# Patient Record
Sex: Female | Born: 1945 | Race: Black or African American | Hispanic: No | Smoking: Former smoker
Health system: Southern US, Community
[De-identification: ages and names within clinical notes are randomized; demographics above are authoritative.]

## PROBLEM LIST (undated history)

## (undated) DIAGNOSIS — I8393 Asymptomatic varicose veins of bilateral lower extremities: Secondary | ICD-10-CM

## (undated) DIAGNOSIS — I10 Essential (primary) hypertension: Secondary | ICD-10-CM

## (undated) DIAGNOSIS — W19XXXA Unspecified fall, initial encounter: Secondary | ICD-10-CM

## (undated) DIAGNOSIS — I7300 Raynaud's syndrome without gangrene: Secondary | ICD-10-CM

## (undated) DIAGNOSIS — M2550 Pain in unspecified joint: Secondary | ICD-10-CM

## (undated) DIAGNOSIS — E538 Deficiency of other specified B group vitamins: Secondary | ICD-10-CM

## (undated) DIAGNOSIS — M797 Fibromyalgia: Secondary | ICD-10-CM

## (undated) DIAGNOSIS — M549 Dorsalgia, unspecified: Secondary | ICD-10-CM

## (undated) DIAGNOSIS — E559 Vitamin D deficiency, unspecified: Secondary | ICD-10-CM

## (undated) DIAGNOSIS — G4730 Sleep apnea, unspecified: Secondary | ICD-10-CM

## (undated) DIAGNOSIS — R296 Repeated falls: Secondary | ICD-10-CM

## (undated) DIAGNOSIS — M2540 Effusion, unspecified joint: Secondary | ICD-10-CM

## (undated) DIAGNOSIS — M2560 Stiffness of unspecified joint, not elsewhere classified: Secondary | ICD-10-CM

## (undated) HISTORY — DX: Sleep apnea, unspecified: G47.30

## (undated) HISTORY — DX: Vitamin D deficiency, unspecified: E55.9

## (undated) HISTORY — DX: Pain in unspecified joint: M25.50

## (undated) HISTORY — DX: Effusion, unspecified joint: M25.40

## (undated) HISTORY — DX: Asymptomatic varicose veins of bilateral lower extremities: I83.93

## (undated) HISTORY — DX: Stiffness of unspecified joint, not elsewhere classified: M25.60

## (undated) HISTORY — PX: BREAST EXCISIONAL BIOPSY: SUR124

## (undated) HISTORY — DX: Dorsalgia, unspecified: M54.9

## (undated) HISTORY — DX: Repeated falls: R29.6

## (undated) HISTORY — DX: Fibromyalgia: M79.7

## (undated) HISTORY — DX: Deficiency of other specified B group vitamins: E53.8

## (undated) HISTORY — PX: FRACTURE SURGERY: SHX138

## (undated) HISTORY — PX: CARPAL TUNNEL RELEASE: SHX101

## (undated) HISTORY — PX: FOOT FRACTURE SURGERY: SHX645

## (undated) HISTORY — DX: Essential (primary) hypertension: I10

## (undated) HISTORY — DX: Unspecified fall, initial encounter: W19.XXXA

## (undated) HISTORY — PX: TUBAL LIGATION: SHX77

## (undated) HISTORY — DX: Raynaud's syndrome without gangrene: I73.00

## (undated) HISTORY — PX: BREAST BIOPSY: SHX20

---

## 1998-08-21 ENCOUNTER — Other Ambulatory Visit: Admission: RE | Admit: 1998-08-21 | Discharge: 1998-08-21 | Payer: Self-pay | Admitting: Obstetrics & Gynecology

## 1998-12-15 ENCOUNTER — Encounter: Payer: Self-pay | Admitting: Obstetrics & Gynecology

## 1998-12-15 ENCOUNTER — Ambulatory Visit (HOSPITAL_COMMUNITY): Admission: RE | Admit: 1998-12-15 | Discharge: 1998-12-15 | Payer: Self-pay | Admitting: Obstetrics & Gynecology

## 2000-08-01 ENCOUNTER — Other Ambulatory Visit: Admission: RE | Admit: 2000-08-01 | Discharge: 2000-08-01 | Payer: Self-pay | Admitting: Obstetrics & Gynecology

## 2000-08-13 ENCOUNTER — Encounter: Payer: Self-pay | Admitting: Obstetrics & Gynecology

## 2000-08-13 ENCOUNTER — Ambulatory Visit (HOSPITAL_COMMUNITY): Admission: RE | Admit: 2000-08-13 | Discharge: 2000-08-13 | Payer: Self-pay | Admitting: Obstetrics & Gynecology

## 2001-10-13 ENCOUNTER — Other Ambulatory Visit: Admission: RE | Admit: 2001-10-13 | Discharge: 2001-10-13 | Payer: Self-pay | Admitting: Obstetrics and Gynecology

## 2003-08-16 ENCOUNTER — Other Ambulatory Visit: Admission: RE | Admit: 2003-08-16 | Discharge: 2003-08-16 | Payer: Self-pay | Admitting: Obstetrics and Gynecology

## 2003-08-19 ENCOUNTER — Encounter: Payer: Self-pay | Admitting: Obstetrics and Gynecology

## 2003-08-19 ENCOUNTER — Ambulatory Visit (HOSPITAL_COMMUNITY): Admission: RE | Admit: 2003-08-19 | Discharge: 2003-08-19 | Payer: Self-pay | Admitting: Obstetrics and Gynecology

## 2003-09-20 ENCOUNTER — Encounter (INDEPENDENT_AMBULATORY_CARE_PROVIDER_SITE_OTHER): Payer: Self-pay

## 2003-09-20 ENCOUNTER — Ambulatory Visit (HOSPITAL_COMMUNITY): Admission: RE | Admit: 2003-09-20 | Discharge: 2003-09-20 | Payer: Self-pay | Admitting: Gastroenterology

## 2005-02-11 ENCOUNTER — Encounter: Admission: RE | Admit: 2005-02-11 | Discharge: 2005-02-11 | Payer: Self-pay | Admitting: Internal Medicine

## 2005-09-04 ENCOUNTER — Other Ambulatory Visit: Admission: RE | Admit: 2005-09-04 | Discharge: 2005-09-04 | Payer: Self-pay | Admitting: Obstetrics and Gynecology

## 2005-09-25 ENCOUNTER — Encounter: Admission: RE | Admit: 2005-09-25 | Discharge: 2005-09-25 | Payer: Self-pay | Admitting: Obstetrics and Gynecology

## 2005-11-18 HISTORY — PX: KNEE SURGERY: SHX244

## 2006-10-15 ENCOUNTER — Other Ambulatory Visit: Admission: RE | Admit: 2006-10-15 | Discharge: 2006-10-15 | Payer: Self-pay | Admitting: Obstetrics and Gynecology

## 2007-11-30 ENCOUNTER — Encounter: Admission: RE | Admit: 2007-11-30 | Discharge: 2007-11-30 | Payer: Self-pay | Admitting: Obstetrics and Gynecology

## 2009-06-27 ENCOUNTER — Encounter: Admission: RE | Admit: 2009-06-27 | Discharge: 2009-06-27 | Payer: Self-pay | Admitting: Orthopedic Surgery

## 2009-06-29 ENCOUNTER — Encounter: Admission: RE | Admit: 2009-06-29 | Discharge: 2009-06-29 | Payer: Self-pay | Admitting: Orthopedic Surgery

## 2010-12-09 ENCOUNTER — Encounter: Payer: Self-pay | Admitting: Obstetrics and Gynecology

## 2011-04-05 NOTE — Op Note (Signed)
[name], [name]                       ACCOUNT NO.:  [id number]   MEDICAL RECORD NO.:  [id number]                   PATIENT TYPE:  AMB   LOCATION:  ENDO                                 FACILITY:  [facility]   PHYSICIAN:  [name], M.D.                 DATE OF BIRTH:  Jan 12, 1946   DATE OF PROCEDURE:  09/20/2003  DATE OF DISCHARGE:                                 OPERATIVE REPORT   PROCEDURE:  Colonoscopy.   INDICATIONS:  Family history of colon polyps.  Due for colonic screening.  Consent was signed after risks, benefits, methods, and options were  thoroughly discussed in the office.   PREMEDICATIONS:  Demerol 80 mg, Versed 8 mg.   DESCRIPTION OF PROCEDURE:  Rectal inspection pertinent for external  hemorrhoids, small.  Digital exam was negative.  Video pediatric adjustable  colonoscope was inserted and easily advanced around the colon to the cecum.  This did require some abdominal pressure but no position changes.  No  obvious abnormality was seen on insertion.  In the cecum small pedunculated  polyp was seen and snared.  Electrocautery was applied and the polyp was  suctioned through the scope and collected in the trap.  Electrocautery was  set on 15/15.  We did advance a short ways into the terminal ileum which was  normal.  Scope was slowly withdrawn.  The prep was adequate.  There was some  liquid stool that required washing and suctioning.  On slow withdrawal  through the colon, another tiny distal sigmoid, probably hyperplastic-  appearing polyp, which was cold biopsied x1, and put in a separate  container.  No other abnormalities were seen. Anal rectal polyps on  retroflexion confirmed some small hemorrhoids.  The scope was reinserted a  short ways up the left side of the colon.  Air was suctioned and the scope  removed.  The patient tolerated the procedure well.  There was no obvious  immediate complication.   ENDOSCOPIC DIAGNOSES:  1. Internal and external  hemorrhoids.  2. Tiny distal sigmoid, probably hyperplastic-appearing, polyp cold     biopsied.  3. Cecal small polyp snared on a setting of 15/15.  4. Otherwise within normal limits to the end of the terminal ileum.    PLAN:  Await pathology to determine the future of colonic screening.  Happy  to see back p.r.n.  Otherwise return care to Dr. Renae Gordon and Dr. Pennie Gordon for  the customary health care screening and maintenance to include yearly  rectals and guaiacs.                                               [name], M.D.    MEM/MEDQ  D:  09/20/2003  T:  09/20/2003  Job:  [id number]   cc:  [name], M.D.  [address], [address]  [place]  [place] [address]  Fax: [phone]   [name]. [name], M.D.  [address]  [address]  [place]  [place] [address]  Fax: [phone]

## 2014-03-19 ENCOUNTER — Ambulatory Visit (INDEPENDENT_AMBULATORY_CARE_PROVIDER_SITE_OTHER): Payer: Medicare HMO | Admitting: Emergency Medicine

## 2014-03-19 VITALS — BP 125/95 | HR 72 | Temp 97.9°F | Resp 18

## 2014-03-19 DIAGNOSIS — L743 Miliaria, unspecified: Secondary | ICD-10-CM

## 2014-03-19 DIAGNOSIS — L740 Miliaria rubra: Secondary | ICD-10-CM

## 2014-03-19 MED ORDER — TRIAMCINOLONE ACETONIDE 0.1 % EX CREA: 1.0000 "application " | TOPICAL_CREAM | Freq: Two times a day (BID) | CUTANEOUS | Status: DC

## 2014-03-19 NOTE — Patient Instructions (Signed)
Sunburn Sunburn is damage to the skin caused by overexposure to ultraviolet (UV) rays. People with light skin or a fair complexion may be more susceptible to sunburn. Repeated sun exposure causes early skin aging such as wrinkles and sun spots. It also increases the risk of skin cancer. CAUSES A sunburn is caused by getting too much UV radiation from the sun. SYMPTOMS  Red or pink skin.  Soreness and swelling.  Pain.  Blisters.  Peeling skin.  Headache, fever, and fatigue if sunburn covers a large area. TREATMENT  Your caregiver may tell you to take certain medicines to lessen inflammation.  Your caregiver may have you use hydrocortisone cream or spray to help with itching and inflammation.  Your caregiver may prescribe an antibiotic cream to use on blisters. HOME CARE INSTRUCTIONS   Avoid further exposure to the sun.  Cool baths and cool compresses may be helpful if used several times per day. Do not apply ice, since this may result in more damage to the skin.  Only take over-the-counter or prescription medicines for pain, discomfort, or fever as directed by your caregiver.  Use aloe or other over-the-counter sunburn creams or gels on your skin. Do not apply these creams or gels on blisters.  Drink enough fluids to keep your urine clear or pale yellow.  Do not break blisters. If blisters break, your caregiver may recommend an antibiotic cream to apply to the affected area. PREVENTION   Try to avoid the sun between 10:00 a.m. and 4:00 p.m. when it is the strongest.  Apply sunscreen at least 30 minutes before exposure to the sun.  Always wear protective hats, clothing, and sunglasses with UV protection.  Avoid medicines, herbs, and foods that increase your sensitivity to sunlight.  Avoid tanning beds. SEEK IMMEDIATE MEDICAL CARE IF:   You have a fever.  Your pain is uncontrolled with medicine.  You start to vomit or have diarrhea.  You feel faint or develop a  headache with confusion.  You develop severe blistering.  You have a pus-like (purulent) discharge coming from the blisters.  Your burn becomes more painful and swollen. MAKE SURE YOU:  Understand these instructions.  Will watch your condition.  Will get help right away if you are not doing well or get worse. Document Released: 08/14/2005 Document Revised: 03/01/2013 Document Reviewed: 04/28/2011 ExitCare Patient Information 2014 [employer].  

## 2014-03-19 NOTE — Progress Notes (Signed)
[facility] [address] [place] [address] [phone]  Date:  03/19/2014   Name:  [name]   DOB:  Jul 09, 1946   MRN:  [id number]  PCP:  No primary provider on file.    Chief Complaint: Rash and PT REFUSED TO BE WEIGHED   History of Present Illness:  [name] is a 68 y.o. very pleasant female patient who presents with the following:  Just returned from [place] on vacation.  Pretty badly sunburned while there.  Now to office with a rash that she developed while walking today.  Initially vesicular and pruritic.  No respiratory symptoms. No history of prior atopy.  Improved with benadryl.  No improvement with over the counter medications or other home remedies. Denies other complaint or health concern today.   There are no active problems to display for this patient.   History reviewed. No pertinent past medical history.  Past Surgical History  Procedure Laterality Date  . Tubal ligation      History  Substance Use Topics  . Smoking status: Former Research scientist (life sciences)  . Smokeless tobacco: Not on file  . Alcohol Use: Yes    Family History  Problem Relation Age of Onset  . Hypertension Mother   . Cancer Father   . Diabetes Brother     No Known Allergies  Medication list has been reviewed and updated.  No current outpatient prescriptions on file prior to visit.   No current facility-administered medications on file prior to visit.    Review of Systems:  As per HPI, otherwise negative.    Physical Examination: Filed Vitals:   03/19/14 1549  BP: 125/95  Pulse: 72  Temp: 97.9 F (36.6 C)  Resp: 18   There were no vitals filed for this visit. There is no height or weight on file to calculate BMI. Ideal Body Weight:     GEN: WDWN, NAD, Non-toxic, Alert & Oriented x 3 HEENT: Atraumatic, Normocephalic.  Ears and Nose: No external deformity. EXTR: No clubbing/cyanosis/edema NEURO: Normal gait.  PSYCH: Normally  interactive. Conversant. Not depressed or anxious appearing.  Calm demeanor.  SKIN:  Resolving sunburn shoulders and a fine vesicular eruption of discrete small lesions.     Assessment and Plan: Miliaria TAC Benadryl   Signed,  Ellison Carwin, MD

## 2015-07-12 ENCOUNTER — Other Ambulatory Visit: Payer: Self-pay

## 2015-07-12 ENCOUNTER — Other Ambulatory Visit: Payer: Self-pay | Admitting: Family

## 2015-07-12 ENCOUNTER — Ambulatory Visit
Admission: RE | Admit: 2015-07-12 | Discharge: 2015-07-12 | Disposition: A | Discharge status: Home / Self Care | Payer: Medicare HMO | Source: Ambulatory Visit | Attending: Family | Admitting: Family

## 2015-07-12 DIAGNOSIS — Z1231 Encounter for screening mammogram for malignant neoplasm of breast: Secondary | ICD-10-CM

## 2015-07-12 DIAGNOSIS — M25441 Effusion, right hand: Secondary | ICD-10-CM

## 2015-08-16 ENCOUNTER — Ambulatory Visit: Payer: Self-pay

## 2015-09-06 ENCOUNTER — Ambulatory Visit: Payer: Self-pay

## 2015-09-06 ENCOUNTER — Ambulatory Visit
Admission: RE | Admit: 2015-09-06 | Discharge: 2015-09-06 | Disposition: A | Discharge status: Home / Self Care | Payer: Medicare HMO | Source: Ambulatory Visit

## 2015-09-06 DIAGNOSIS — Z1231 Encounter for screening mammogram for malignant neoplasm of breast: Secondary | ICD-10-CM

## 2015-09-21 DIAGNOSIS — Z008 Encounter for other general examination: Secondary | ICD-10-CM | POA: Diagnosis not present

## 2015-12-18 DIAGNOSIS — E6609 Other obesity due to excess calories: Secondary | ICD-10-CM | POA: Diagnosis not present

## 2015-12-18 DIAGNOSIS — Z6832 Body mass index (BMI) 32.0-32.9, adult: Secondary | ICD-10-CM | POA: Diagnosis not present

## 2015-12-18 DIAGNOSIS — L298 Other pruritus: Secondary | ICD-10-CM | POA: Diagnosis not present

## 2015-12-18 DIAGNOSIS — H01111 Allergic dermatitis of right upper eyelid: Secondary | ICD-10-CM | POA: Diagnosis not present

## 2016-01-15 DIAGNOSIS — Z23 Encounter for immunization: Secondary | ICD-10-CM | POA: Diagnosis not present

## 2016-01-15 DIAGNOSIS — R030 Elevated blood-pressure reading, without diagnosis of hypertension: Secondary | ICD-10-CM | POA: Diagnosis not present

## 2016-01-15 DIAGNOSIS — R635 Abnormal weight gain: Secondary | ICD-10-CM | POA: Diagnosis not present

## 2016-01-15 DIAGNOSIS — Z79899 Other long term (current) drug therapy: Secondary | ICD-10-CM | POA: Diagnosis not present

## 2016-01-25 DIAGNOSIS — L821 Other seborrheic keratosis: Secondary | ICD-10-CM | POA: Diagnosis not present

## 2016-01-25 DIAGNOSIS — H019 Unspecified inflammation of eyelid: Secondary | ICD-10-CM | POA: Diagnosis not present

## 2016-01-25 DIAGNOSIS — L309 Dermatitis, unspecified: Secondary | ICD-10-CM | POA: Diagnosis not present

## 2016-01-25 DIAGNOSIS — H578 Other specified disorders of eye and adnexa: Secondary | ICD-10-CM | POA: Diagnosis not present

## 2016-05-26 DIAGNOSIS — L821 Other seborrheic keratosis: Secondary | ICD-10-CM | POA: Insufficient documentation

## 2016-09-25 DIAGNOSIS — I10 Essential (primary) hypertension: Secondary | ICD-10-CM | POA: Diagnosis not present

## 2016-09-25 DIAGNOSIS — G43909 Migraine, unspecified, not intractable, without status migrainosus: Secondary | ICD-10-CM | POA: Diagnosis not present

## 2016-09-30 DIAGNOSIS — R030 Elevated blood-pressure reading, without diagnosis of hypertension: Secondary | ICD-10-CM | POA: Diagnosis not present

## 2016-09-30 DIAGNOSIS — M25512 Pain in left shoulder: Secondary | ICD-10-CM | POA: Diagnosis not present

## 2016-09-30 DIAGNOSIS — M79641 Pain in right hand: Secondary | ICD-10-CM | POA: Diagnosis not present

## 2016-12-06 DIAGNOSIS — R202 Paresthesia of skin: Secondary | ICD-10-CM | POA: Diagnosis not present

## 2016-12-06 DIAGNOSIS — M25512 Pain in left shoulder: Secondary | ICD-10-CM | POA: Diagnosis not present

## 2016-12-06 DIAGNOSIS — M25531 Pain in right wrist: Secondary | ICD-10-CM | POA: Diagnosis not present

## 2016-12-06 DIAGNOSIS — R030 Elevated blood-pressure reading, without diagnosis of hypertension: Secondary | ICD-10-CM | POA: Diagnosis not present

## 2016-12-25 DIAGNOSIS — M25531 Pain in right wrist: Secondary | ICD-10-CM | POA: Diagnosis not present

## 2016-12-30 DIAGNOSIS — G629 Polyneuropathy, unspecified: Secondary | ICD-10-CM | POA: Diagnosis not present

## 2016-12-30 DIAGNOSIS — I10 Essential (primary) hypertension: Secondary | ICD-10-CM | POA: Diagnosis not present

## 2016-12-30 DIAGNOSIS — H2511 Age-related nuclear cataract, right eye: Secondary | ICD-10-CM | POA: Diagnosis not present

## 2016-12-30 DIAGNOSIS — Z0000 Encounter for general adult medical examination without abnormal findings: Secondary | ICD-10-CM | POA: Diagnosis not present

## 2017-05-05 DIAGNOSIS — R7309 Other abnormal glucose: Secondary | ICD-10-CM | POA: Diagnosis not present

## 2017-05-05 DIAGNOSIS — E2839 Other primary ovarian failure: Secondary | ICD-10-CM | POA: Diagnosis not present

## 2017-05-05 DIAGNOSIS — Z1389 Encounter for screening for other disorder: Secondary | ICD-10-CM | POA: Diagnosis not present

## 2017-05-05 DIAGNOSIS — Z01419 Encounter for gynecological examination (general) (routine) without abnormal findings: Secondary | ICD-10-CM | POA: Diagnosis not present

## 2017-05-05 DIAGNOSIS — E559 Vitamin D deficiency, unspecified: Secondary | ICD-10-CM | POA: Diagnosis not present

## 2017-05-05 DIAGNOSIS — Z0000 Encounter for general adult medical examination without abnormal findings: Secondary | ICD-10-CM | POA: Diagnosis not present

## 2017-05-05 DIAGNOSIS — M2550 Pain in unspecified joint: Secondary | ICD-10-CM | POA: Diagnosis not present

## 2017-05-05 DIAGNOSIS — Z1212 Encounter for screening for malignant neoplasm of rectum: Secondary | ICD-10-CM | POA: Diagnosis not present

## 2017-05-09 ENCOUNTER — Other Ambulatory Visit: Payer: Self-pay | Admitting: Internal Medicine

## 2017-05-09 DIAGNOSIS — E2839 Other primary ovarian failure: Secondary | ICD-10-CM

## 2017-05-09 DIAGNOSIS — Z1231 Encounter for screening mammogram for malignant neoplasm of breast: Secondary | ICD-10-CM

## 2017-05-16 ENCOUNTER — Telehealth (HOSPITAL_COMMUNITY): Payer: Self-pay | Admitting: *Deleted

## 2017-05-20 ENCOUNTER — Ambulatory Visit
Admission: RE | Admit: 2017-05-20 | Discharge: 2017-05-20 | Disposition: A | Discharge status: Home / Self Care | Payer: Medicare HMO | Source: Ambulatory Visit | Attending: Internal Medicine | Admitting: Internal Medicine

## 2017-05-20 DIAGNOSIS — M85852 Other specified disorders of bone density and structure, left thigh: Secondary | ICD-10-CM | POA: Diagnosis not present

## 2017-05-20 DIAGNOSIS — E2839 Other primary ovarian failure: Secondary | ICD-10-CM

## 2017-05-20 DIAGNOSIS — Z1231 Encounter for screening mammogram for malignant neoplasm of breast: Secondary | ICD-10-CM | POA: Diagnosis not present

## 2017-05-20 DIAGNOSIS — Z780 Asymptomatic menopausal state: Secondary | ICD-10-CM | POA: Diagnosis not present

## 2017-05-26 ENCOUNTER — Other Ambulatory Visit: Payer: Self-pay

## 2017-05-26 DIAGNOSIS — I83813 Varicose veins of bilateral lower extremities with pain: Secondary | ICD-10-CM

## 2017-05-28 DIAGNOSIS — M79673 Pain in unspecified foot: Secondary | ICD-10-CM | POA: Diagnosis not present

## 2017-05-28 DIAGNOSIS — E2839 Other primary ovarian failure: Secondary | ICD-10-CM | POA: Diagnosis not present

## 2017-05-28 DIAGNOSIS — E559 Vitamin D deficiency, unspecified: Secondary | ICD-10-CM | POA: Diagnosis not present

## 2017-05-28 DIAGNOSIS — M5432 Sciatica, left side: Secondary | ICD-10-CM | POA: Diagnosis not present

## 2017-06-05 DIAGNOSIS — Z86010 Personal history of colonic polyps: Secondary | ICD-10-CM | POA: Diagnosis not present

## 2017-06-05 DIAGNOSIS — D126 Benign neoplasm of colon, unspecified: Secondary | ICD-10-CM | POA: Diagnosis not present

## 2017-06-06 ENCOUNTER — Encounter: Payer: Self-pay | Admitting: Vascular Surgery

## 2017-06-09 DIAGNOSIS — D126 Benign neoplasm of colon, unspecified: Secondary | ICD-10-CM | POA: Diagnosis not present

## 2017-06-10 ENCOUNTER — Ambulatory Visit (HOSPITAL_COMMUNITY)
Admission: RE | Admit: 2017-06-10 | Discharge: 2017-06-10 | Disposition: A | Discharge status: Home / Self Care | Payer: Medicare HMO | Source: Ambulatory Visit | Attending: Vascular Surgery | Admitting: Vascular Surgery

## 2017-06-10 DIAGNOSIS — I8391 Asymptomatic varicose veins of right lower extremity: Secondary | ICD-10-CM | POA: Diagnosis not present

## 2017-06-10 DIAGNOSIS — I83813 Varicose veins of bilateral lower extremities with pain: Secondary | ICD-10-CM | POA: Diagnosis not present

## 2017-06-16 ENCOUNTER — Encounter: Payer: Self-pay | Admitting: Vascular Surgery

## 2017-06-16 ENCOUNTER — Ambulatory Visit (INDEPENDENT_AMBULATORY_CARE_PROVIDER_SITE_OTHER): Payer: Medicare HMO | Admitting: Vascular Surgery

## 2017-06-16 VITALS — BP 133/82 | HR 77 | Temp 97.3°F | Resp 16

## 2017-06-16 DIAGNOSIS — I83893 Varicose veins of bilateral lower extremities with other complications: Secondary | ICD-10-CM

## 2017-06-16 NOTE — Progress Notes (Signed)
Subjective:     Patient ID: [name], female   DOB: 05-09-1946, 71 y.o.   MRN: [id number]  HPI This 71 year old female was referred by Dr. Bryon Lions for evaluation of bilateral varicose veins. The patient has had "spider veins" for many years which have become slightly more extensive. She has areas of hypersensitivity particularly in the left distal lateral leg. She did suffer a significant ankle and foot fracture in this area many years ago. She has no history of known DVT thrombophlebitis stasis ulcers or bleeding. She does develop some mild swelling as the day progresses. She also develops aching discomfort in both legs in both legs at night are restless. She does not elastic compression stockings.  Past Medical History:  Diagnosis Date  . Varicose veins of both lower extremities     Social History  Substance Use Topics  . Smoking status: Former Research scientist (life sciences)  . Smokeless tobacco: Never Used  . Alcohol use Yes    Family History  Problem Relation Age of Onset  . Hypertension Mother   . Cancer Father   . Diabetes Brother   . Breast cancer Paternal Aunt   . Breast cancer Paternal Grandmother     No Known Allergies   Current Outpatient Prescriptions:  .  triamcinolone cream (KENALOG) 0.1 %, Apply 1 application topically 2 (two) times daily., Disp: 60 g, Rfl: 0  Vitals:   06/16/17 1016  BP: 133/82  Pulse: 77  Resp: 16  Temp: (!) 97.3 F (36.3 C)  SpO2: 99%    There is no height or weight on file to calculate BMI.         Review of Systems Denies chest pain, dyspnea on exertion, PND, orthopnea, hemoptysis, claudication.    Objective:   Physical Exam BP 133/82 (BP Location: Left Arm, Patient Position: Sitting, Cuff Size: Large)   Pulse 77   Temp (!) 97.3 F (36.3 C)   Resp 16   SpO2 99%     Gen.-alert and oriented x3 in no apparent distress HEENT normal for age Lungs no rhonchi or wheezing Cardiovascular regular rhythm no murmurs carotid pulses 3+  palpable no bruits audible Abdomen soft nontender no palpable masses Musculoskeletal free of  major deformities Skin clear -no rashes Neurologic normal Lower extremities 3+ femoral and dorsalis pedis pulses palpable bilaterally with no edema Scattered spider veins bilaterally particularly in the distal lateral and posterior thigh areas and in the popliteal space. No hyperpigmentation or ulceration noted distally. No bulging varicosities noted.  Today I reviewed the ultrasound study performed in our office which was a reflux examination on 06/10/2017. There is no DVT or deep vein reflux. The superficial system looks essentially normal.       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 I discussed this at length with the patient If she is interested in foam sclerotherapy for the spider veins she will contact Ursula Beath gave her contact information to the patient today Otherwise return on a when necessary basis

## 2017-09-05 DIAGNOSIS — M25532 Pain in left wrist: Secondary | ICD-10-CM | POA: Diagnosis not present

## 2017-09-05 DIAGNOSIS — M25512 Pain in left shoulder: Secondary | ICD-10-CM | POA: Diagnosis not present

## 2017-09-05 DIAGNOSIS — M25571 Pain in right ankle and joints of right foot: Secondary | ICD-10-CM | POA: Diagnosis not present

## 2017-09-05 DIAGNOSIS — M25562 Pain in left knee: Secondary | ICD-10-CM | POA: Diagnosis not present

## 2017-09-05 DIAGNOSIS — M25572 Pain in left ankle and joints of left foot: Secondary | ICD-10-CM | POA: Diagnosis not present

## 2017-09-05 DIAGNOSIS — M79602 Pain in left arm: Secondary | ICD-10-CM | POA: Diagnosis not present

## 2017-09-05 DIAGNOSIS — M25561 Pain in right knee: Secondary | ICD-10-CM | POA: Diagnosis not present

## 2017-09-06 DIAGNOSIS — S5002XA Contusion of left elbow, initial encounter: Secondary | ICD-10-CM | POA: Diagnosis not present

## 2017-09-06 DIAGNOSIS — S82831A Other fracture of upper and lower end of right fibula, initial encounter for closed fracture: Secondary | ICD-10-CM | POA: Diagnosis not present

## 2017-09-08 DIAGNOSIS — M545 Low back pain: Secondary | ICD-10-CM | POA: Diagnosis not present

## 2017-09-08 DIAGNOSIS — M25562 Pain in left knee: Secondary | ICD-10-CM | POA: Diagnosis not present

## 2017-09-08 DIAGNOSIS — S82831A Other fracture of upper and lower end of right fibula, initial encounter for closed fracture: Secondary | ICD-10-CM | POA: Diagnosis not present

## 2017-09-08 DIAGNOSIS — M25561 Pain in right knee: Secondary | ICD-10-CM | POA: Diagnosis not present

## 2017-09-10 DIAGNOSIS — S8291XS Unspecified fracture of right lower leg, sequela: Secondary | ICD-10-CM | POA: Diagnosis not present

## 2017-09-10 DIAGNOSIS — M25562 Pain in left knee: Secondary | ICD-10-CM | POA: Diagnosis not present

## 2017-09-10 DIAGNOSIS — M25512 Pain in left shoulder: Secondary | ICD-10-CM | POA: Diagnosis not present

## 2017-09-10 DIAGNOSIS — M25571 Pain in right ankle and joints of right foot: Secondary | ICD-10-CM | POA: Diagnosis not present

## 2017-09-15 DIAGNOSIS — M25512 Pain in left shoulder: Secondary | ICD-10-CM | POA: Diagnosis not present

## 2017-09-15 DIAGNOSIS — M545 Low back pain: Secondary | ICD-10-CM | POA: Diagnosis not present

## 2017-09-15 DIAGNOSIS — M25562 Pain in left knee: Secondary | ICD-10-CM | POA: Diagnosis not present

## 2017-09-15 DIAGNOSIS — S82831A Other fracture of upper and lower end of right fibula, initial encounter for closed fracture: Secondary | ICD-10-CM | POA: Diagnosis not present

## 2017-09-15 DIAGNOSIS — M25561 Pain in right knee: Secondary | ICD-10-CM | POA: Diagnosis not present

## 2017-09-16 DIAGNOSIS — M25512 Pain in left shoulder: Secondary | ICD-10-CM | POA: Diagnosis not present

## 2017-09-16 DIAGNOSIS — M6281 Muscle weakness (generalized): Secondary | ICD-10-CM | POA: Diagnosis not present

## 2017-09-16 DIAGNOSIS — M25612 Stiffness of left shoulder, not elsewhere classified: Secondary | ICD-10-CM | POA: Diagnosis not present

## 2017-09-22 NOTE — Telephone Encounter (Signed)
error 

## 2017-09-29 DIAGNOSIS — S82831D Other fracture of upper and lower end of right fibula, subsequent encounter for closed fracture with routine healing: Secondary | ICD-10-CM | POA: Diagnosis not present

## 2017-09-30 DIAGNOSIS — M25612 Stiffness of left shoulder, not elsewhere classified: Secondary | ICD-10-CM | POA: Diagnosis not present

## 2017-09-30 DIAGNOSIS — M25512 Pain in left shoulder: Secondary | ICD-10-CM | POA: Diagnosis not present

## 2017-09-30 DIAGNOSIS — M6281 Muscle weakness (generalized): Secondary | ICD-10-CM | POA: Diagnosis not present

## 2017-10-01 DIAGNOSIS — H524 Presbyopia: Secondary | ICD-10-CM | POA: Diagnosis not present

## 2017-10-08 DIAGNOSIS — M25512 Pain in left shoulder: Secondary | ICD-10-CM | POA: Diagnosis not present

## 2017-10-08 DIAGNOSIS — M25612 Stiffness of left shoulder, not elsewhere classified: Secondary | ICD-10-CM | POA: Diagnosis not present

## 2017-10-08 DIAGNOSIS — M6281 Muscle weakness (generalized): Secondary | ICD-10-CM | POA: Diagnosis not present

## 2017-10-28 DIAGNOSIS — M2550 Pain in unspecified joint: Secondary | ICD-10-CM | POA: Diagnosis not present

## 2017-10-28 DIAGNOSIS — M542 Cervicalgia: Secondary | ICD-10-CM | POA: Diagnosis not present

## 2017-10-28 DIAGNOSIS — R030 Elevated blood-pressure reading, without diagnosis of hypertension: Secondary | ICD-10-CM | POA: Diagnosis not present

## 2017-10-28 DIAGNOSIS — L0291 Cutaneous abscess, unspecified: Secondary | ICD-10-CM | POA: Diagnosis not present

## 2017-10-30 DIAGNOSIS — S52135D Nondisplaced fracture of neck of left radius, subsequent encounter for closed fracture with routine healing: Secondary | ICD-10-CM | POA: Diagnosis not present

## 2017-10-30 DIAGNOSIS — M25571 Pain in right ankle and joints of right foot: Secondary | ICD-10-CM | POA: Diagnosis not present

## 2017-11-05 DIAGNOSIS — M25512 Pain in left shoulder: Secondary | ICD-10-CM | POA: Diagnosis not present

## 2017-11-05 DIAGNOSIS — M25622 Stiffness of left elbow, not elsewhere classified: Secondary | ICD-10-CM | POA: Diagnosis not present

## 2017-11-05 DIAGNOSIS — S52182D Other fracture of upper end of left radius, subsequent encounter for closed fracture with routine healing: Secondary | ICD-10-CM | POA: Diagnosis not present

## 2017-11-05 DIAGNOSIS — M25522 Pain in left elbow: Secondary | ICD-10-CM | POA: Diagnosis not present

## 2017-11-19 DIAGNOSIS — M25622 Stiffness of left elbow, not elsewhere classified: Secondary | ICD-10-CM | POA: Diagnosis not present

## 2017-11-19 DIAGNOSIS — M25522 Pain in left elbow: Secondary | ICD-10-CM | POA: Diagnosis not present

## 2017-11-19 DIAGNOSIS — S52182D Other fracture of upper end of left radius, subsequent encounter for closed fracture with routine healing: Secondary | ICD-10-CM | POA: Diagnosis not present

## 2017-11-19 DIAGNOSIS — M25512 Pain in left shoulder: Secondary | ICD-10-CM | POA: Diagnosis not present

## 2017-11-27 DIAGNOSIS — M25512 Pain in left shoulder: Secondary | ICD-10-CM | POA: Diagnosis not present

## 2017-11-27 DIAGNOSIS — S52182D Other fracture of upper end of left radius, subsequent encounter for closed fracture with routine healing: Secondary | ICD-10-CM | POA: Diagnosis not present

## 2017-11-27 DIAGNOSIS — S52135D Nondisplaced fracture of neck of left radius, subsequent encounter for closed fracture with routine healing: Secondary | ICD-10-CM | POA: Diagnosis not present

## 2017-11-27 DIAGNOSIS — M25522 Pain in left elbow: Secondary | ICD-10-CM | POA: Diagnosis not present

## 2017-11-27 DIAGNOSIS — M25622 Stiffness of left elbow, not elsewhere classified: Secondary | ICD-10-CM | POA: Diagnosis not present

## 2017-12-01 DIAGNOSIS — I10 Essential (primary) hypertension: Secondary | ICD-10-CM | POA: Diagnosis not present

## 2017-12-01 DIAGNOSIS — M25571 Pain in right ankle and joints of right foot: Secondary | ICD-10-CM | POA: Diagnosis not present

## 2017-12-01 DIAGNOSIS — G4700 Insomnia, unspecified: Secondary | ICD-10-CM | POA: Diagnosis not present

## 2017-12-01 DIAGNOSIS — M779 Enthesopathy, unspecified: Secondary | ICD-10-CM | POA: Diagnosis not present

## 2017-12-25 DIAGNOSIS — M25572 Pain in left ankle and joints of left foot: Secondary | ICD-10-CM | POA: Diagnosis not present

## 2017-12-25 DIAGNOSIS — M25522 Pain in left elbow: Secondary | ICD-10-CM | POA: Diagnosis not present

## 2017-12-30 DIAGNOSIS — Z79899 Other long term (current) drug therapy: Secondary | ICD-10-CM | POA: Diagnosis not present

## 2018-01-09 DIAGNOSIS — M2042 Other hammer toe(s) (acquired), left foot: Secondary | ICD-10-CM | POA: Diagnosis not present

## 2018-01-09 DIAGNOSIS — M67472 Ganglion, left ankle and foot: Secondary | ICD-10-CM | POA: Diagnosis not present

## 2018-01-09 DIAGNOSIS — M2041 Other hammer toe(s) (acquired), right foot: Secondary | ICD-10-CM | POA: Diagnosis not present

## 2018-01-09 DIAGNOSIS — M67471 Ganglion, right ankle and foot: Secondary | ICD-10-CM | POA: Diagnosis not present

## 2018-01-22 ENCOUNTER — Encounter: Payer: Self-pay | Admitting: Neurology

## 2018-01-22 ENCOUNTER — Ambulatory Visit: Payer: Medicare HMO | Admitting: Neurology

## 2018-01-22 ENCOUNTER — Encounter (INDEPENDENT_AMBULATORY_CARE_PROVIDER_SITE_OTHER): Payer: Self-pay

## 2018-01-22 VITALS — BP 150/96 | HR 76 | Ht 67.0 in | Wt 245.0 lb

## 2018-01-22 DIAGNOSIS — R0683 Snoring: Secondary | ICD-10-CM

## 2018-01-22 DIAGNOSIS — G4730 Sleep apnea, unspecified: Secondary | ICD-10-CM

## 2018-01-22 DIAGNOSIS — R351 Nocturia: Secondary | ICD-10-CM | POA: Diagnosis not present

## 2018-01-22 DIAGNOSIS — E669 Obesity, unspecified: Secondary | ICD-10-CM | POA: Diagnosis not present

## 2018-01-22 DIAGNOSIS — G4710 Hypersomnia, unspecified: Secondary | ICD-10-CM | POA: Diagnosis not present

## 2018-01-22 DIAGNOSIS — M25522 Pain in left elbow: Secondary | ICD-10-CM | POA: Diagnosis not present

## 2018-01-22 DIAGNOSIS — M67912 Unspecified disorder of synovium and tendon, left shoulder: Secondary | ICD-10-CM | POA: Diagnosis not present

## 2018-01-22 NOTE — Patient Instructions (Signed)

## 2018-01-22 NOTE — Progress Notes (Signed)
SLEEP MEDICINE CLINIC   Provider:  Larey Seat, M D  Primary Care Physician:  Glendale Chard, MD   Referring Provider: Glendale Chard, MD    Chief Complaint  Patient presents with  . New Patient (Initial Visit)    pt alone, rm 11 pt is not sleeping through the night she wakes up 2-3 to pee. pt has been snores.     HPI:  [name] is a 72 y.o. female , seen here in a referral from Dr. Baird Cancer for a sleep consult.   She has never undergone a sleep study, and in her last visit with Dr. Baird Cancer mentioned poor sleep quality,  Very loud snoring that has woken her up, and frequent nocturia. Gained weight over the last 6 month, after a fall in October which injured ankle and elbow- and kept her from exercising. She used to walk 5 miles a day .  She has HTN.    Chief complaint according to patient : "[name] reports that her grandson has confirmed that she snores loudly, and he has witnessed this.  She is also concerned about her frequent bathroom breaks.  She has insomnia now,which is a rather new phenomenon.  Sleep habits are as follows: The patient watches the news in her living room she does not have a TV in her bedroom, after the late diagnosis she will be treated to her bedroom at the time may vary between 11 and midnight.  Her bedroom is otherwise cool, quiet and dark.  She starts out on her right side but may find herself in supine position during the night.  She sleeps on one pillow for head support, on a flat mattress. She estimates that she will be up for the first time to go to the bathroom within 2 hours of sleep onset.  She has turned the clock away from view  in her bedroom. She has 3 more bathroom breaks. She recently has begun taking meloxicam and tramadol for her orthopedic injuries, and she has noticed that she is not as easy to wake up at 7 AM as it was her happened before.  She used to wake up spontaneously at the same time every morning now she may sleep  until 8.30 on weekends. She is still in pain.   Sleep medical history and family sleep history:  She fell on the pedestrian sidewalk in downtown [place] , [address], when she fell over an unmarked hose that was dawn over the sidewalk, covered by plywood.  Social history: divorced, adult children- 1 grand son age 8. College educated, Armed forces operational officer.   She works as a Engineer, site. Nonsmoker- smoked for 2 years while in college. ETOH- socially- 2-4 drinks weekends. Caffeine; coffee daily - 2 mugs a day in AM, soda none, tea - herbal, no energy drinks.    Review of Systems: Out of a complete 14 system review, the patient complains of only the following symptoms, and all other reviewed systems are negative. some sudden sleepiness attacks- while driving. Before medication. Snoring- muscle relaxant and tramadol.   Epworth score  4, no naps. , Fatigue severity score 15  , depression score 2/ 15   Social History   Socioeconomic History  . Marital status: Single    Spouse name: Not on file  . Number of children: Not on file  . Years of education: Not on file  . Highest education level: Not on file  Social Needs  . Financial resource strain: Not on  file  . Food insecurity - worry: Not on file  . Food insecurity - inability: Not on file  . Transportation needs - medical: Not on file  . Transportation needs - non-medical: Not on file  Occupational History  . Not on file  Tobacco Use  . Smoking status: Former Research scientist (life sciences)  . Smokeless tobacco: Never Used  . Tobacco comment: quit at age 13,only smoked for 1-2 years  Substance and Sexual Activity  . Alcohol use: Yes    Comment: occasional  . Drug use: No  . Sexual activity: Not on file  Other Topics Concern  . Not on file  Social History Narrative  . Not on file    Family History  Problem Relation Age of Onset  . Hypertension Mother   . Hypertension Father   . Cancer Father   . Diabetes Brother   . Breast cancer Paternal  Aunt   . Breast cancer Paternal Grandmother     Past Medical History:  Diagnosis Date  . Falls   . Hypertension   . Varicose veins of both lower extremities   . Vitamin D deficiency     Past Surgical History:  Procedure Laterality Date  . BREAST BIOPSY Right   . BREAST EXCISIONAL BIOPSY Right   . FOOT FRACTURE SURGERY Right   . TUBAL LIGATION      Current Outpatient Medications  Medication Sig Dispense Refill  . meloxicam (MOBIC) 15 MG tablet Take 15 mg by mouth daily.    . traMADol (ULTRAM) 50 MG tablet Take by mouth every 6 (six) hours as needed.    . triamterene-hydrochlorothiazide (MAXZIDE-25) 37.5-25 MG tablet Take 1 tablet by mouth daily.     No current facility-administered medications for this visit.     Allergies as of 01/22/2018  . (No Known Allergies)    Vitals: BP (!) 150/96   Pulse 76   Ht 5\' 7"  (1.702 m)   Wt 245 lb (111.1 kg)   BMI 38.37 kg/m  Last Weight:  Wt Readings from Last 1 Encounters:  01/22/18 245 lb (111.1 kg)   WRU:EAVW mass index is 38.37 kg/m.     Last Height:   Ht Readings from Last 1 Encounters:  01/22/18 5\' 7"  (1.702 m)    Physical exam:  General: The patient is awake, alert and appears not in acute distress. The patient is well groomed. Head: Normocephalic, atraumatic. Neck is supple. Mallampati  4 neck circumference:15.5 . Nasal airflow patent , postnasal drip, Retrognathia is not seen.  Cardiovascular:  Regular rate and rhythm , without  murmurs or carotid bruit, and without distended neck veins. Respiratory: Lungs are clear to auscultation. Skin:  Without evidence of edema, or rash Trunk: BMI is 38. The patient's posture is erect.   Neurologic exam : The patient is awake and alert, oriented to place and time.   Memory subjective described as intact. Attention span & concentration ability appears normal.  Speech is fluent,  without dysarthria, dysphonia or aphasia.  Mood and affect are appropriate.  Cranial  nerves: Pupils are equal and briskly reactive to light. Funduscopic exam without evidence of pallor or edema.  Extraocular movements in vertical and horizontal planes intact and without nystagmus. Visual fields by finger perimetry are intact. Hearing to finger rub intact.  Facial sensation intact to fine touch. Facial motor strength is symmetric and tongue and uvula move midline. Shoulder shrug was symmetrical.   Motor exam:  Normal tone, muscle bulk and symmetric strength in all extremities.  Her left shoulder is droopy, and her left hand swelling.  Sensory:  Fine touch, pinprick and vibration were normal- Proprioception tested in the upper extremities was normal. Coordination: Rapid alternating movements / Finger-to-nose maneuver normal without evidence of ataxia, dysmetria or tremor. Gait and station: Patient walks without assistive device and is able unassisted to climb up to the exam table. Strength within normal limits.  Stance is stable and normal. Tandem gait is unfragmented. Turns with 3-4 Steps.  Deep tendon reflexes: in the upper and lower extremities are atenuated in both legs, but upper extremities DTR's are symmetric and intact. Babinski maneuver response is downgoing.   Assessment:  After physical and neurologic examination, review of laboratory studies,  Personal review of imaging studies, reports of other /same  Imaging studies, results of polysomnography and / or neurophysiology testing and pre-existing records as far as provided in visit., my assessment is   1)  Snoring and apnea witnessed by family. Worse since 6 month ago when she gained weight,  accentuated now with more hypersomnia as she started flexaril, meloxicam and Tramadol. - SPLIT study with these.  2)  Sleep hygiene. Keep all blue light and screens out of the bedroom. All electronics are off 30 minutes before bedtime.   3) establish a routine - a sleep ritual.   The patient was advised of the nature of the  diagnosed disorder , the treatment options and the  risks for general health and wellness arising from not treating the condition.   I spent more than 45 minutes of face to face time with the patient.  Greater than 50% of time was spent in counseling and coordination of care. We have discussed the diagnosis and differential and I answered the patient's questions.    Plan:  Treatment plan and additional workup : SPLIT at AHI 20/hr.     Larey Seat, MD 02/20/8184, 6:31 AM  Certified in Neurology by ABPN Certified in [place] by [facility] [address] [place], [place] [address]

## 2018-01-26 ENCOUNTER — Telehealth: Payer: Self-pay

## 2018-01-26 ENCOUNTER — Other Ambulatory Visit: Payer: Self-pay | Admitting: Neurology

## 2018-01-26 DIAGNOSIS — T481X5A Adverse effect of skeletal muscle relaxants [neuromuscular blocking agents], initial encounter: Secondary | ICD-10-CM

## 2018-01-26 DIAGNOSIS — R0683 Snoring: Secondary | ICD-10-CM

## 2018-01-26 NOTE — Telephone Encounter (Signed)
[employer] denied in lab sleep study, need HST order

## 2018-01-26 NOTE — Telephone Encounter (Signed)
Order placed

## 2018-01-26 NOTE — Telephone Encounter (Signed)
patient on muscle relaxants and low dose opiates does that change anything? CD

## 2018-01-27 NOTE — Telephone Encounter (Signed)
Not with Christella Scheuermann unfortunately

## 2018-01-27 NOTE — Telephone Encounter (Signed)
Due to [employer] denial of attended sleep study will order HST. CD

## 2018-01-29 DIAGNOSIS — M25522 Pain in left elbow: Secondary | ICD-10-CM | POA: Diagnosis not present

## 2018-01-29 DIAGNOSIS — S52135G Nondisplaced fracture of neck of left radius, subsequent encounter for closed fracture with delayed healing: Secondary | ICD-10-CM | POA: Diagnosis not present

## 2018-01-29 DIAGNOSIS — M25622 Stiffness of left elbow, not elsewhere classified: Secondary | ICD-10-CM | POA: Diagnosis not present

## 2018-01-29 DIAGNOSIS — M7542 Impingement syndrome of left shoulder: Secondary | ICD-10-CM | POA: Diagnosis not present

## 2018-02-11 ENCOUNTER — Ambulatory Visit (INDEPENDENT_AMBULATORY_CARE_PROVIDER_SITE_OTHER): Payer: Medicare HMO | Admitting: Neurology

## 2018-02-11 DIAGNOSIS — G4733 Obstructive sleep apnea (adult) (pediatric): Secondary | ICD-10-CM

## 2018-02-11 DIAGNOSIS — M2041 Other hammer toe(s) (acquired), right foot: Secondary | ICD-10-CM | POA: Diagnosis not present

## 2018-02-11 DIAGNOSIS — Z01812 Encounter for preprocedural laboratory examination: Secondary | ICD-10-CM | POA: Diagnosis not present

## 2018-02-11 DIAGNOSIS — Z01818 Encounter for other preprocedural examination: Secondary | ICD-10-CM | POA: Diagnosis not present

## 2018-02-11 DIAGNOSIS — B351 Tinea unguium: Secondary | ICD-10-CM | POA: Diagnosis not present

## 2018-02-11 DIAGNOSIS — L608 Other nail disorders: Secondary | ICD-10-CM | POA: Diagnosis not present

## 2018-02-11 DIAGNOSIS — L601 Onycholysis: Secondary | ICD-10-CM | POA: Diagnosis not present

## 2018-02-11 DIAGNOSIS — T481X5A Adverse effect of skeletal muscle relaxants [neuromuscular blocking agents], initial encounter: Secondary | ICD-10-CM

## 2018-02-11 DIAGNOSIS — M2042 Other hammer toe(s) (acquired), left foot: Secondary | ICD-10-CM | POA: Diagnosis not present

## 2018-02-11 DIAGNOSIS — L603 Nail dystrophy: Secondary | ICD-10-CM | POA: Diagnosis not present

## 2018-02-11 DIAGNOSIS — R0683 Snoring: Secondary | ICD-10-CM

## 2018-02-19 NOTE — Procedures (Signed)
[facility] [place],  [address] NAME:   [name]                                                       DOB: Jan 27, 1946 MEDICAL RECORD NUMBER  [id number]                                               DOS: 02/11/2018 REFERRING PHYSICIAN: Glendale Chard, M.D. STUDY PERFORMED: Home Sleep Test by apnea link HISTORY: [name] is a 72 y.o. female patient. During her last visit with Dr. Baird Cancer she mentioned poor sleep quality, very loud snoring that has woken her up, and frequent nocturia, weight gain over the last 6 month, following a fall in October which injured ankle and elbow- and kept her from exercising. She used to walk 5 miles a day. She has HTN.  Chief complaint: "[name] reports that her grandson has confirmed that she snores loudly, and he has witnessed this.  She is also concerned about her frequent bathroom breaks.  She has insomnia now, which is a rather new phenomenon". Epworth score 4/ 24, no naps in daytime, Fatigue severity score 15 points, depression score 2/ 15 points, and BMI: 38.3.  STUDY RESULTS:  Total Recording Time: 7 hours, 54 minutes Total Apnea/Hypopnea Index (AHI): 12.5 /h; RDI was 13.0 /h. Average Oxygen Saturation:  93 %; Lowest Oxygen Desaturation: 76 %  Total Time in Oxygen Saturation below 89 % was 12 minutes.  Average Heart Rate: 73 bpm in sinus rhythm (between 59 and 101 bpm). IMPRESSION: HST documented unclassified apneas (not categorized into central or obstructive type), at a mild apnea AHI of 12.5, without prolonged hypoxemia. Oxygen desaturation index was 15.5 /h. RDI indicated additional respiratory events, likely snoring related.    RECOMMENDATION: This mild degree of apnea can be treated by CPAP, by dental device and likely resolves with weight loss to normal BMI. Snoring will respond to the same treatment options. I will be happy to order an auto CPAP for this patient, but want her to  commit to weight loss for sustained improvement of apnea. Auto CPAP will adjust to pressure needs as weight is lost.   I certify that I have reviewed the raw data recording prior to the issuance of this report in accordance with the standards of the American Academy of Sleep Medicine (AASM).  Larey Seat, M.D.     02-19-2018  Medical Director of [facility], accredited by the AASM. Diplomat of the ABPN and ABSM

## 2018-02-19 NOTE — Addendum Note (Signed)
Addended by: Larey Seat on: 02/19/2018 06:17 PM   Modules accepted: Orders

## 2018-02-23 ENCOUNTER — Telehealth: Payer: Self-pay | Admitting: Neurology

## 2018-02-23 NOTE — Telephone Encounter (Signed)
Called the patient and went over results with her for her HST. The patient did have apnea present and Dr Brett Fairy is letting the patient decide what she would like to do to help with treating the apnea. I discussed her in detail what each option was and how it treated. The patient would like to think on it and return call once she has made her decision. The pt was leaning towards weight loss and/or dental device. The patient wanted to check with her insurance company to check with whether they would cover certain things. I will wait for her return call to see if she would like a referral to a dentist.

## 2018-02-23 NOTE — Telephone Encounter (Signed)
-----   Message from Larey Seat, MD sent at 02/19/2018  6:17 PM EDT ----- If her sleep changed only for the last 6-8 month, it is a result of weight gain.   This apnea link based HST documented unclassified apneas (not categorized into central or obstructive type), at a mild apnea AHI of 12.5, without prolonged hypoxemia.  Oxygen desaturation index was 15.5 /h. RDI indicated additional respiratory events, likely snoring related.    RECOMMENDATION: This mild degree of apnea can be treated by CPAP, by dental device and likely resolves with weight loss to normal  BMI. Snoring will respond to the same treatment options.I will be happy to order an auto CPAP for this patient, but want  her to commit to weight loss for sustained improvement of apnea. Auto CPAP will adjust to pressure needs as weight is lost.   Cc DR Baird Cancer

## 2018-03-30 ENCOUNTER — Encounter (HOSPITAL_COMMUNITY): Payer: Self-pay | Admitting: Family Medicine

## 2018-03-30 ENCOUNTER — Emergency Department (HOSPITAL_BASED_OUTPATIENT_CLINIC_OR_DEPARTMENT_OTHER): Payer: Medicare HMO

## 2018-03-30 ENCOUNTER — Emergency Department (HOSPITAL_COMMUNITY): Payer: Medicare HMO

## 2018-03-30 ENCOUNTER — Other Ambulatory Visit: Payer: Self-pay

## 2018-03-30 ENCOUNTER — Emergency Department (HOSPITAL_COMMUNITY)
Admission: EM | Admit: 2018-03-30 | Discharge: 2018-03-30 | Disposition: A | Discharge status: Home / Self Care | Payer: Medicare HMO | Attending: Emergency Medicine | Admitting: Emergency Medicine

## 2018-03-30 DIAGNOSIS — M79662 Pain in left lower leg: Secondary | ICD-10-CM | POA: Insufficient documentation

## 2018-03-30 DIAGNOSIS — M79609 Pain in unspecified limb: Secondary | ICD-10-CM | POA: Diagnosis not present

## 2018-03-30 DIAGNOSIS — R7309 Other abnormal glucose: Secondary | ICD-10-CM | POA: Diagnosis not present

## 2018-03-30 DIAGNOSIS — M25562 Pain in left knee: Secondary | ICD-10-CM | POA: Diagnosis not present

## 2018-03-30 DIAGNOSIS — R42 Dizziness and giddiness: Secondary | ICD-10-CM

## 2018-03-30 DIAGNOSIS — M7122 Synovial cyst of popliteal space [Baker], left knee: Secondary | ICD-10-CM

## 2018-03-30 DIAGNOSIS — I10 Essential (primary) hypertension: Secondary | ICD-10-CM | POA: Diagnosis not present

## 2018-03-30 DIAGNOSIS — Z79899 Other long term (current) drug therapy: Secondary | ICD-10-CM | POA: Diagnosis not present

## 2018-03-30 DIAGNOSIS — Z87891 Personal history of nicotine dependence: Secondary | ICD-10-CM | POA: Insufficient documentation

## 2018-03-30 DIAGNOSIS — R9431 Abnormal electrocardiogram [ECG] [EKG]: Secondary | ICD-10-CM | POA: Diagnosis not present

## 2018-03-30 DIAGNOSIS — E7800 Pure hypercholesterolemia, unspecified: Secondary | ICD-10-CM | POA: Diagnosis not present

## 2018-03-30 LAB — URINALYSIS, ROUTINE W REFLEX MICROSCOPIC
BILIRUBIN URINE: NEGATIVE
Glucose, UA: NEGATIVE mg/dL
HGB URINE DIPSTICK: NEGATIVE
Ketones, ur: NEGATIVE mg/dL
Leukocytes, UA: NEGATIVE
Nitrite: NEGATIVE
Protein, ur: NEGATIVE mg/dL
Specific Gravity, Urine: 1.01 (ref 1.005–1.030)
pH: 7 (ref 5.0–8.0)

## 2018-03-30 LAB — BASIC METABOLIC PANEL
Anion gap: 10 (ref 5–15)
BUN: 15 mg/dL (ref 6–20)
CALCIUM: 9.6 mg/dL (ref 8.9–10.3)
CO2: 27 mmol/L (ref 22–32)
CREATININE: 1.15 mg/dL — AB (ref 0.44–1.00)
Chloride: 104 mmol/L (ref 101–111)
GFR calc non Af Amer: 47 mL/min — ABNORMAL LOW (ref >60)
GFR, EST AFRICAN AMERICAN: 54 mL/min — AB (ref >60)
Glucose, Bld: 98 mg/dL (ref 65–99)
Potassium: 4.3 mmol/L (ref 3.5–5.1)
Sodium: 141 mmol/L (ref 135–145)

## 2018-03-30 LAB — CBC
HEMATOCRIT: 42 % (ref 36.0–46.0)
HEMOGLOBIN: 13.8 g/dL (ref 12.0–15.0)
MCH: 30.8 pg (ref 26.0–34.0)
MCHC: 32.9 g/dL (ref 30.0–36.0)
MCV: 93.8 fL (ref 78.0–100.0)
Platelets: 308 10*3/uL (ref 150–400)
RBC: 4.48 MIL/uL (ref 3.87–5.11)
RDW: 13.9 % (ref 11.5–15.5)
WBC: 5 10*3/uL (ref 4.0–10.5)

## 2018-03-30 LAB — CBG MONITORING, ED: GLUCOSE-CAPILLARY: 81 mg/dL (ref 65–99)

## 2018-03-30 LAB — I-STAT TROPONIN, ED: Troponin i, poc: 0 ng/mL (ref 0.00–0.08)

## 2018-03-30 MED ORDER — IBUPROFEN 800 MG PO TABS: 800.0000 mg | ORAL_TABLET | Freq: Three times a day (TID) | ORAL | 0 refills | Status: DC

## 2018-03-30 NOTE — Progress Notes (Signed)
Preliminary notes--Left lower extremity venous duplex exam completed.  Negative for DVT. Limited study due to patient body habitus.  Left popliteal fossa medial portion, there is a complex fluid collection noted, 2.75x0.85cm.   [name] (RDMS RVT) 03/30/18 5:01 PM

## 2018-03-30 NOTE — Discharge Instructions (Signed)
Take motrin as needed for pain.   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 See your orthopedic doctor about this.   See your primary care doctor in a week to recheck your blood pressure   Return to ER if you have worse dizziness, leg pain, chest pain, trouble breathing.

## 2018-03-30 NOTE — ED Triage Notes (Signed)
Patient states for the last week and half, she noticed her BP drop down and spike up. Also, experiencing dizziness and left knee pain. Patient called her PCP today and the office recommend her to have further ED evaluation.

## 2018-03-30 NOTE — ED Notes (Signed)
Ultrasound at bedside

## 2018-03-30 NOTE — ED Notes (Signed)
Assisted patient to the restroom with a steady gait.

## 2018-03-30 NOTE — ED Notes (Signed)
Pt has been informed that urine was needed

## 2018-03-30 NOTE — ED Provider Notes (Signed)
[facility] DEPT Provider Note   CSN: [id number] Arrival date & time: 03/30/18  1321     History   Chief Complaint Chief Complaint  Patient presents with  . Dizziness    HPI [name] is a 72 y.o. female hx of HTN, here presenting with dizziness, hypertension, left leg pain.  Patient states that she recently came back from traveling to [place].  She has progressive left leg pain and calf pain for the last 2 weeks.  Patient also has some dizziness and lightheadedness.  She states that occasionally the room is spinning but denies any trouble speaking or trouble walking.  Patient denies any chest pain or shortness of breath.  Over the last 2 to 3 days, patient's symptoms worsen so she has been checking her blood pressure and has been very labile.  Notes that occasionally blood pressure goes over 200 and other times drops below 80.  She talked to her doctor and was sent here for further evaluation.  Patient takes Maxide for her hypertension.   The history is provided by the patient.    Past Medical History:  Diagnosis Date  . Falls   . Hypertension   . Varicose veins of both lower extremities   . Vitamin D deficiency     Patient Active Problem List   Diagnosis Date Noted  . Snorings 01/22/2018  . 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 Nocturia more than twice per night 01/22/2018  . Obesity with body mass index (BMI) of 35.0 to 39.9 without comorbidity 01/22/2018  . Varicose veins of bilateral lower extremities with other complications 94/76/5465    Past Surgical History:  Procedure Laterality Date  . BREAST BIOPSY Right   . BREAST EXCISIONAL BIOPSY Right   . FOOT FRACTURE SURGERY Right   . TUBAL LIGATION       OB History   None      Home Medications    Prior to Admission medications   Medication Sig Start Date End Date Taking? Authorizing Provider  ibuprofen (ADVIL,MOTRIN) 200 MG tablet Take 800 mg by mouth every 6  (six) hours as needed for moderate pain.   Yes [provider]  MAGNESIUM PO Take 4 tablets by mouth daily as needed (cramping).   Yes [provider]  meloxicam (MOBIC) 15 MG tablet Take 15 mg by mouth daily as needed for pain.    Yes [provider]  triamterene-hydrochlorothiazide (MAXZIDE-25) 37.5-25 MG tablet Take 1 tablet by mouth daily.   Yes [provider]  TURMERIC CURCUMIN PO Take 750 mg by mouth daily.   Yes [provider]    Family History Family History  Problem Relation Age of Onset  . Hypertension Mother   . Hypertension Father   . Cancer Father   . Diabetes Brother   . Breast cancer Paternal Aunt   . Breast cancer Paternal Grandmother     Social History Social History   Tobacco Use  . Smoking status: Former Research scientist (life sciences)  . Smokeless tobacco: Never Used  . Tobacco comment: quit at age 64,only smoked for 1-2 years  Substance Use Topics  . Alcohol use: Yes    Comment: Wine 3 glasses a week.   . Drug use: No     Allergies   Patient has no known allergies.   Review of Systems Review of Systems  Neurological: Positive for dizziness.  All other systems reviewed and are negative.    Physical Exam Updated Vital Signs BP [name])  141/87 (BP Location: Left Arm)   Pulse 70   Temp 98 F (36.7 C) (Oral)   Resp 20   Ht 5\' 7"  (1.702 m)   Wt 108 kg (238 lb)   SpO2 100%   BMI 37.28 kg/m   Physical Exam  Constitutional: She is oriented to person, place, and time. She appears well-developed and well-nourished.  Slightly anxious   HENT:  Head: Normocephalic.  Mouth/Throat: Oropharynx is clear and moist.  Eyes: Pupils are equal, round, and reactive to light. EOM are normal.  No obvious nystagmus   Neck: Normal range of motion. Neck supple.  Cardiovascular: Normal rate, regular rhythm and normal heart sounds.  Pulmonary/Chest: Effort normal and breath sounds normal. No stridor. No respiratory distress. She has no wheezes.    Abdominal: Soft. Bowel sounds are normal. She exhibits no distension. There is no tenderness. There is no guarding.  Musculoskeletal:  Dec ROM L knee, some L knee effusion. Mild L calf tenderness.   Neurological: She is alert and oriented to person, place, and time.  Skin: Skin is warm.  Psychiatric: She has a normal mood and affect.  Nursing note and vitals reviewed.    ED Treatments / Results  Labs (all labs ordered are listed, but only abnormal results are displayed) Labs Reviewed  BASIC METABOLIC PANEL - Abnormal; Notable for the following components:      Result Value   Creatinine, Ser 1.15 (*)    GFR calc non Af Amer 47 (*)    GFR calc Af Amer 54 (*)    All other components within normal limits  CBC  URINALYSIS, ROUTINE W REFLEX MICROSCOPIC  CBG MONITORING, ED  I-STAT TROPONIN, ED    EKG EKG Interpretation  Date/Time:  Monday Mar 30 2018 13:32:46 EDT Ventricular Rate:  76 PR Interval:  118 QRS Duration: 80 QT Interval:  392 QTC Calculation: 441 R Axis:   -31 Text Interpretation:  Normal sinus rhythm Left axis deviation Anterolateral infarct , age undetermined Abnormal ECG No previous ECGs available Confirmed by Wandra Arthurs ([id number]) on 03/30/2018 3:05:52 PM   Radiology Dg Chest 2 View  Result Date: 03/30/2018 CLINICAL DATA:  Labile blood pressure.  Dizziness.  Left knee pain. EXAM: CHEST - 2 VIEW COMPARISON:  02/11/2005 FINDINGS: Heart size is normal. Mediastinal shadows are normal. The lungs are clear. No bronchial thickening. No infiltrate, mass, effusion or collapse. Pulmonary vascularity is normal. No bony abnormality. IMPRESSION: Normal chest Electronically Signed   By: Nelson Chimes M.D.   On: 03/30/2018 16:54   Ct Head Wo Contrast  Result Date: 03/30/2018 CLINICAL DATA:  72 year old female for the past week and a half has noticed blood pressure fluctuation. Dizziness. Initial encounter. EXAM: CT HEAD WITHOUT CONTRAST TECHNIQUE: Contiguous axial images were  obtained from the base of the skull through the vertex without intravenous contrast. COMPARISON:  None. FINDINGS: Brain: No intracranial hemorrhage or CT evidence of large acute infarct. No intracranial mass lesion noted on this unenhanced exam. Vascular: No hyperdense vessel. Skull: Negative Sinuses/Orbits: No acute orbital abnormality. Visualized paranasal sinuses are clear. Other: Mastoid air cells and middle ear cavities are clear. IMPRESSION: No acute intracranial abnormality noted. Electronically Signed   By: Genia Del M.D.   On: 03/30/2018 16:54   Dg Knee Complete 4 Views Left  Result Date: 03/30/2018 CLINICAL DATA:  Knee pain over the last several weeks. EXAM: LEFT KNEE - COMPLETE 4+ VIEW COMPARISON:  None. FINDINGS: Osteoarthritis, most advanced in the medial compartment  and patellofemoral joint. Small amount of joint fluid. No acute finding. 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 Electronically Signed   By: Nelson Chimes M.D.   On: 03/30/2018 16:54    Procedures Procedures (including critical care time)  Medications Ordered in ED Medications - No data to display   Initial Impression / Assessment and Plan / ED Course  I have reviewed the triage vital signs and the nursing notes.  Pertinent labs & imaging results that were available during my care of the patient were reviewed by me and considered in my medical decision making (see chart for details).    [name] is a 72 y.o. female here with dizziness, L calf tenderness, labile blood pressures. She recently came back from [place] so consider DVT L leg. I think dizziness likely from BP changes, low suspicion for stroke so if CT head neg, will not need MRI. Will get labs to r/o hypertensive urgency.   5:45 PM BP 140s. Labs and xray and CT head unremarkable. DVT study neg but there is small L baker's cyst, likely causing her pain. Will apply ace wrap, start  on motrin. She has orthopedic follow up and had baker's cyst before.    Final Clinical Impressions(s) / ED Diagnoses   Final diagnoses:  None    ED Discharge Orders    None       Drenda Freeze, MD 03/30/18 1746

## 2018-03-31 DIAGNOSIS — M25522 Pain in left elbow: Secondary | ICD-10-CM | POA: Diagnosis not present

## 2018-03-31 DIAGNOSIS — M25561 Pain in right knee: Secondary | ICD-10-CM | POA: Diagnosis not present

## 2018-03-31 DIAGNOSIS — M25562 Pain in left knee: Secondary | ICD-10-CM | POA: Diagnosis not present

## 2018-04-03 DIAGNOSIS — Z09 Encounter for follow-up examination after completed treatment for conditions other than malignant neoplasm: Secondary | ICD-10-CM | POA: Diagnosis not present

## 2018-04-03 DIAGNOSIS — I10 Essential (primary) hypertension: Secondary | ICD-10-CM | POA: Diagnosis not present

## 2018-04-03 DIAGNOSIS — M1712 Unilateral primary osteoarthritis, left knee: Secondary | ICD-10-CM | POA: Diagnosis not present

## 2018-04-03 DIAGNOSIS — E669 Obesity, unspecified: Secondary | ICD-10-CM | POA: Diagnosis not present

## 2018-04-20 DIAGNOSIS — Z833 Family history of diabetes mellitus: Secondary | ICD-10-CM | POA: Diagnosis not present

## 2018-04-20 DIAGNOSIS — E669 Obesity, unspecified: Secondary | ICD-10-CM | POA: Diagnosis not present

## 2018-04-20 DIAGNOSIS — Z9981 Dependence on supplemental oxygen: Secondary | ICD-10-CM | POA: Diagnosis not present

## 2018-04-20 DIAGNOSIS — Z7722 Contact with and (suspected) exposure to environmental tobacco smoke (acute) (chronic): Secondary | ICD-10-CM | POA: Diagnosis not present

## 2018-04-20 DIAGNOSIS — Z6835 Body mass index (BMI) 35.0-35.9, adult: Secondary | ICD-10-CM | POA: Diagnosis not present

## 2018-04-20 DIAGNOSIS — Z87891 Personal history of nicotine dependence: Secondary | ICD-10-CM | POA: Diagnosis not present

## 2018-04-20 DIAGNOSIS — M1990 Unspecified osteoarthritis, unspecified site: Secondary | ICD-10-CM | POA: Diagnosis not present

## 2018-04-20 DIAGNOSIS — B353 Tinea pedis: Secondary | ICD-10-CM | POA: Diagnosis not present

## 2018-04-20 DIAGNOSIS — I10 Essential (primary) hypertension: Secondary | ICD-10-CM | POA: Diagnosis not present

## 2018-04-20 DIAGNOSIS — G8929 Other chronic pain: Secondary | ICD-10-CM | POA: Diagnosis not present

## 2018-04-27 DIAGNOSIS — M25462 Effusion, left knee: Secondary | ICD-10-CM | POA: Diagnosis not present

## 2018-04-30 DIAGNOSIS — B351 Tinea unguium: Secondary | ICD-10-CM | POA: Diagnosis not present

## 2018-05-05 DIAGNOSIS — M25462 Effusion, left knee: Secondary | ICD-10-CM | POA: Diagnosis not present

## 2018-05-05 DIAGNOSIS — M25522 Pain in left elbow: Secondary | ICD-10-CM | POA: Diagnosis not present

## 2018-05-05 DIAGNOSIS — M1712 Unilateral primary osteoarthritis, left knee: Secondary | ICD-10-CM | POA: Diagnosis not present

## 2018-05-07 DIAGNOSIS — I10 Essential (primary) hypertension: Secondary | ICD-10-CM | POA: Diagnosis not present

## 2018-05-07 DIAGNOSIS — E119 Type 2 diabetes mellitus without complications: Secondary | ICD-10-CM | POA: Diagnosis not present

## 2018-05-07 DIAGNOSIS — R7309 Other abnormal glucose: Secondary | ICD-10-CM | POA: Diagnosis not present

## 2018-05-12 DIAGNOSIS — M1712 Unilateral primary osteoarthritis, left knee: Secondary | ICD-10-CM | POA: Diagnosis not present

## 2018-05-22 ENCOUNTER — Other Ambulatory Visit: Payer: Self-pay | Admitting: Orthopedic Surgery

## 2018-05-22 ENCOUNTER — Ambulatory Visit
Admission: RE | Admit: 2018-05-22 | Discharge: 2018-05-22 | Disposition: A | Discharge status: Home / Self Care | Payer: Medicare HMO | Source: Ambulatory Visit | Attending: Orthopedic Surgery | Admitting: Orthopedic Surgery

## 2018-05-22 DIAGNOSIS — S52135K Nondisplaced fracture of neck of left radius, subsequent encounter for closed fracture with nonunion: Secondary | ICD-10-CM

## 2018-05-22 DIAGNOSIS — S52122A Displaced fracture of head of left radius, initial encounter for closed fracture: Secondary | ICD-10-CM | POA: Diagnosis not present

## 2018-05-28 DIAGNOSIS — M25522 Pain in left elbow: Secondary | ICD-10-CM | POA: Diagnosis not present

## 2018-06-01 DIAGNOSIS — B351 Tinea unguium: Secondary | ICD-10-CM | POA: Diagnosis not present

## 2018-06-25 DIAGNOSIS — R69 Illness, unspecified: Secondary | ICD-10-CM | POA: Diagnosis not present

## 2018-06-25 DIAGNOSIS — R944 Abnormal results of kidney function studies: Secondary | ICD-10-CM | POA: Diagnosis not present

## 2018-06-25 DIAGNOSIS — Z1389 Encounter for screening for other disorder: Secondary | ICD-10-CM | POA: Diagnosis not present

## 2018-06-25 DIAGNOSIS — I10 Essential (primary) hypertension: Secondary | ICD-10-CM | POA: Diagnosis not present

## 2018-06-25 DIAGNOSIS — L84 Corns and callosities: Secondary | ICD-10-CM | POA: Diagnosis not present

## 2018-06-25 DIAGNOSIS — E559 Vitamin D deficiency, unspecified: Secondary | ICD-10-CM | POA: Diagnosis not present

## 2018-06-29 DIAGNOSIS — B351 Tinea unguium: Secondary | ICD-10-CM | POA: Diagnosis not present

## 2018-09-11 ENCOUNTER — Ambulatory Visit (INDEPENDENT_AMBULATORY_CARE_PROVIDER_SITE_OTHER): Payer: Medicare HMO | Admitting: Nurse Practitioner

## 2018-09-11 ENCOUNTER — Encounter: Payer: Self-pay | Admitting: Nurse Practitioner

## 2018-09-11 VITALS — BP 140/90 | HR 70 | Temp 97.9°F | Ht 67.0 in | Wt 210.0 lb

## 2018-09-11 DIAGNOSIS — S90862A Insect bite (nonvenomous), left foot, initial encounter: Secondary | ICD-10-CM

## 2018-09-11 DIAGNOSIS — S90861A Insect bite (nonvenomous), right foot, initial encounter: Secondary | ICD-10-CM | POA: Diagnosis not present

## 2018-09-11 DIAGNOSIS — Z23 Encounter for immunization: Secondary | ICD-10-CM

## 2018-09-11 DIAGNOSIS — W57XXXA Bitten or stung by nonvenomous insect and other nonvenomous arthropods, initial encounter: Secondary | ICD-10-CM

## 2018-09-11 DIAGNOSIS — Z1231 Encounter for screening mammogram for malignant neoplasm of breast: Secondary | ICD-10-CM

## 2018-09-11 MED ORDER — PREDNISONE 10 MG (21) PO TBPK: ORAL_TABLET | ORAL | 0 refills | Status: DC

## 2018-09-11 MED ORDER — TRIAMCINOLONE ACETONIDE 40 MG/ML IJ SUSP
60.0000 mg | Freq: Once | INTRAMUSCULAR | Status: AC
  Administered 2018-09-11: 60 mg via INTRAMUSCULAR

## 2018-09-11 NOTE — Progress Notes (Signed)
  Subjective:     Patient ID: [name] , female    DOB: 01-26-1946 , 72 y.o.   MRN: [id number]   Chief Complaint  Patient presents with  . Edema    got bit by fire ants yesterday and has had swelling ever since     HPI  Rash  This is a chronic problem. The current episode started yesterday. The problem has been rapidly worsening since onset. The affected locations include the right foot and left foot. The rash is characterized by burning, redness and swelling. Pertinent negatives include no fatigue or sore throat. Past treatments include anti-itch cream.     Past Medical History:  Diagnosis Date  . Falls   . Hypertension   . Varicose veins of both lower extremities   . Vitamin D deficiency       Current Outpatient Medications:  .  ibuprofen (ADVIL,MOTRIN) 200 MG tablet, Take 800 mg by mouth every 6 (six) hours as needed for moderate pain., Disp: , Rfl:  .  ibuprofen (ADVIL,MOTRIN) 800 MG tablet, Take 1 tablet (800 mg total) by mouth 3 (three) times daily., Disp: 21 tablet, Rfl: 0 .  lisinopril (PRINIVIL,ZESTRIL) 2.5 MG tablet, Take 2.5 mg by mouth daily., Disp: , Rfl: 1 .  MAGNESIUM PO, Take 4 tablets by mouth daily as needed (cramping)., Disp: , Rfl:  .  meloxicam (MOBIC) 15 MG tablet, Take 15 mg by mouth daily as needed for pain. , Disp: , Rfl:  .  triamterene-hydrochlorothiazide (MAXZIDE-25) 37.5-25 MG tablet, Take 1 tablet by mouth daily., Disp: , Rfl:  .  TURMERIC CURCUMIN PO, Take 750 mg by mouth daily., Disp: , Rfl:    No Known Allergies   Review of Systems  Constitutional: Negative for fatigue.  HENT: Negative for sore throat.   Musculoskeletal: Negative.   Skin: Positive for rash.     Today's Vitals   09/11/18 1432  BP: 140/90  Pulse: 70  Temp: 97.9 F (36.6 C)  TempSrc: Oral  SpO2: 95%  Weight: 210 lb (95.3 kg)  Height: 5\' 7"  (1.702 m)   Body mass index is 32.89 kg/m.   Objective:  Physical Exam  Constitutional: She is oriented to person,  place, and time. She appears well-developed and well-nourished.  Cardiovascular: Normal rate, regular rhythm and normal heart sounds.  Pulmonary/Chest: Effort normal and breath sounds normal.  Musculoskeletal: Normal range of motion.  Neurological: She is alert and oriented to person, place, and time.  Skin: Skin is warm and dry. Capillary refill takes less than 2 seconds. Rash noted. There is erythema. No pallor.        Assessment And Plan:     1. Insect bites and stings  Erythematous, edematous areas to bilaterally feet  Will treat with steroid injection in office and prednisone  Encouraged to use cool compresses as needed twice a day - triamcinolone acetonide (KENALOG-40) injection 60 mg - predniSONE (STERAPRED UNI-PAK 21 TAB) 10 MG (21) TBPK tablet; Take as directed  Dispense: 21 tablet; Refill: 0  2. Encounter for screening mammogram for breast cancer  - MM Digital Screening; Future  3. Need for influenza vaccination  Influenza vaccine administered  Encouraged to take Tylenol as needed for fever or muscle aches. - Flu vaccine HIGH DOSE PF (Fluzone High dose)    Minette Brine, FNP

## 2018-09-11 NOTE — Patient Instructions (Signed)
Influenza Virus Vaccine (Flucelvax) What is this medicine? INFLUENZA VIRUS VACCINE (in floo EN zuh VAHY ruhs vak SEEN) helps to reduce the risk of getting influenza also known as the flu. The vaccine only helps protect you against some strains of the flu. This medicine may be used for other purposes; ask your health care provider or pharmacist if you have questions. COMMON BRAND NAME(S): FLUCELVAX What should I tell my health care provider before I take this medicine? They need to know if you have any of these conditions: -bleeding disorder like hemophilia -fever or infection -Guillain-Barre syndrome or other neurological problems -immune system problems -infection with the human immunodeficiency virus (HIV) or AIDS -low blood platelet counts -multiple sclerosis -an unusual or allergic reaction to influenza virus vaccine, other medicines, foods, dyes or preservatives -pregnant or trying to get pregnant -breast-feeding How should I use this medicine? This vaccine is for injection into a muscle. It is given by a health care professional. A copy of Vaccine Information Statements will be given before each vaccination. Read this sheet carefully each time. The sheet may change frequently. Talk to your pediatrician regarding the use of this medicine in children. Special care may be needed. Overdosage: If you think you've taken too much of this medicine contact a poison control center or emergency room at once. Overdosage: If you think you have taken too much of this medicine contact a poison control center or emergency room at once. NOTE: This medicine is only for you. Do not share this medicine with others. What if I miss a dose? This does not apply. What may interact with this medicine? -chemotherapy or radiation therapy -medicines that lower your immune system like etanercept, anakinra, infliximab, and adalimumab -medicines that treat or prevent blood clots like  warfarin -phenytoin -steroid medicines like prednisone or cortisone -theophylline -vaccines This list may not describe all possible interactions. Give your health care provider a list of all the medicines, herbs, non-prescription drugs, or dietary supplements you use. Also tell them if you smoke, drink alcohol, or use illegal drugs. Some items may interact with your medicine. What should I watch for while using this medicine? Report any side effects that do not go away within 3 days to your doctor or health care professional. Call your health care provider if any unusual symptoms occur within 6 weeks of receiving this vaccine. You may still catch the flu, but the illness is not usually as bad. You cannot get the flu from the vaccine. The vaccine will not protect against colds or other illnesses that may cause fever. The vaccine is needed every year. What side effects may I notice from receiving this medicine? Side effects that you should report to your doctor or health care professional as soon as possible: -allergic reactions like skin rash, itching or hives, swelling of the face, lips, or tongue Side effects that usually do not require medical attention (Report these to your doctor or health care professional if they continue or are bothersome.): -fever -headache -muscle aches and pains -pain, tenderness, redness, or swelling at the injection site -tiredness This list may not describe all possible side effects. Call your doctor for medical advice about side effects. You may report side effects to FDA at 1-800-FDA-1088. Where should I keep my medicine? The vaccine will be given by a health care professional in a clinic, pharmacy, doctor's office, or other health care setting. You will not be given vaccine doses to store at home. NOTE: This sheet is a summary.   It may not cover all possible information. If you have questions about this medicine, talk to your doctor, pharmacist, or health care  provider.  2018 Elsevier/Gold Standard (2011-10-16 14:06:47)    Insect Bite, Adult An insect bite can make your skin red, itchy, and swollen. Some insects can spread disease to people with a bite. However, most insect bites do not lead to disease, and most are not serious. Follow these instructions at home: Bite area care  Do not scratch the bite area.  Keep the bite area clean and dry.  Wash the bite area every day with soap and water as told by your doctor.  Check the bite area every day for signs of infection. Check for: ? More redness, swelling, or pain. ? Fluid or blood. ? Warmth. ? Pus. Managing pain, itching, and swelling  You may put any of these on the bite area as told by your doctor: ? A baking soda paste. ? Cortisone cream. ? Calamine lotion.  If directed, put ice on the bite area. ? Put ice in a plastic bag. ? Place a towel between your skin and the bag. ? Leave the ice on for 20 minutes, 2-3 times a day. Medicines  Take medicines or put medicines on your skin only as told by your doctor.  If you were prescribed an antibiotic medicine, use it as told by your doctor. Do not stop using the antibiotic even if your condition improves. General instructions  Keep all follow-up visits as told by your doctor. This is important. How is this prevented? To help you have a lower risk of insect bites:  When you are outside, wear clothing that covers your arms and legs.  Use insect repellent. The best insect repellents have: ? An active ingredient of DEET, picaridin, oil of lemon eucalyptus (OLE), or IR3535. ? Higher amounts of DEET or another active ingredient than other repellents have.  If your home windows do not have screens, think about putting some in.  Contact a doctor if:  You have more redness, swelling, or pain in the bite area.  You have fluid, blood, or pus coming from the bite area.  The bite area feels warm.  You have a fever. Get help right  away if:  You have joint pain.  You have a rash.  You have shortness of breath.  You feel more tired or sleepy than you normally do.  You have neck pain.  You have a headache.  You feel weaker than you normally do.  You have chest pain.  You have pain in your belly.  You feel sick to your stomach (nauseous) or you throw up (vomit). Summary  An insect bite can make your skin red, itchy, and swollen.  Do not scratch the bite area, and keep it clean and dry.  Ice can help with pain and itching from the bite. This information is not intended to replace advice given to you by your health care provider. Make sure you discuss any questions you have with your health care provider. Document Released: 11/01/2000 Document Revised: 06/06/2016 Document Reviewed: 03/22/2015 Elsevier Interactive Patient Education  2018 [employer].

## 2018-09-24 ENCOUNTER — Ambulatory Visit (INDEPENDENT_AMBULATORY_CARE_PROVIDER_SITE_OTHER): Payer: Medicare HMO | Admitting: Internal Medicine

## 2018-09-24 ENCOUNTER — Encounter: Payer: Self-pay | Admitting: Internal Medicine

## 2018-09-24 VITALS — BP 124/86 | HR 71 | Temp 98.1°F | Ht 67.0 in

## 2018-09-24 DIAGNOSIS — E669 Obesity, unspecified: Secondary | ICD-10-CM

## 2018-09-24 DIAGNOSIS — Z6832 Body mass index (BMI) 32.0-32.9, adult: Secondary | ICD-10-CM | POA: Diagnosis not present

## 2018-09-24 DIAGNOSIS — I10 Essential (primary) hypertension: Secondary | ICD-10-CM | POA: Diagnosis not present

## 2018-09-24 DIAGNOSIS — R7309 Other abnormal glucose: Secondary | ICD-10-CM | POA: Diagnosis not present

## 2018-09-24 DIAGNOSIS — M1712 Unilateral primary osteoarthritis, left knee: Secondary | ICD-10-CM | POA: Diagnosis not present

## 2018-09-25 LAB — BMP8+EGFR
BUN / CREAT RATIO: 13 (ref 12–28)
BUN: 16 mg/dL (ref 8–27)
CALCIUM: 9.6 mg/dL (ref 8.7–10.3)
CO2: 24 mmol/L (ref 20–29)
CREATININE: 1.23 mg/dL — AB (ref 0.57–1.00)
Chloride: 104 mmol/L (ref 96–106)
GFR calc Af Amer: 51 mL/min/{1.73_m2} — ABNORMAL LOW (ref >59)
GFR calc non Af Amer: 44 mL/min/{1.73_m2} — ABNORMAL LOW (ref >59)
GLUCOSE: 76 mg/dL (ref 65–99)
Potassium: 5 mmol/L (ref 3.5–5.2)
Sodium: 140 mmol/L (ref 134–144)

## 2018-09-25 LAB — HEMOGLOBIN A1C
ESTIMATED AVERAGE GLUCOSE: 108 mg/dL
Hgb A1c MFr Bld: 5.4 % (ref 4.8–5.6)

## 2018-09-27 NOTE — Progress Notes (Signed)
Subjective:     Patient ID: [name] , female    DOB: 1946-06-02 , 72 y.o.   MRN: [id number]   Chief Complaint  Patient presents with  . Hypertension    HPI  Hypertension  This is a chronic problem. The current episode started more than 1 year ago. The problem has been gradually improving since onset. The problem is controlled. Associated symptoms include palpitations. Pertinent negatives include no blurred vision, chest pain, headaches, orthopnea or shortness of breath. Risk factors for coronary artery disease include sedentary lifestyle. The current treatment provides moderate improvement.     Past Medical History:  Diagnosis Date  . Falls   . Hypertension   . Varicose veins of both lower extremities   . Vitamin D deficiency      Family History  Problem Relation Age of Onset  . Hypertension Mother   . Hypertension Father   . Cancer Father   . Diabetes Brother   . Breast cancer Paternal Aunt   . Breast cancer Paternal Grandmother      Current Outpatient Medications:  .  ibuprofen (ADVIL,MOTRIN) 200 MG tablet, Take 800 mg by mouth every 6 (six) hours as needed for moderate pain., Disp: , Rfl:  .  lisinopril (PRINIVIL,ZESTRIL) 2.5 MG tablet, Take 2.5 mg by mouth daily., Disp: , Rfl: 1 .  MAGNESIUM PO, Take 4 tablets by mouth daily as needed (cramping)., Disp: , Rfl:  .  meloxicam (MOBIC) 15 MG tablet, Take 15 mg by mouth daily as needed for pain. , Disp: , Rfl:  .  Multiple Vitamin (MULTIVITAMIN) capsule, Take 1 capsule by mouth daily., Disp: , Rfl:  .  triamterene-hydrochlorothiazide (MAXZIDE-25) 37.5-25 MG tablet, Take 1 tablet by mouth daily., Disp: , Rfl:  .  TURMERIC CURCUMIN PO, Take 750 mg by mouth daily., Disp: , Rfl:    No Known Allergies   Review of Systems  Constitutional: Negative.   Eyes: Negative for blurred vision.  Respiratory: Negative.  Negative for shortness of breath.   Cardiovascular: Positive for palpitations. Negative for chest pain and  orthopnea.  Gastrointestinal: Negative.   Genitourinary: Negative.   Neurological: Negative.  Negative for headaches.  Psychiatric/Behavioral: Negative.      Today's Vitals   09/24/18 1140  BP: 124/86  Pulse: 71  Temp: 98.1 F (36.7 C)  TempSrc: Oral  Height: _0  (1.702 m)   Body mass index is 32.89 kg/m.   Objective:  Physical Exam  Constitutional: She is oriented to person, place, and time. She appears well-developed and well-nourished.  HENT:  Head: Normocephalic and atraumatic.  Eyes: EOM are normal.  Cardiovascular: Normal rate, regular rhythm and normal heart sounds.  Pulmonary/Chest: Effort normal and breath sounds normal.  Neurological: She is alert and oriented to person, place, and time.  Psychiatric: She has a normal mood and affect.  Nursing note and vitals reviewed.       Assessment And Plan:     1. Essential hypertension, benign  Controlled. She will continue with current meds. I will check a bmet today. She is encouraged to avoid adding salt to her foods. Importance of regular exercise. She will rto in six months for re-evaluation.   - BMP8+EGFR  2. Localized osteoarthritis of left knee  We discussed the use of PRP therapy to help with this condition. Referral placed to Dr. Neomia Dear.  She may also benefit from topical compunded pain cream.  - Ambulatory referral to Pain Clinic  3. Other abnormal glucose  HER A1C HAS BEEN ELEVATED IN THE PAST. I WILL CHECK AN A1C, BMET TODAY. SHE WAS ENCOURAGED TO AVOID SUGARY BEVERAGES AND PROCESSED FOODS INCLUDNG BREADS, RICE AND PASTA.  - Hemoglobin A1c  4. Class 1 obesity with serious comorbidity and body mass index (BMI) of 32.0 to 32.9 in adult, unspecified obesity type  She is encouraged to strive for BMI less than 28 to decrease cardiac risk. Importance of regular exercise was discussed with the patient.    Maximino Greenland, MD

## 2018-09-27 NOTE — Progress Notes (Signed)
You are not prediabetic. Your kidney fxn is stable, no change from last visit. However, this is in stage 3 ckd. I would like for you to stop the triamterene. This is contributing to volume depletion and could be contributing to decreased kidney fxn. pls increase water intake. No less than 64 ounces daily. I would like for you to check your bp at home daily. I also suggest you take TWO lisinopril daily so we can try to keep bp controlled. In one week, pls rto in two weeks for bp check. I would like to check your kidney fxn  Again at that time.

## 2018-09-28 ENCOUNTER — Other Ambulatory Visit: Payer: Self-pay

## 2018-09-28 MED ORDER — LISINOPRIL 2.5 MG PO TABS: ORAL_TABLET | ORAL | 1 refills | Status: DC

## 2018-10-12 ENCOUNTER — Other Ambulatory Visit: Payer: Medicare HMO

## 2018-10-12 ENCOUNTER — Ambulatory Visit (INDEPENDENT_AMBULATORY_CARE_PROVIDER_SITE_OTHER): Payer: Medicare HMO | Admitting: Nurse Practitioner

## 2018-10-12 VITALS — BP 150/96 | HR 67 | Temp 98.0°F | Ht 67.0 in

## 2018-10-12 DIAGNOSIS — I10 Essential (primary) hypertension: Secondary | ICD-10-CM

## 2018-10-13 LAB — BMP8+EGFR
BUN/Creatinine Ratio: 12 (ref 12–28)
BUN: 12 mg/dL (ref 8–27)
CO2: 24 mmol/L (ref 20–29)
CREATININE: 1.04 mg/dL — AB (ref 0.57–1.00)
Calcium: 9.4 mg/dL (ref 8.7–10.3)
Chloride: 104 mmol/L (ref 96–106)
GFR calc Af Amer: 62 mL/min/{1.73_m2} (ref >59)
GFR calc non Af Amer: 54 mL/min/{1.73_m2} — ABNORMAL LOW (ref >59)
GLUCOSE: 88 mg/dL (ref 65–99)
Potassium: 4.6 mmol/L (ref 3.5–5.2)
Sodium: 142 mmol/L (ref 134–144)

## 2018-10-13 NOTE — Progress Notes (Signed)
Your kidney fxn is stable. Be sure to stay well hydrated.

## 2018-10-20 ENCOUNTER — Other Ambulatory Visit: Payer: Self-pay

## 2018-10-20 MED ORDER — LISINOPRIL 2.5 MG PO TABS: ORAL_TABLET | ORAL | 1 refills | Status: DC

## 2019-02-01 DIAGNOSIS — R69 Illness, unspecified: Secondary | ICD-10-CM | POA: Diagnosis not present

## 2019-02-09 ENCOUNTER — Emergency Department (HOSPITAL_COMMUNITY): Payer: Medicare HMO

## 2019-02-09 ENCOUNTER — Emergency Department (HOSPITAL_COMMUNITY)
Admission: EM | Admit: 2019-02-09 | Discharge: 2019-02-09 | Disposition: A | Discharge status: Home / Self Care | Payer: Medicare HMO | Attending: Emergency Medicine | Admitting: Emergency Medicine

## 2019-02-09 ENCOUNTER — Other Ambulatory Visit: Payer: Self-pay

## 2019-02-09 ENCOUNTER — Encounter (HOSPITAL_COMMUNITY): Payer: Self-pay | Admitting: Emergency Medicine

## 2019-02-09 DIAGNOSIS — Z79899 Other long term (current) drug therapy: Secondary | ICD-10-CM | POA: Insufficient documentation

## 2019-02-09 DIAGNOSIS — Z87891 Personal history of nicotine dependence: Secondary | ICD-10-CM | POA: Insufficient documentation

## 2019-02-09 DIAGNOSIS — R220 Localized swelling, mass and lump, head: Secondary | ICD-10-CM | POA: Diagnosis not present

## 2019-02-09 DIAGNOSIS — I10 Essential (primary) hypertension: Secondary | ICD-10-CM | POA: Diagnosis not present

## 2019-02-09 DIAGNOSIS — R200 Anesthesia of skin: Secondary | ICD-10-CM | POA: Diagnosis not present

## 2019-02-09 LAB — CBC WITH DIFFERENTIAL/PLATELET
ABS IMMATURE GRANULOCYTES: 0 10*3/uL (ref 0.00–0.07)
BASOS PCT: 1 %
Basophils Absolute: 0 10*3/uL (ref 0.0–0.1)
EOS ABS: 0.1 10*3/uL (ref 0.0–0.5)
Eosinophils Relative: 1 %
HCT: 38.5 % (ref 36.0–46.0)
HEMOGLOBIN: 12.3 g/dL (ref 12.0–15.0)
Immature Granulocytes: 0 %
Lymphocytes Relative: 41 %
Lymphs Abs: 1.8 10*3/uL (ref 0.7–4.0)
MCH: 30.8 pg (ref 26.0–34.0)
MCHC: 31.9 g/dL (ref 30.0–36.0)
MCV: 96.5 fL (ref 80.0–100.0)
MONO ABS: 0.3 10*3/uL (ref 0.1–1.0)
Monocytes Relative: 6 %
NEUTROS ABS: 2.2 10*3/uL (ref 1.7–7.7)
NEUTROS PCT: 51 %
Platelets: 261 10*3/uL (ref 150–400)
RBC: 3.99 MIL/uL (ref 3.87–5.11)
RDW: 12.9 % (ref 11.5–15.5)
WBC: 4.3 10*3/uL (ref 4.0–10.5)
nRBC: 0 % (ref 0.0–0.2)

## 2019-02-09 LAB — BASIC METABOLIC PANEL
ANION GAP: 5 (ref 5–15)
BUN: 11 mg/dL (ref 8–23)
CALCIUM: 8.9 mg/dL (ref 8.9–10.3)
CO2: 26 mmol/L (ref 22–32)
Chloride: 108 mmol/L (ref 98–111)
Creatinine, Ser: 0.79 mg/dL (ref 0.44–1.00)
GFR calc Af Amer: >60 mL/min (ref >60)
Glucose, Bld: 94 mg/dL (ref 70–99)
POTASSIUM: 4.2 mmol/L (ref 3.5–5.1)
SODIUM: 139 mmol/L (ref 135–145)

## 2019-02-09 MED ORDER — SODIUM CHLORIDE (PF) 0.9 % IJ SOLN
INTRAMUSCULAR | Status: AC
  Administered 2019-02-09: 14:00:00
  Filled 2019-02-09: qty 50

## 2019-02-09 MED ORDER — IOHEXOL 300 MG/ML  SOLN
75.0000 mL | Freq: Once | INTRAMUSCULAR | Status: AC | PRN
  Administered 2019-02-09: 75 mL via INTRAVENOUS

## 2019-02-09 MED ORDER — ACETAMINOPHEN 325 MG PO TABS
650.0000 mg | ORAL_TABLET | Freq: Once | ORAL | Status: AC
  Administered 2019-02-09: 650 mg via ORAL
  Filled 2019-02-09: qty 2

## 2019-02-09 NOTE — Discharge Instructions (Addendum)
You can try taking Benadryl of a 6 hours for swelling in your face. Follow-up closely with your primary care provider. If you develop high fever, redness to your skin, dental pain, return to the emergency department for evaluation.

## 2019-02-09 NOTE — ED Provider Notes (Signed)
Medical screening examination/treatment/procedure(s) were conducted as a shared visit with non-physician practitioner(s) and myself.  I personally evaluated the patient during the encounter.  None 73 year old female presents with 1 day of acute onset of left-sided facial pain.  Denies any trouble swallowing.  No tongue involvement.  No peripheral neurological complaints.  On exam she does have swelling around the parotid region.  Will order maxillofacial CT with contrast.   Lacretia Leigh, MD 02/09/19 1140

## 2019-02-09 NOTE — ED Notes (Signed)
Swelling is decreasing on left side of face.

## 2019-02-09 NOTE — ED Provider Notes (Signed)
[facility] DEPT Provider Note   CSN: [id number] Arrival date & time: 02/09/19  1048    History   Chief Complaint Chief Complaint  Patient presents with  . Facial Swelling    HPI [name] is a 73 y.o. female with past medical history of hypertension, vitamin D deficiency, presenting to the emergency department with complaint of left-sided facial numbness and swelling that began this morning upon awakening.  Patient states she was lying on her left side and suddenly began feeling a numbness sensation which she states is similar to the numbness feeling from a Novocain shot while at the dentist.  She states she got up looked in the mirror noticed her left face was swollen.  She has no pain for to her face or teeth.  No vision changes.  No hearing changes or taste changes.  No unilateral weakness to her extremities, or slurred speech.  She has a mild headache that she states is a "numbing feeling."     The history is provided by the patient.    Past Medical History:  Diagnosis Date  . Falls   . Hypertension   . Varicose veins of both lower extremities   . Vitamin D deficiency     Patient Active Problem List   Diagnosis Date Noted  . Snorings 01/22/2018  . 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 Nocturia more than twice per night 01/22/2018  . Obesity with body mass index (BMI) of 35.0 to 39.9 without comorbidity 01/22/2018  . Varicose veins of bilateral lower extremities with other complications 58/52/7782    Past Surgical History:  Procedure Laterality Date  . BREAST BIOPSY Right   . BREAST EXCISIONAL BIOPSY Right   . FOOT FRACTURE SURGERY Right   . TUBAL LIGATION       OB History   No obstetric history on file.      Home Medications    Prior to Admission medications   Medication Sig Start Date End Date Taking? Authorizing Provider  acetaminophen (TYLENOL) 500 MG tablet Take 1,000 mg by mouth daily as needed for moderate  pain.   Yes [provider]  CALCIUM PO Take by mouth.   Yes [provider]  lisinopril (PRINIVIL,ZESTRIL) 2.5 MG tablet Take 2 tablets by mouth daily 10/20/18  Yes Glendale Chard, MD  Multiple Vitamin (MULTIVITAMIN) capsule Take 1 capsule by mouth daily.   Yes [provider]  VITAMIN D PO Take by mouth.   Yes [provider]    Family History Family History  Problem Relation Age of Onset  . Hypertension Mother   . Hypertension Father   . Cancer Father   . Diabetes Brother   . Breast cancer Paternal Aunt   . Breast cancer Paternal Grandmother     Social History Social History   Tobacco Use  . Smoking status: Former Research scientist (life sciences)  . Smokeless tobacco: Never Used  . Tobacco comment: quit at age 76,only smoked for 1-2 years  Substance Use Topics  . Alcohol use: Yes    Comment: Wine 3 glasses a week.   . Drug use: No     Allergies   Patient has no known allergies.   Review of Systems Review of Systems  All other systems reviewed and are negative.    Physical Exam Updated Vital Signs BP (!) 184/95   Pulse 85   Temp 98.4 F (36.9 C) (Oral)   Resp 16   SpO2 100%   Physical Exam Vitals  signs and nursing note reviewed.  Constitutional:      General: She is not in acute distress.    Appearance: She is well-developed.  HENT:     Head: Normocephalic and atraumatic.     Comments: Left-sided facial swelling noted.  No skin changes.  No tenderness to the dentition, however poor dentition throughout.  No obvious gingival abscess.  No swelling of the lips or tongue.  Tolerating secretions.  Tenderness to left parotid region. Eyes:     Extraocular Movements: Extraocular movements intact.     Conjunctiva/sclera: Conjunctivae normal.     Pupils: Pupils are equal, round, and reactive to light.  Neck:     Musculoskeletal: Normal range of motion and neck supple. No neck rigidity.  Cardiovascular:     Rate and Rhythm: Normal rate and regular  rhythm.  Pulmonary:     Effort: Pulmonary effort is normal. No respiratory distress.     Breath sounds: Normal breath sounds.  Abdominal:     Palpations: Abdomen is soft.  Lymphadenopathy:     Cervical: No cervical adenopathy.  Skin:    General: Skin is warm.  Neurological:     Mental Status: She is alert.     Comments: Mental Status:  Alert, oriented, thought content appropriate, able to give a coherent history. Speech fluent without evidence of aphasia. Able to follow 2 step commands without difficulty.  Cranial Nerves:  II:   pupils equal, round, reactive to light III,IV, VI: ptosis not present, extra-ocular motions intact bilaterally  V,VII: smile symmetric, dec light touch sensation to left face VIII: hearing grossly normal to voice  X: uvula elevates symmetrically  XI: bilateral shoulder shrug symmetric and strong XII: midline tongue extension without fassiculations Motor:  Normal tone. 5/5 in upper and lower extremities bilaterally including strong and equal grip strength and dorsiflexion/plantar flexion.  Cerebellar: normal finger-to-nose with bilateral upper extremities Gait: normal gait and balance CV: distal pulses palpable throughout    Psychiatric:        Behavior: Behavior normal.      ED Treatments / Results  Labs (all labs ordered are listed, but only abnormal results are displayed) Labs Reviewed  BASIC METABOLIC PANEL  CBC WITH DIFFERENTIAL/PLATELET    EKG None  Radiology Ct Maxillofacial W Contrast  Result Date: 02/09/2019 CLINICAL DATA:  LEFT facial swelling began 1 hour ago. EXAM: CT MAXILLOFACIAL WITH CONTRAST TECHNIQUE: Multidetector CT imaging of the maxillofacial structures was performed with intravenous contrast. Multiplanar CT image reconstructions were also generated. CONTRAST:  55mL OMNIPAQUE IOHEXOL 300 MG/ML  SOLN COMPARISON:  None. FINDINGS: Osseous: No fracture or mandibular dislocation. No destructive process. No visible periapical  lucency/periodontal disease, although assessment is hampered by multiple areas of root canal placement and dental amalgam. Orbits: Negative. No traumatic or inflammatory finding. Sinuses: Clear. Soft tissues: No salivary gland abnormality is seen. Specifically no evidence of parotid stone or parotitis. No submandibular duct stone or enlargement is identified. There is asymmetric soft tissue swelling over the LEFT face, possible cellulitis, but no discrete abscess. No visible foreign body. Limited intracranial: No significant or unexpected finding. IMPRESSION: Asymmetric soft tissue swelling over the LEFT face, but no discrete abscess. No salivary gland abnormality is seen. No salivary duct stone or enlargement. No visible dental cause for LEFT facial swelling, although because of significant hardware and streak artifact, correlation with oral cavity physical exam is suggested. Electronically Signed   By: Staci Righter M.D.   On: 02/09/2019 14:51    Procedures  Procedures (including critical care time)  Medications Ordered in ED Medications  iohexol (OMNIPAQUE) 300 MG/ML solution 75 mL (75 mLs Intravenous Contrast Given 02/09/19 1414)  sodium chloride (PF) 0.9 % injection (  Given by Other 02/09/19 1424)  acetaminophen (TYLENOL) tablet 650 mg (650 mg Oral Given 02/09/19 1338)     Initial Impression / Assessment and Plan / ED Course  I have reviewed the triage vital signs and the nursing notes.  Pertinent labs & imaging results that were available during my care of the patient were reviewed by me and considered in my medical decision making (see chart for details).        Patient presenting with swelling to the left side of her face that began this morning.  She also reports numbness sensation to the left face.  Mild headache, with no other symptoms.  No dental pain.  On exam, there is no tenderness to the dentition, no obvious dental abscess.  There are no skin changes overlying the swelling.   There is some mild tenderness to the parotid region.  Patient reports some decrease sensation to the left face, however no other neuro deficits.  Patient is alert and in no distress.  Treated with Tylenol.  Patient evaluated by Dr. Zenia Resides.  Labs and CT max face obtained and are negative for deep space infection. No evidence of acute or emergent pathology at this time.  Recommend symptomatic management, trial of antihistamines for swelling.  Close PCP follow-up.  Strict return precautions discussed.  Patient is agreeable plan and safe for discharge.  Patient was discussed with and evaluated by Dr. Zenia Resides.  Discussed results, findings, treatment and follow up. Patient advised of return precautions. Patient verbalized understanding and agreed with plan.  Final Clinical Impressions(s) / ED Diagnoses   Final diagnoses:  Swelling of left side of face    ED Discharge Orders    None       Robinson, [place] N, PA-C 02/09/19 1601    Lacretia Leigh, MD 02/11/19 [phone]

## 2019-02-09 NOTE — ED Triage Notes (Signed)
Pt has swelling to left side of face that started about an hour ago. Pt states that felt like got numbing shot from dentist.

## 2019-02-26 ENCOUNTER — Ambulatory Visit: Payer: Medicare HMO

## 2019-03-25 ENCOUNTER — Other Ambulatory Visit: Payer: Self-pay

## 2019-03-25 ENCOUNTER — Ambulatory Visit (INDEPENDENT_AMBULATORY_CARE_PROVIDER_SITE_OTHER): Payer: Medicare HMO | Admitting: Internal Medicine

## 2019-03-25 ENCOUNTER — Encounter: Payer: Self-pay | Admitting: Internal Medicine

## 2019-03-25 VITALS — BP 140/92 | HR 69 | Temp 98.4°F | Ht 67.0 in

## 2019-03-25 DIAGNOSIS — M79672 Pain in left foot: Secondary | ICD-10-CM | POA: Diagnosis not present

## 2019-03-25 DIAGNOSIS — M79671 Pain in right foot: Secondary | ICD-10-CM | POA: Diagnosis not present

## 2019-03-25 DIAGNOSIS — G5713 Meralgia paresthetica, bilateral lower limbs: Secondary | ICD-10-CM | POA: Diagnosis not present

## 2019-03-25 DIAGNOSIS — I10 Essential (primary) hypertension: Secondary | ICD-10-CM | POA: Diagnosis not present

## 2019-03-25 NOTE — Patient Instructions (Signed)
DASH Eating Plan  DASH stands for "Dietary Approaches to Stop Hypertension." The DASH eating plan is a healthy eating plan that has been shown to reduce high blood pressure (hypertension). It may also reduce your risk for type 2 diabetes, heart disease, and stroke. The DASH eating plan may also help with weight loss.  What are tips for following this plan?    General guidelines   Avoid eating more than 2,300 mg (milligrams) of salt (sodium) a day. If you have hypertension, you may need to reduce your sodium intake to 1,500 mg a day.   Limit alcohol intake to no more than 1 drink a day for nonpregnant women and 2 drinks a day for men. One drink equals 12 oz of beer, 5 oz of wine, or 1 oz of hard liquor.   Work with your health care provider to maintain a healthy body weight or to lose weight. Ask what an ideal weight is for you.   Get at least 30 minutes of exercise that causes your heart to beat faster (aerobic exercise) most days of the week. Activities may include walking, swimming, or biking.   Work with your health care provider or diet and nutrition specialist (dietitian) to adjust your eating plan to your individual calorie needs.  Reading food labels     Check food labels for the amount of sodium per serving. Choose foods with less than 5 percent of the Daily Value of sodium. Generally, foods with less than 300 mg of sodium per serving fit into this eating plan.   To find whole grains, look for the word "whole" as the first word in the ingredient list.  Shopping   Buy products labeled as "low-sodium" or "no salt added."   Buy fresh foods. Avoid canned foods and premade or frozen meals.  Cooking   Avoid adding salt when cooking. Use salt-free seasonings or herbs instead of table salt or sea salt. Check with your health care provider or pharmacist before using salt substitutes.   Do not fry foods. Cook foods using healthy methods such as baking, boiling, grilling, and broiling instead.   Cook with  heart-healthy oils, such as olive, canola, soybean, or sunflower oil.  Meal planning   Eat a balanced diet that includes:  ? 5 or more servings of fruits and vegetables each day. At each meal, try to fill half of your plate with fruits and vegetables.  ? Up to 6-8 servings of whole grains each day.  ? Less than 6 oz of lean meat, poultry, or fish each day. A 3-oz serving of meat is about the same size as a deck of cards. One egg equals 1 oz.  ? 2 servings of low-fat dairy each day.  ? A serving of nuts, seeds, or beans 5 times each week.  ? Heart-healthy fats. Healthy fats called Omega-3 fatty acids are found in foods such as flaxseeds and coldwater fish, like sardines, salmon, and mackerel.   Limit how much you eat of the following:  ? Canned or prepackaged foods.  ? Food that is high in trans fat, such as fried foods.  ? Food that is high in saturated fat, such as fatty meat.  ? Sweets, desserts, sugary drinks, and other foods with added sugar.  ? Full-fat dairy products.   Do not salt foods before eating.   Try to eat at least 2 vegetarian meals each week.   Eat more home-cooked food and less restaurant, buffet, and fast food.     When eating at a restaurant, ask that your food be prepared with less salt or no salt, if possible.  What foods are recommended?  The items listed may not be a complete list. Talk with your dietitian about what dietary choices are best for you.  Grains  Whole-grain or whole-wheat bread. Whole-grain or whole-wheat pasta. Brown rice. Oatmeal. Quinoa. Bulgur. Whole-grain and low-sodium cereals. Pita bread. Low-fat, low-sodium crackers. Whole-wheat flour tortillas.  Vegetables  Fresh or frozen vegetables (raw, steamed, roasted, or grilled). Low-sodium or reduced-sodium tomato and vegetable juice. Low-sodium or reduced-sodium tomato sauce and tomato paste. Low-sodium or reduced-sodium canned vegetables.  Fruits  All fresh, dried, or frozen fruit. Canned fruit in natural juice (without  added sugar).  Meat and other protein foods  Skinless chicken or turkey. Ground chicken or turkey. Pork with fat trimmed off. Fish and seafood. Egg whites. Dried beans, peas, or lentils. Unsalted nuts, nut butters, and seeds. Unsalted canned beans. Lean cuts of beef with fat trimmed off. Low-sodium, lean deli meat.  Dairy  Low-fat (1%) or fat-free (skim) milk. Fat-free, low-fat, or reduced-fat cheeses. Nonfat, low-sodium ricotta or cottage cheese. Low-fat or nonfat yogurt. Low-fat, low-sodium cheese.  Fats and oils  Soft margarine without trans fats. Vegetable oil. Low-fat, reduced-fat, or light mayonnaise and salad dressings (reduced-sodium). Canola, safflower, olive, soybean, and sunflower oils. Avocado.  Seasoning and other foods  Herbs. Spices. Seasoning mixes without salt. Unsalted popcorn and pretzels. Fat-free sweets.  What foods are not recommended?  The items listed may not be a complete list. Talk with your dietitian about what dietary choices are best for you.  Grains  Baked goods made with fat, such as croissants, muffins, or some breads. Dry pasta or rice meal packs.  Vegetables  Creamed or fried vegetables. Vegetables in a cheese sauce. Regular canned vegetables (not low-sodium or reduced-sodium). Regular canned tomato sauce and paste (not low-sodium or reduced-sodium). Regular tomato and vegetable juice (not low-sodium or reduced-sodium). Pickles. Olives.  Fruits  Canned fruit in a light or heavy syrup. Fried fruit. Fruit in cream or butter sauce.  Meat and other protein foods  Fatty cuts of meat. Ribs. Fried meat. Bacon. Sausage. Bologna and other processed lunch meats. Salami. Fatback. Hotdogs. Bratwurst. Salted nuts and seeds. Canned beans with added salt. Canned or smoked fish. Whole eggs or egg yolks. Chicken or turkey with skin.  Dairy  Whole or 2% milk, cream, and half-and-half. Whole or full-fat cream cheese. Whole-fat or sweetened yogurt. Full-fat cheese. Nondairy creamers. Whipped toppings.  Processed cheese and cheese spreads.  Fats and oils  Butter. Stick margarine. Lard. Shortening. Ghee. Bacon fat. Tropical oils, such as coconut, palm kernel, or palm oil.  Seasoning and other foods  Salted popcorn and pretzels. Onion salt, garlic salt, seasoned salt, table salt, and sea salt. Worcestershire sauce. Tartar sauce. Barbecue sauce. Teriyaki sauce. Soy sauce, including reduced-sodium. Steak sauce. Canned and packaged gravies. Fish sauce. Oyster sauce. Cocktail sauce. Horseradish that you find on the shelf. Ketchup. Mustard. Meat flavorings and tenderizers. Bouillon cubes. Hot sauce and Tabasco sauce. Premade or packaged marinades. Premade or packaged taco seasonings. Relishes. Regular salad dressings.  Where to find more information:   National Heart, Lung, and Blood Institute: www.nhlbi.nih.gov   American Heart Association: www.heart.org  Summary   The DASH eating plan is a healthy eating plan that has been shown to reduce high blood pressure (hypertension). It may also reduce your risk for type 2 diabetes, heart disease, and stroke.   With the   DASH eating plan, you should limit salt (sodium) intake to 2,300 mg a day. If you have hypertension, you may need to reduce your sodium intake to 1,500 mg a day.   When on the DASH eating plan, aim to eat more fresh fruits and vegetables, whole grains, lean proteins, low-fat dairy, and heart-healthy fats.   Work with your health care provider or diet and nutrition specialist (dietitian) to adjust your eating plan to your individual calorie needs.  This information is not intended to replace advice given to you by your health care provider. Make sure you discuss any questions you have with your health care provider.  Document Released: 10/24/2011 Document Revised: 10/28/2016 Document Reviewed: 10/28/2016  Elsevier Interactive Patient Education  2019 Elsevier Inc[employer]

## 2019-03-28 NOTE — Progress Notes (Signed)
Subjective:     Patient ID: [name] , female    DOB: 10/30/1946 , 73 y.o.   MRN: [id number]   Chief Complaint  Patient presents with  . Hypertension    HPI  She is here today for bp check. She reports she has been taking lisinopril daily.   Hypertension  The current episode started more than 1 year ago. The problem has been gradually improving since onset. The problem is controlled. Pertinent negatives include no blurred vision, chest pain, palpitations or shortness of breath. Risk factors for coronary artery disease include obesity and sedentary lifestyle. The current treatment provides mild improvement.     Past Medical History:  Diagnosis Date  . Falls   . Hypertension   . Varicose veins of both lower extremities   . Vitamin D deficiency      Family History  Problem Relation Age of Onset  . Hypertension Mother   . Hypertension Father   . Cancer Father   . Diabetes Brother   . Breast cancer Paternal Aunt   . Breast cancer Paternal Grandmother      Current Outpatient Medications:  .  acetaminophen (TYLENOL) 500 MG tablet, Take 1,000 mg by mouth daily as needed for moderate pain., Disp: , Rfl:  .  CALCIUM PO, Take by mouth., Disp: , Rfl:  .  Diclofenac Sodium (PENNSAID) 2 % SOLN, Place onto the skin., Disp: , Rfl:  .  lisinopril (PRINIVIL,ZESTRIL) 2.5 MG tablet, Take 2 tablets by mouth daily, Disp: 180 tablet, Rfl: 1 .  Multiple Vitamin (MULTIVITAMIN) capsule, Take 1 capsule by mouth daily., Disp: , Rfl:  .  VITAMIN D PO, Take by mouth., Disp: , Rfl:    No Known Allergies   Review of Systems  Constitutional: Negative.   Eyes: Negative for blurred vision.  Respiratory: Negative.  Negative for shortness of breath.   Cardiovascular: Negative.  Negative for chest pain and palpitations.  Gastrointestinal: Negative.   Musculoskeletal: Positive for arthralgias (she c/o b/l foot pain. there is pain w/ ambulation. she was seen by Dr. Sarita Haver was not satisfied,  plans to f/u with Ortho).  Neurological: Positive for numbness (she c/o numbness on b/l lateral thighs. denies recent fall/trauma. there is no pain w/ ambulation. ).  Psychiatric/Behavioral: Negative.      Today's Vitals   03/25/19 1036  BP: (!) 140/92  Pulse: 69  Temp: 98.4 F (36.9 C)  TempSrc: Oral  Height: 5\' 7"  (1.702 m)  PainSc: 7   PainLoc: Knee   Body mass index is 32.89 kg/m.   Objective:  Physical Exam Vitals signs and nursing note reviewed.  Constitutional:      Appearance: Normal appearance.  HENT:     Head: Normocephalic and atraumatic.  Cardiovascular:     Rate and Rhythm: Normal rate and regular rhythm.     Heart sounds: Normal heart sounds.  Pulmonary:     Effort: Pulmonary effort is normal.     Breath sounds: Normal breath sounds.  Skin:    General: Skin is warm.  Neurological:     General: No focal deficit present.     Mental Status: She is alert.  Psychiatric:        Mood and Affect: Mood normal.        Behavior: Behavior normal.         Assessment And Plan:     1. Essential hypertension, benign  Uncontrolled. Despite my recommendation, she does not wish to increase her meds. She will  continue with lisinopril, 2.5mg , 2 tabs nightly. Importance of medication, dietary and exercise compliance was discussed with the patient. She will rto in four to six weeks for re-evaluation. If needed, I plan to increase her to 10mg  lisinopril nightly.   2. Meralgia paresthetica of both lower extremities  She is advised to try OTC lidocaine patches as needed. She will let me know if her sx persist.   3. Foot pain, bilateral  She is encouraged to resume golden milk to help decrease inflammation. She will f/u with Ortho.   Maximino Greenland, MD    THE PATIENT IS ENCOURAGED TO PRACTICE SOCIAL DISTANCING DUE TO THE COVID-19 PANDEMIC.

## 2019-04-16 ENCOUNTER — Ambulatory Visit: Payer: Medicare HMO

## 2019-04-26 ENCOUNTER — Ambulatory Visit (INDEPENDENT_AMBULATORY_CARE_PROVIDER_SITE_OTHER): Payer: Medicare HMO | Admitting: Internal Medicine

## 2019-04-26 ENCOUNTER — Other Ambulatory Visit: Payer: Self-pay

## 2019-04-26 ENCOUNTER — Encounter: Payer: Self-pay | Admitting: Internal Medicine

## 2019-04-26 VITALS — BP 150/78 | HR 80 | Temp 98.2°F

## 2019-04-26 DIAGNOSIS — I10 Essential (primary) hypertension: Secondary | ICD-10-CM | POA: Diagnosis not present

## 2019-04-26 MED ORDER — LISINOPRIL 10 MG PO TABS: 10.0000 mg | ORAL_TABLET | Freq: Every day | ORAL | 1 refills | Status: DC

## 2019-04-26 NOTE — Patient Instructions (Signed)
DASH Eating Plan  DASH stands for "Dietary Approaches to Stop Hypertension." The DASH eating plan is a healthy eating plan that has been shown to reduce high blood pressure (hypertension). It may also reduce your risk for type 2 diabetes, heart disease, and stroke. The DASH eating plan may also help with weight loss.  What are tips for following this plan?    General guidelines   Avoid eating more than 2,300 mg (milligrams) of salt (sodium) a day. If you have hypertension, you may need to reduce your sodium intake to 1,500 mg a day.   Limit alcohol intake to no more than 1 drink a day for nonpregnant women and 2 drinks a day for men. One drink equals 12 oz of beer, 5 oz of wine, or 1 oz of hard liquor.   Work with your health care provider to maintain a healthy body weight or to lose weight. Ask what an ideal weight is for you.   Get at least 30 minutes of exercise that causes your heart to beat faster (aerobic exercise) most days of the week. Activities may include walking, swimming, or biking.   Work with your health care provider or diet and nutrition specialist (dietitian) to adjust your eating plan to your individual calorie needs.  Reading food labels     Check food labels for the amount of sodium per serving. Choose foods with less than 5 percent of the Daily Value of sodium. Generally, foods with less than 300 mg of sodium per serving fit into this eating plan.   To find whole grains, look for the word "whole" as the first word in the ingredient list.  Shopping   Buy products labeled as "low-sodium" or "no salt added."   Buy fresh foods. Avoid canned foods and premade or frozen meals.  Cooking   Avoid adding salt when cooking. Use salt-free seasonings or herbs instead of table salt or sea salt. Check with your health care provider or pharmacist before using salt substitutes.   Do not fry foods. Cook foods using healthy methods such as baking, boiling, grilling, and broiling instead.   Cook with  heart-healthy oils, such as olive, canola, soybean, or sunflower oil.  Meal planning   Eat a balanced diet that includes:  ? 5 or more servings of fruits and vegetables each day. At each meal, try to fill half of your plate with fruits and vegetables.  ? Up to 6-8 servings of whole grains each day.  ? Less than 6 oz of lean meat, poultry, or fish each day. A 3-oz serving of meat is about the same size as a deck of cards. One egg equals 1 oz.  ? 2 servings of low-fat dairy each day.  ? A serving of nuts, seeds, or beans 5 times each week.  ? Heart-healthy fats. Healthy fats called Omega-3 fatty acids are found in foods such as flaxseeds and coldwater fish, like sardines, salmon, and mackerel.   Limit how much you eat of the following:  ? Canned or prepackaged foods.  ? Food that is high in trans fat, such as fried foods.  ? Food that is high in saturated fat, such as fatty meat.  ? Sweets, desserts, sugary drinks, and other foods with added sugar.  ? Full-fat dairy products.   Do not salt foods before eating.   Try to eat at least 2 vegetarian meals each week.   Eat more home-cooked food and less restaurant, buffet, and fast food.     When eating at a restaurant, ask that your food be prepared with less salt or no salt, if possible.  What foods are recommended?  The items listed may not be a complete list. Talk with your dietitian about what dietary choices are best for you.  Grains  Whole-grain or whole-wheat bread. Whole-grain or whole-wheat pasta. Brown rice. Oatmeal. Quinoa. Bulgur. Whole-grain and low-sodium cereals. Pita bread. Low-fat, low-sodium crackers. Whole-wheat flour tortillas.  Vegetables  Fresh or frozen vegetables (raw, steamed, roasted, or grilled). Low-sodium or reduced-sodium tomato and vegetable juice. Low-sodium or reduced-sodium tomato sauce and tomato paste. Low-sodium or reduced-sodium canned vegetables.  Fruits  All fresh, dried, or frozen fruit. Canned fruit in natural juice (without  added sugar).  Meat and other protein foods  Skinless chicken or turkey. Ground chicken or turkey. Pork with fat trimmed off. Fish and seafood. Egg whites. Dried beans, peas, or lentils. Unsalted nuts, nut butters, and seeds. Unsalted canned beans. Lean cuts of beef with fat trimmed off. Low-sodium, lean deli meat.  Dairy  Low-fat (1%) or fat-free (skim) milk. Fat-free, low-fat, or reduced-fat cheeses. Nonfat, low-sodium ricotta or cottage cheese. Low-fat or nonfat yogurt. Low-fat, low-sodium cheese.  Fats and oils  Soft margarine without trans fats. Vegetable oil. Low-fat, reduced-fat, or light mayonnaise and salad dressings (reduced-sodium). Canola, safflower, olive, soybean, and sunflower oils. Avocado.  Seasoning and other foods  Herbs. Spices. Seasoning mixes without salt. Unsalted popcorn and pretzels. Fat-free sweets.  What foods are not recommended?  The items listed may not be a complete list. Talk with your dietitian about what dietary choices are best for you.  Grains  Baked goods made with fat, such as croissants, muffins, or some breads. Dry pasta or rice meal packs.  Vegetables  Creamed or fried vegetables. Vegetables in a cheese sauce. Regular canned vegetables (not low-sodium or reduced-sodium). Regular canned tomato sauce and paste (not low-sodium or reduced-sodium). Regular tomato and vegetable juice (not low-sodium or reduced-sodium). Pickles. Olives.  Fruits  Canned fruit in a light or heavy syrup. Fried fruit. Fruit in cream or butter sauce.  Meat and other protein foods  Fatty cuts of meat. Ribs. Fried meat. Bacon. Sausage. Bologna and other processed lunch meats. Salami. Fatback. Hotdogs. Bratwurst. Salted nuts and seeds. Canned beans with added salt. Canned or smoked fish. Whole eggs or egg yolks. Chicken or turkey with skin.  Dairy  Whole or 2% milk, cream, and half-and-half. Whole or full-fat cream cheese. Whole-fat or sweetened yogurt. Full-fat cheese. Nondairy creamers. Whipped toppings.  Processed cheese and cheese spreads.  Fats and oils  Butter. Stick margarine. Lard. Shortening. Ghee. Bacon fat. Tropical oils, such as coconut, palm kernel, or palm oil.  Seasoning and other foods  Salted popcorn and pretzels. Onion salt, garlic salt, seasoned salt, table salt, and sea salt. Worcestershire sauce. Tartar sauce. Barbecue sauce. Teriyaki sauce. Soy sauce, including reduced-sodium. Steak sauce. Canned and packaged gravies. Fish sauce. Oyster sauce. Cocktail sauce. Horseradish that you find on the shelf. Ketchup. Mustard. Meat flavorings and tenderizers. Bouillon cubes. Hot sauce and Tabasco sauce. Premade or packaged marinades. Premade or packaged taco seasonings. Relishes. Regular salad dressings.  Where to find more information:   National Heart, Lung, and Blood Institute: www.nhlbi.nih.gov   American Heart Association: www.heart.org  Summary   The DASH eating plan is a healthy eating plan that has been shown to reduce high blood pressure (hypertension). It may also reduce your risk for type 2 diabetes, heart disease, and stroke.   With the   DASH eating plan, you should limit salt (sodium) intake to 2,300 mg a day. If you have hypertension, you may need to reduce your sodium intake to 1,500 mg a day.   When on the DASH eating plan, aim to eat more fresh fruits and vegetables, whole grains, lean proteins, low-fat dairy, and heart-healthy fats.   Work with your health care provider or diet and nutrition specialist (dietitian) to adjust your eating plan to your individual calorie needs.  This information is not intended to replace advice given to you by your health care provider. Make sure you discuss any questions you have with your health care provider.  Document Released: 10/24/2011 Document Revised: 10/28/2016 Document Reviewed: 10/28/2016  Elsevier Interactive Patient Education  2019 Elsevier Inc[employer]

## 2019-04-26 NOTE — Progress Notes (Signed)
Subjective:     Patient ID: [name] , female    DOB: 08/29/1946 , 73 y.o.   MRN: [id number]   Chief Complaint  Patient presents with  . Hypertension    HPI  She is here today for a bp check. She has been taking lisinopril, 2.37m - 2 tabs nightly. She has not had any issues with the medication. She admits that she is still getting elevated blood pressure readings at home. She admits that she sometimes takes 3 tabs at night if her bp is markedly elevated.     Past Medical History:  Diagnosis Date  . Falls   . Hypertension   . Varicose veins of both lower extremities   . Vitamin D deficiency      Family History  Problem Relation Age of Onset  . Hypertension Mother   . Hypertension Father   . Cancer Father   . Diabetes Brother   . Breast cancer Paternal Aunt   . Breast cancer Paternal Grandmother      Current Outpatient Medications:  .  acetaminophen (TYLENOL) 500 MG tablet, Take 1,000 mg by mouth daily as needed for moderate pain., Disp: , Rfl:  .  CALCIUM PO, Take by mouth., Disp: , Rfl:  .  Diclofenac Sodium (PENNSAID) 2 % SOLN, Place onto the skin., Disp: , Rfl:  .  Magnesium 100 MG CAPS, Take by mouth., Disp: , Rfl:  .  Multiple Vitamin (MULTIVITAMIN) capsule, Take 1 capsule by mouth daily., Disp: , Rfl:  .  VITAMIN D PO, Take by mouth., Disp: , Rfl:  .  lisinopril (ZESTRIL) 10 MG tablet, Take 1 tablet (10 mg total) by mouth daily., Disp: 30 tablet, Rfl: 1   No Known Allergies   Review of Systems  Constitutional: Negative.   Respiratory: Negative.   Cardiovascular: Negative.   Gastrointestinal: Negative.   Neurological: Negative.   Psychiatric/Behavioral: Negative.      Today's Vitals   04/26/19 1200  BP: (!) 150/78  Pulse: 80  Temp: 98.2 F (36.8 C)  TempSrc: Oral  SpO2: 98%   There is no height or weight on file to calculate BMI.   Objective:  Physical Exam Vitals signs and nursing note reviewed.  Constitutional:      Appearance:  Normal appearance.  HENT:     Head: Normocephalic and atraumatic.  Cardiovascular:     Rate and Rhythm: Normal rate and regular rhythm.     Heart sounds: Normal heart sounds.  Pulmonary:     Effort: Pulmonary effort is normal.     Breath sounds: Normal breath sounds.  Skin:    General: Skin is warm.  Neurological:     General: No focal deficit present.     Mental Status: She is alert.  Psychiatric:        Mood and Affect: Mood normal.        Behavior: Behavior normal.         Assessment And Plan:     1. Essential hypertension, benign  Uncontrolled.  She is advised to increase her dose to lisinopril, 169mnightly. She will take 4 - 2.60m54mablets nightly until she runs out. Importance of medication and dietary compliance was discussed with the patient. She agrees to rto in six weeks for re-evaluation. She is encouraged to increase her daily activity, reports this is difficult due to knee pain. She is encouraged to seek out water exercises.   - BMP8+EGFR        RobTheda [place]  Baird Cancer, MD    THE PATIENT IS ENCOURAGED TO PRACTICE SOCIAL DISTANCING DUE TO THE COVID-19 PANDEMIC.

## 2019-04-27 LAB — BMP8+EGFR
BUN/Creatinine Ratio: 12 (ref 12–28)
BUN: 12 mg/dL (ref 8–27)
CO2: 22 mmol/L (ref 20–29)
Calcium: 9.7 mg/dL (ref 8.7–10.3)
Chloride: 104 mmol/L (ref 96–106)
Creatinine, Ser: 0.99 mg/dL (ref 0.57–1.00)
GFR calc Af Amer: 66 mL/min/{1.73_m2} (ref >59)
GFR calc non Af Amer: 57 mL/min/{1.73_m2} — ABNORMAL LOW (ref >59)
Glucose: 106 mg/dL — ABNORMAL HIGH (ref 65–99)
Potassium: 4.5 mmol/L (ref 3.5–5.2)
Sodium: 139 mmol/L (ref 134–144)

## 2019-05-03 ENCOUNTER — Other Ambulatory Visit: Payer: Self-pay

## 2019-05-03 ENCOUNTER — Ambulatory Visit
Admission: RE | Admit: 2019-05-03 | Discharge: 2019-05-03 | Disposition: A | Discharge status: Home / Self Care | Payer: Medicare HMO | Source: Ambulatory Visit | Attending: Nurse Practitioner | Admitting: Nurse Practitioner

## 2019-05-03 DIAGNOSIS — Z1231 Encounter for screening mammogram for malignant neoplasm of breast: Secondary | ICD-10-CM

## 2019-05-05 ENCOUNTER — Other Ambulatory Visit: Payer: Self-pay

## 2019-05-05 MED ORDER — HYDROCHLOROTHIAZIDE 25 MG PO TABS: 25.0000 mg | ORAL_TABLET | Freq: Every day | ORAL | 1 refills | Status: DC

## 2019-05-05 NOTE — Telephone Encounter (Signed)
I left the pt a message that Dr. Baird Cancer has faxed in a prescription for hydrochlorothiazide 25 mg to be added to the pt's current blood pressure medication

## 2019-06-16 ENCOUNTER — Other Ambulatory Visit: Payer: Self-pay

## 2019-06-16 ENCOUNTER — Ambulatory Visit (INDEPENDENT_AMBULATORY_CARE_PROVIDER_SITE_OTHER): Payer: Medicare HMO | Admitting: Internal Medicine

## 2019-06-16 ENCOUNTER — Encounter: Payer: Self-pay | Admitting: Internal Medicine

## 2019-06-16 VITALS — Temp 98.4°F | Ht 67.0 in

## 2019-06-16 DIAGNOSIS — R42 Dizziness and giddiness: Secondary | ICD-10-CM

## 2019-06-16 DIAGNOSIS — Z79899 Other long term (current) drug therapy: Secondary | ICD-10-CM

## 2019-06-16 DIAGNOSIS — R202 Paresthesia of skin: Secondary | ICD-10-CM | POA: Diagnosis not present

## 2019-06-16 DIAGNOSIS — I10 Essential (primary) hypertension: Secondary | ICD-10-CM | POA: Diagnosis not present

## 2019-06-16 NOTE — Patient Instructions (Signed)
Orthostatic Hypotension °Blood pressure is a measurement of how strongly, or weakly, your blood is pressing against the walls of your arteries. Orthostatic hypotension is a sudden drop in blood pressure that happens when you quickly change positions, such as when you get up from sitting or lying down. °Arteries are blood vessels that carry blood from your heart throughout your body. When blood pressure is too low, you may not get enough blood to your brain or to the rest of your organs. This can cause weakness, light-headedness, rapid heartbeat, and fainting. This can last for just a few seconds or for up to a few minutes. Orthostatic hypotension is usually not a serious problem. However, if it happens frequently or gets worse, it may be a sign of something more serious. °What are the causes? °This condition may be caused by: °· Sudden changes in posture, such as standing up quickly after you have been sitting or lying down. °· Blood loss. °· Loss of body fluids (dehydration). °· Heart problems. °· Hormone (endocrine) problems. °· Pregnancy. °· Severe infection. °· Lack of certain nutrients. °· Severe allergic reactions (anaphylaxis). °· Certain medicines, such as blood pressure medicine or medicines that make the body lose excess fluids (diuretics). Sometimes, this condition can be caused by not taking medicine as directed, such as taking too much of a certain medicine. °What increases the risk? °The following factors may make you more likely to develop this condition: °· Age. Risk increases as you get older. °· Conditions that affect the heart or the central nervous system. °· Taking certain medicines, such as blood pressure medicine or diuretics. °· Being pregnant. °What are the signs or symptoms? °Symptoms of this condition may include: °· Weakness. °· Light-headedness. °· Dizziness. °· Blurred vision. °· Fatigue. °· Rapid heartbeat. °· Fainting, in severe cases. °How is this diagnosed? °This condition is  diagnosed based on: °· Your medical history. °· Your symptoms. °· Your blood pressure measurement. Your health care provider will check your blood pressure when you are: °? Lying down. °? Sitting. °? Standing. °A blood pressure reading is recorded as two numbers, such as "120 over 80" (or 120/80). The first ("top") number is called the systolic pressure. It is a measure of the pressure in your arteries as your heart beats. The second ("bottom") number is called the diastolic pressure. It is a measure of the pressure in your arteries when your heart relaxes between beats. Blood pressure is measured in a unit called mm Hg. Healthy blood pressure for most adults is 120/80. If your blood pressure is below 90/60, you may be diagnosed with hypotension. °Other information or tests that may be used to diagnose orthostatic hypotension include: °· Your other vital signs, such as your heart rate and temperature. °· Blood tests. °· Tilt table test. For this test, you will be safely secured to a table that moves you from a lying position to an upright position. Your heart rhythm and blood pressure will be monitored during the test. °How is this treated? °This condition may be treated by: °· Changing your diet. This may involve eating more salt (sodium) or drinking more water. °· Taking medicines to raise your blood pressure. °· Changing the dosage of certain medicines you are taking that might be lowering your blood pressure. °· Wearing compression stockings. These stockings help to prevent blood clots and reduce swelling in your legs. °In some cases, you may need to go to the hospital for: °· Fluid replacement. This means you will   receive fluids through an IV. °· Blood replacement. This means you will receive donated blood through an IV (transfusion). °· Treating an infection or heart problems, if this applies. °· Monitoring. You may need to be monitored while medicines that you are taking wear off. °Follow these instructions  at home: °Eating and drinking ° °· Drink enough fluid to keep your urine pale yellow. °· Eat a healthy diet, and follow instructions from your health care provider about eating or drinking restrictions. A healthy diet includes: °? Fresh fruits and vegetables. °? Whole grains. °? Lean meats. °? Low-fat dairy products. °· Eat extra salt only as directed. Do not add extra salt to your diet unless your health care provider told you to do that. °· Eat frequent, small meals. °· Avoid standing up suddenly after eating. °Medicines °· Take over-the-counter and prescription medicines only as told by your health care provider. °? Follow instructions from your health care provider about changing the dosage of your current medicines, if this applies. °? Do not stop or adjust any of your medicines on your own. °General instructions ° °· Wear compression stockings as told by your health care provider. °· Get up slowly from lying down or sitting positions. This gives your blood pressure a chance to adjust. °· Avoid hot showers and excessive heat as directed by your health care provider. °· Return to your normal activities as told by your health care provider. Ask your health care provider what activities are safe for you. °· Do not use any products that contain nicotine or tobacco, such as cigarettes, e-cigarettes, and chewing tobacco. If you need help quitting, ask your health care provider. °· Keep all follow-up visits as told by your health care provider. This is important. °Contact a health care provider if you: °· Vomit. °· Have diarrhea. °· Have a fever for more than 2-3 days. °· Feel more thirsty than usual. °· Feel weak and tired. °Get help right away if you: °· Have chest pain. °· Have a fast or irregular heartbeat. °· Develop numbness in any part of your body. °· Cannot move your arms or your legs. °· Have trouble speaking. °· Become sweaty or feel light-headed. °· Faint. °· Feel short of breath. °· Have trouble staying  awake. °· Feel confused. °Summary °· Orthostatic hypotension is a sudden drop in blood pressure that happens when you quickly change positions. °· Orthostatic hypotension is usually not a serious problem. °· It is diagnosed by having your blood pressure taken lying down, sitting, and then standing. °· It may be treated by changing your diet or adjusting your medicines. °This information is not intended to replace advice given to you by your health care provider. Make sure you discuss any questions you have with your health care provider. °Document Released: 10/25/2002 Document Revised: 04/30/2018 Document Reviewed: 04/30/2018 °Elsevier Patient Education © 2020 [employer] ° °

## 2019-06-16 NOTE — Progress Notes (Signed)
Subjective:     Patient ID: [name] , female    DOB: [birth year] , 73 y.o.   MRN: [id number]   Chief Complaint  Patient presents with  . Hypertension  . Dizziness    HPI  She is here today for bp check. The dose of lisinopril and hctz was increased at last visit. She reports that she had episode of dizziness this past weekend. Her friend came over, who is a [employer] scientist, who told her to stop lisinopril. She continue to take diuretic. She denies having any episodes of cp, sob and palpitations.   Hypertension This is a chronic problem. The current episode started more than 1 month ago. The problem has been gradually improving since onset. The problem is uncontrolled. Pertinent negatives include no blurred vision, palpitations or shortness of breath. Risk factors for coronary artery disease include obesity, post-menopausal state and sedentary lifestyle. Past treatments include ACE inhibitors and diuretics. The current treatment provides moderate improvement.  Dizziness Associated symptoms include numbness.     Past Medical History:  Diagnosis Date  . Falls   . Hypertension   . Varicose veins of both lower extremities   . Vitamin D deficiency      Family History  Problem Relation Age of Onset  . Hypertension Mother   . Hypertension Father   . Cancer Father   . Diabetes Brother   . Breast cancer Paternal Aunt   . Breast cancer Paternal Grandmother      Current Outpatient Medications:  .  acetaminophen (TYLENOL) 500 MG tablet, Take 1,000 mg by mouth daily as needed for moderate pain., Disp: , Rfl:  .  CALCIUM PO, Take by mouth., Disp: , Rfl:  .  Diclofenac Sodium (PENNSAID) 2 % SOLN, Place onto the skin., Disp: , Rfl:  .  hydrochlorothiazide (HYDRODIURIL) 25 MG tablet, Take 1 tablet (25 mg total) by mouth daily., Disp: 30 tablet, Rfl: 1 .  Magnesium 100 MG CAPS, Take by mouth., Disp: , Rfl:  .  Multiple Vitamin (MULTIVITAMIN) capsule, Take 1 capsule by mouth daily., Disp: ,  Rfl:  .  VITAMIN D PO, Take by mouth., Disp: , Rfl:  .  lisinopril (ZESTRIL) 10 MG tablet, Take 1 tablet (10 mg total) by mouth daily. (Patient not taking: Reported on 06/16/2019), Disp: 30 tablet, Rfl: 1   No Known Allergies   Review of Systems  Constitutional: Negative.   Eyes: Negative for blurred vision.  Respiratory: Negative.  Negative for shortness of breath.   Cardiovascular: Negative.  Negative for palpitations.  Gastrointestinal: Negative.   Neurological: Positive for dizziness and numbness.       She c/o tingling in b/l UE, also occurs in b/l LE. She is unable to state what triggers these sx. She denies UE/LE weakness.   Psychiatric/Behavioral: Negative.      Today's Vitals   06/16/19 1052  Temp: 98.4 F (36.9 C)  TempSrc: Oral  Height: 5\' 7"  (1.702 m)   Body mass index is 32.89 kg/m.   Objective:  Physical Exam Vitals signs and nursing note reviewed.  Constitutional:      Appearance: Normal appearance.  HENT:     Head: Normocephalic and atraumatic.  Cardiovascular:     Rate and Rhythm: Normal rate and regular rhythm.     Heart sounds: Normal heart sounds.  Pulmonary:     Effort: Pulmonary effort is normal.     Breath sounds: Normal breath sounds.  Skin:    General: Skin is warm.  Neurological:  General: No focal deficit present.     Mental Status: She is alert.  Psychiatric:        Mood and Affect: Mood normal.        Behavior: Behavior normal.         Assessment And Plan:     1. Essential hypertension, benign  Chronic. She was advised to stop hctz and to continue with lisinopril 10mg  daily.  She will rto in six weeks for re-evaluation. She is encouraged to stay well hydrated.   - ABO AND RH   2. Dizziness  Orthostatics were performed, this was positive. Pt advised that her sx are likely due to dehydration. Advised to stop hctz for now. She will continue to keep record of her bp readings.   3. Paresthesias  This is a new symptom. I will  check thyroid function and vitamin B12 level today.  I will make further recommendations once her labs are available for review.   - TSH  4. Drug therapy  - Vitamin B12  Maximino Greenland, MD    THE PATIENT IS ENCOURAGED TO PRACTICE SOCIAL DISTANCING DUE TO THE COVID-19 PANDEMIC.

## 2019-06-17 LAB — ABO AND RH: Rh Factor: POSITIVE

## 2019-06-17 LAB — TSH: TSH: 2.11 u[IU]/mL (ref 0.450–4.500)

## 2019-06-17 LAB — VITAMIN B12: Vitamin B-12: 217 pg/mL — ABNORMAL LOW (ref 232–1245)

## 2019-06-18 ENCOUNTER — Telehealth: Payer: Self-pay

## 2019-06-18 ENCOUNTER — Ambulatory Visit (INDEPENDENT_AMBULATORY_CARE_PROVIDER_SITE_OTHER): Payer: Medicare HMO

## 2019-06-18 ENCOUNTER — Other Ambulatory Visit: Payer: Self-pay

## 2019-06-18 VITALS — BP 120/72 | HR 70 | Temp 97.7°F

## 2019-06-18 DIAGNOSIS — E538 Deficiency of other specified B group vitamins: Secondary | ICD-10-CM | POA: Diagnosis not present

## 2019-06-18 DIAGNOSIS — R7989 Other specified abnormal findings of blood chemistry: Secondary | ICD-10-CM

## 2019-06-18 MED ORDER — CYANOCOBALAMIN 1000 MCG/ML IJ SOLN
1000.0000 ug | Freq: Once | INTRAMUSCULAR | Status: AC
  Administered 2019-06-18: 1000 ug via INTRAMUSCULAR

## 2019-06-18 NOTE — Telephone Encounter (Signed)
-----   Message from Glendale Chard, MD sent at 06/17/2019 12:00 PM EDT ----- Here are your lab results:  Your thyroid function is normal. Your vitamin B12 level is low. This is why you are having numbness/tingling in your hands/feet. If we do not treat this, it can lead to cognitive and balance issues. Usually, treatment is weekly b12 injections x 4 weeks, then once monthly x 4.   You may come in on Friday for a B12 injection between 9-11. Please confirm that you have read this so we can put you on the schedule.   Your blood type is O positive.   Sincerely,    Robyn N. Baird [facility], MD

## 2019-06-18 NOTE — Telephone Encounter (Signed)
Left the patient a message to call back for lab results. 

## 2019-06-23 ENCOUNTER — Ambulatory Visit: Payer: Medicare HMO

## 2019-06-23 ENCOUNTER — Other Ambulatory Visit: Payer: Self-pay

## 2019-06-23 DIAGNOSIS — Z20822 Contact with and (suspected) exposure to covid-19: Secondary | ICD-10-CM

## 2019-06-24 LAB — NOVEL CORONAVIRUS, NAA: SARS-CoV-2, NAA: NOT DETECTED

## 2019-06-25 ENCOUNTER — Other Ambulatory Visit: Payer: Self-pay

## 2019-06-25 ENCOUNTER — Ambulatory Visit (INDEPENDENT_AMBULATORY_CARE_PROVIDER_SITE_OTHER): Payer: Medicare HMO

## 2019-06-25 VITALS — BP 134/80 | HR 74 | Temp 98.0°F

## 2019-06-25 DIAGNOSIS — E538 Deficiency of other specified B group vitamins: Secondary | ICD-10-CM | POA: Diagnosis not present

## 2019-06-25 MED ORDER — CYANOCOBALAMIN 1000 MCG/ML IJ SOLN
1000.0000 ug | Freq: Once | INTRAMUSCULAR | Status: AC
  Administered 2019-06-25: 1000 ug via INTRAMUSCULAR

## 2019-06-25 NOTE — Progress Notes (Signed)
Pt came in for second b12 injection. Encouraged to call office for any concerns .

## 2019-06-30 ENCOUNTER — Other Ambulatory Visit: Payer: Self-pay

## 2019-06-30 ENCOUNTER — Ambulatory Visit: Payer: Medicare HMO

## 2019-06-30 VITALS — BP 130/78 | HR 64 | Temp 97.8°F

## 2019-06-30 DIAGNOSIS — E538 Deficiency of other specified B group vitamins: Secondary | ICD-10-CM

## 2019-06-30 MED ORDER — CYANOCOBALAMIN 1000 MCG/ML IJ SOLN
1000.0000 ug | Freq: Once | INTRAMUSCULAR | Status: AC
  Administered 2019-07-08: 1000 ug via INTRAMUSCULAR

## 2019-06-30 NOTE — Progress Notes (Signed)
Pt came in today for 3rd b12 injection. Encouraged to call the office for any concerns

## 2019-07-07 ENCOUNTER — Ambulatory Visit: Payer: Medicare HMO

## 2019-07-08 ENCOUNTER — Ambulatory Visit (INDEPENDENT_AMBULATORY_CARE_PROVIDER_SITE_OTHER): Payer: Medicare HMO

## 2019-07-08 ENCOUNTER — Other Ambulatory Visit: Payer: Self-pay

## 2019-07-08 VITALS — BP 142/78 | HR 77 | Temp 97.9°F

## 2019-07-08 DIAGNOSIS — E538 Deficiency of other specified B group vitamins: Secondary | ICD-10-CM | POA: Diagnosis not present

## 2019-07-08 MED ORDER — CYANOCOBALAMIN 1000 MCG/ML IJ SOLN
1000.0000 ug | Freq: Once | INTRAMUSCULAR | Status: AC
  Administered 2019-07-08: 1000 ug via INTRAMUSCULAR

## 2019-07-08 NOTE — Progress Notes (Signed)
Patient is here today for fourth b12 injection. Injection was given in the right deltoid. Patient advised to call the office as needed for any concerns.

## 2019-07-13 DIAGNOSIS — M19071 Primary osteoarthritis, right ankle and foot: Secondary | ICD-10-CM | POA: Diagnosis not present

## 2019-07-13 DIAGNOSIS — M25522 Pain in left elbow: Secondary | ICD-10-CM | POA: Diagnosis not present

## 2019-07-13 DIAGNOSIS — M19012 Primary osteoarthritis, left shoulder: Secondary | ICD-10-CM | POA: Diagnosis not present

## 2019-07-13 DIAGNOSIS — M19072 Primary osteoarthritis, left ankle and foot: Secondary | ICD-10-CM | POA: Diagnosis not present

## 2019-07-13 DIAGNOSIS — M1712 Unilateral primary osteoarthritis, left knee: Secondary | ICD-10-CM | POA: Diagnosis not present

## 2019-07-15 ENCOUNTER — Ambulatory Visit (INDEPENDENT_AMBULATORY_CARE_PROVIDER_SITE_OTHER): Payer: Medicare HMO | Admitting: Internal Medicine

## 2019-07-15 ENCOUNTER — Ambulatory Visit (INDEPENDENT_AMBULATORY_CARE_PROVIDER_SITE_OTHER): Payer: Medicare HMO

## 2019-07-15 ENCOUNTER — Other Ambulatory Visit: Payer: Self-pay

## 2019-07-15 ENCOUNTER — Encounter: Payer: Self-pay | Admitting: Internal Medicine

## 2019-07-15 VITALS — BP 142/78 | HR 78 | Temp 98.1°F | Ht 68.0 in

## 2019-07-15 DIAGNOSIS — E559 Vitamin D deficiency, unspecified: Secondary | ICD-10-CM

## 2019-07-15 DIAGNOSIS — R635 Abnormal weight gain: Secondary | ICD-10-CM

## 2019-07-15 DIAGNOSIS — E538 Deficiency of other specified B group vitamins: Secondary | ICD-10-CM

## 2019-07-15 DIAGNOSIS — Z0000 Encounter for general adult medical examination without abnormal findings: Secondary | ICD-10-CM

## 2019-07-15 DIAGNOSIS — I10 Essential (primary) hypertension: Secondary | ICD-10-CM | POA: Diagnosis not present

## 2019-07-15 DIAGNOSIS — Z23 Encounter for immunization: Secondary | ICD-10-CM | POA: Diagnosis not present

## 2019-07-15 DIAGNOSIS — R7309 Other abnormal glucose: Secondary | ICD-10-CM | POA: Diagnosis not present

## 2019-07-15 DIAGNOSIS — M1712 Unilateral primary osteoarthritis, left knee: Secondary | ICD-10-CM

## 2019-07-15 LAB — POCT UA - MICROALBUMIN
Albumin/Creatinine Ratio, Urine, POC: <30
Creatinine, POC: 300 mg/dL
Microalbumin Ur, POC: 30 mg/L

## 2019-07-15 LAB — POCT URINALYSIS DIPSTICK
Bilirubin, UA: NEGATIVE
Blood, UA: NEGATIVE
Glucose, UA: NEGATIVE
Ketones, UA: NEGATIVE
Leukocytes, UA: NEGATIVE
Nitrite, UA: NEGATIVE
Protein, UA: NEGATIVE
Spec Grav, UA: 1.025 (ref 1.010–1.025)
Urobilinogen, UA: 0.2 E.U./dL
pH, UA: 6 (ref 5.0–8.0)

## 2019-07-15 MED ORDER — LISINOPRIL 10 MG PO TABS: 10.0000 mg | ORAL_TABLET | Freq: Every day | ORAL | 1 refills | Status: DC

## 2019-07-15 MED ORDER — CYANOCOBALAMIN 1000 MCG/ML IJ SOLN
1000.0000 ug | Freq: Once | INTRAMUSCULAR | Status: AC
  Administered 2019-07-15: 11:00:00 1000 ug via INTRAMUSCULAR

## 2019-07-15 MED ORDER — HYDROCHLOROTHIAZIDE 25 MG PO TABS: 25.0000 mg | ORAL_TABLET | Freq: Every day | ORAL | 1 refills | Status: DC

## 2019-07-15 NOTE — Patient Instructions (Addendum)
Flu shot - no latex  Dr. Drue Second - [facility], chiropractic therapy  Oscillococcinum - homeopathic remedy for cold/flu   Health Maintenance, Female Adopting a healthy lifestyle and getting preventive care are important in promoting health and wellness. Ask your health care provider about:  The right schedule for you to have regular tests and exams.  Things you can do on your own to prevent diseases and keep yourself healthy. What should I know about diet, weight, and exercise? Eat a healthy diet   Eat a diet that includes plenty of vegetables, fruits, low-fat dairy products, and lean protein.  Do not eat a lot of foods that are high in solid fats, added sugars, or sodium. Maintain a healthy weight Body mass index (BMI) is used to identify weight problems. It estimates body fat based on height and weight. Your health care provider can help determine your BMI and help you achieve or maintain a healthy weight. Get regular exercise Get regular exercise. This is one of the most important things you can do for your health. Most adults should:  Exercise for at least 150 minutes each week. The exercise should increase your heart rate and make you sweat (moderate-intensity exercise).  Do strengthening exercises at least twice a week. This is in addition to the moderate-intensity exercise.  Spend less time sitting. Even light physical activity can be beneficial. Watch cholesterol and blood lipids Have your blood tested for lipids and cholesterol at 72 years of age, then have this test every 5 years. Have your cholesterol levels checked more often if:  Your lipid or cholesterol levels are high.  You are older than 72 years of age.  You are at high risk for heart disease. What should I know about cancer screening? Depending on your health history and family history, you may need to have cancer screening at various ages. This may include screening for:  Breast cancer.   Cervical cancer.  Colorectal cancer.  Skin cancer.  Lung cancer. What should I know about heart disease, diabetes, and high blood pressure? Blood pressure and heart disease  High blood pressure causes heart disease and increases the risk of stroke. This is more likely to develop in people who have high blood pressure readings, are of African descent, or are overweight.  Have your blood pressure checked: ? Every 3-5 years if you are 30-41 years of age. ? Every year if you are 9 years old or older. Diabetes Have regular diabetes screenings. This checks your fasting blood sugar level. Have the screening done:  Once every three years after age 64 if you are at a normal weight and have a low risk for diabetes.  More often and at a younger age if you are overweight or have a high risk for diabetes. What should I know about preventing infection? Hepatitis B If you have a higher risk for hepatitis B, you should be screened for this virus. Talk with your health care provider to find out if you are at risk for hepatitis B infection. Hepatitis C Testing is recommended for:  Everyone born from [birth year] through 1964.  Anyone with known risk factors for hepatitis C. Sexually transmitted infections (STIs)  Get screened for STIs, including gonorrhea and chlamydia, if: ? You are sexually active and are younger than 72 years of age. ? You are older than 72 years of age and your health care provider tells you that you are at risk for this type of infection. ? Your sexual activity  has changed since you were last screened, and you are at increased risk for chlamydia or gonorrhea. Ask your health care provider if you are at risk.  Ask your health care provider about whether you are at high risk for HIV. Your health care provider may recommend a prescription medicine to help prevent HIV infection. If you choose to take medicine to prevent HIV, you should first get tested for HIV. You should then be tested  every 3 months for as long as you are taking the medicine. Pregnancy  If you are about to stop having your period (premenopausal) and you may become pregnant, seek counseling before you get pregnant.  Take 400 to 800 micrograms (mcg) of folic acid every day if you become pregnant.  Ask for birth control (contraception) if you want to prevent pregnancy. Osteoporosis and menopause Osteoporosis is a disease in which the bones lose minerals and strength with aging. This can result in bone fractures. If you are 49 years old or older, or if you are at risk for osteoporosis and fractures, ask your health care provider if you should:  Be screened for bone loss.  Take a calcium or vitamin D supplement to lower your risk of fractures.  Be given hormone replacement therapy (HRT) to treat symptoms of menopause. Follow these instructions at home: Lifestyle  Do not use any products that contain nicotine or tobacco, such as cigarettes, e-cigarettes, and chewing tobacco. If you need help quitting, ask your health care provider.  Do not use street drugs.  Do not share needles.  Ask your health care provider for help if you need support or information about quitting drugs. Alcohol use  Do not drink alcohol if: ? Your health care provider tells you not to drink. ? You are pregnant, may be pregnant, or are planning to become pregnant.  If you drink alcohol: ? Limit how much you use to 0-1 drink a day. ? Limit intake if you are breastfeeding.  Be aware of how much alcohol is in your drink. In the [place] one drink equals one 12 oz bottle of beer (355 mL), one 5 oz glass of wine (148 mL), or one 1 oz glass of hard liquor (44 mL). General instructions  Schedule regular health, dental, and eye exams.  Stay current with your vaccines.  Tell your health care provider if: ? You often feel depressed. ? You have ever been abused or do not feel safe at home. Summary  Adopting a healthy lifestyle and  getting preventive care are important in promoting health and wellness.  Follow your health care provider's instructions about healthy diet, exercising, and getting tested or screened for diseases.  Follow your health care provider's instructions on monitoring your cholesterol and blood pressure. This information is not intended to replace advice given to you by your health care provider. Make sure you discuss any questions you have with your health care provider. Document Released: 05/20/2011 Document Revised: 10/28/2018 Document Reviewed: 10/28/2018 Elsevier Patient Education  2020 [employer].

## 2019-07-15 NOTE — Addendum Note (Signed)
Addended by: Kellie Simmering on: 07/15/2019 12:24 PM   Modules accepted: Orders

## 2019-07-15 NOTE — Patient Instructions (Signed)
[name] , Thank you for taking time to come for your Medicare Wellness Visit. I appreciate your ongoing commitment to your health goals. Please review the following plan we discussed and let me know if I can assist you in the future.   Screening recommendations/referrals: Colonoscopy: 05/2017 Mammogram: 04/2019 Bone Density: 05/2017 Recommended yearly ophthalmology/optometry visit for glaucoma screening and checkup Recommended yearly dental visit for hygiene and checkup  Vaccinations: Influenza vaccine: states had severe reaction to latex Pneumococcal vaccine: 05/2013 Tdap vaccine: 06/10/2013 Shingles vaccine: discussed    Advanced directives: Advance directive discussed with you today. Even though you declined this today please call our office should you change your mind and we can give you the proper paperwork for you to fill out.   Conditions/risks identified: HTN  Next appointment: 07/20/2020 at 10:30   Preventive Care 50 Years and Older, Female Preventive care refers to lifestyle choices and visits with your health care provider that can promote health and wellness. What does preventive care include?  A yearly physical exam. This is also called an annual well check.  Dental exams once or twice a year.  Routine eye exams. Ask your health care provider how often you should have your eyes checked.  Personal lifestyle choices, including:  Daily care of your teeth and gums.  Regular physical activity.  Eating a healthy diet.  Avoiding tobacco and drug use.  Limiting alcohol use.  Practicing safe sex.  Taking low-dose aspirin every day.  Taking vitamin and mineral supplements as recommended by your health care provider. What happens during an annual well check? The services and screenings done by your health care provider during your annual well check will depend on your age, overall health, lifestyle risk factors, and family history of disease. Counseling  Your  health care provider may ask you questions about your:  Alcohol use.  Tobacco use.  Drug use.  Emotional well-being.  Home and relationship well-being.  Sexual activity.  Eating habits.  History of falls.  Memory and ability to understand (cognition).  Work and work Statistician.  Reproductive health. Screening  You may have the following tests or measurements:  Height, weight, and BMI.  Blood pressure.  Lipid and cholesterol levels. These may be checked every 5 years, or more frequently if you are over 56 years old.  Skin check.  Lung cancer screening. You may have this screening every year starting at age 70 if you have a 30-pack-year history of smoking and currently smoke or have quit within the past 15 years.  Fecal occult blood test (FOBT) of the stool. You may have this test every year starting at age 22.  Flexible sigmoidoscopy or colonoscopy. You may have a sigmoidoscopy every 5 years or a colonoscopy every 10 years starting at age 48.  Hepatitis C blood test.  Hepatitis B blood test.  Sexually transmitted disease (STD) testing.  Diabetes screening. This is done by checking your blood sugar (glucose) after you have not eaten for a while (fasting). You may have this done every 1-3 years.  Bone density scan. This is done to screen for osteoporosis. You may have this done starting at age 52.  Mammogram. This may be done every 1-2 years. Talk to your health care provider about how often you should have regular mammograms. Talk with your health care provider about your test results, treatment options, and if necessary, the need for more tests. Vaccines  Your health care provider may recommend certain vaccines, such as:  Influenza  vaccine. This is recommended every year.  Tetanus, diphtheria, and acellular pertussis (Tdap, Td) vaccine. You may need a Td booster every 10 years.  Zoster vaccine. You may need this after age 53.  Pneumococcal 13-valent  conjugate (PCV13) vaccine. One dose is recommended after age 8.  Pneumococcal polysaccharide (PPSV23) vaccine. One dose is recommended after age 48. Talk to your health care provider about which screenings and vaccines you need and how often you need them. This information is not intended to replace advice given to you by your health care provider. Make sure you discuss any questions you have with your health care provider. Document Released: 12/01/2015 Document Revised: 07/24/2016 Document Reviewed: 09/05/2015 Elsevier Interactive Patient Education  2017 [place] Prevention in the Home Falls can cause injuries. They can happen to people of all ages. There are many things you can do to make your home safe and to help prevent falls. What can I do on the outside of my home?  Regularly fix the edges of walkways and driveways and fix any cracks.  Remove anything that might make you trip as you walk through a door, such as a raised step or threshold.  Trim any bushes or trees on the path to your home.  Use bright outdoor lighting.  Clear any walking paths of anything that might make someone trip, such as rocks or tools.  Regularly check to see if handrails are loose or broken. Make sure that both sides of any steps have handrails.  Any raised decks and porches should have guardrails on the edges.  Have any leaves, snow, or ice cleared regularly.  Use sand or salt on walking paths during winter.  Clean up any spills in your garage right away. This includes oil or grease spills. What can I do in the bathroom?  Use night lights.  Install grab bars by the toilet and in the tub and shower. Do not use towel bars as grab bars.  Use non-skid mats or decals in the tub or shower.  If you need to sit down in the shower, use a plastic, non-slip stool.  Keep the floor dry. Clean up any water that spills on the floor as soon as it happens.  Remove soap buildup in the tub or  shower regularly.  Attach bath mats securely with double-sided non-slip rug tape.  Do not have throw rugs and other things on the floor that can make you trip. What can I do in the bedroom?  Use night lights.  Make sure that you have a light by your bed that is easy to reach.  Do not use any sheets or blankets that are too big for your bed. They should not hang down onto the floor.  Have a firm chair that has side arms. You can use this for support while you get dressed.  Do not have throw rugs and other things on the floor that can make you trip. What can I do in the kitchen?  Clean up any spills right away.  Avoid walking on wet floors.  Keep items that you use a lot in easy-to-reach places.  If you need to reach something above you, use a strong step stool that has a grab bar.  Keep electrical cords out of the way.  Do not use floor polish or wax that makes floors slippery. If you must use wax, use non-skid floor wax.  Do not have throw rugs and other things on the floor that can  make you trip. What can I do with my stairs?  Do not leave any items on the stairs.  Make sure that there are handrails on both sides of the stairs and use them. Fix handrails that are broken or loose. Make sure that handrails are as long as the stairways.  Check any carpeting to make sure that it is firmly attached to the stairs. Fix any carpet that is loose or worn.  Avoid having throw rugs at the top or bottom of the stairs. If you do have throw rugs, attach them to the floor with carpet tape.  Make sure that you have a light switch at the top of the stairs and the bottom of the stairs. If you do not have them, ask someone to add them for you. What else can I do to help prevent falls?  Wear shoes that:  Do not have high heels.  Have rubber bottoms.  Are comfortable and fit you well.  Are closed at the toe. Do not wear sandals.  If you use a stepladder:  Make sure that it is fully  opened. Do not climb a closed stepladder.  Make sure that both sides of the stepladder are locked into place.  Ask someone to hold it for you, if possible.  Clearly mark and make sure that you can see:  Any grab bars or handrails.  First and last steps.  Where the edge of each step is.  Use tools that help you move around (mobility aids) if they are needed. These include:  Canes.  Walkers.  Scooters.  Crutches.  Turn on the lights when you go into a dark area. Replace any light bulbs as soon as they burn out.  Set up your furniture so you have a clear path. Avoid moving your furniture around.  If any of your floors are uneven, fix them.  If there are any pets around you, be aware of where they are.  Review your medicines with your doctor. Some medicines can make you feel dizzy. This can increase your chance of falling. Ask your doctor what other things that you can do to help prevent falls. This information is not intended to replace advice given to you by your health care provider. Make sure you discuss any questions you have with your health care provider. Document Released: 08/31/2009 Document Revised: 04/11/2016 Document Reviewed: 12/09/2014 Elsevier Interactive Patient Education  2017 [employer].

## 2019-07-15 NOTE — Progress Notes (Signed)
Subjective:   [name] is a 73 y.o. female who presents for Medicare Annual (Subsequent) preventive examination.  Review of Systems:  n/a Cardiac Risk Factors include: advanced age (>69men, >66 women);hypertension;sedentary lifestyle     Objective:     Vitals: BP (!) 142/78 (BP Location: Right Arm, Patient Position: Sitting, Cuff Size: Large)   Pulse 78   Temp 98.1 F (36.7 C) (Oral)   Ht 5\' 8"  (1.727 m) Comment: per patient  SpO2 98%   BMI 31.93 kg/m   Body mass index is 31.93 kg/m.  Advanced Directives 07/15/2019 02/09/2019  Does Patient Have a Medical Advance Directive? No No    Tobacco Social History   Tobacco Use  Smoking Status Former Smoker  Smokeless Tobacco Never Used  Tobacco Comment   quit at age 65,only smoked for 1-2 years     Counseling given: Not Answered Comment: quit at age 65,only smoked for 1-2 years   Clinical Intake:  Pre-visit preparation completed: Yes  Pain : No/denies pain     Nutritional Risks: None Diabetes: No  How often do you need to have someone help you when you read instructions, pamphlets, or other written materials from your doctor or pharmacy?: 1 - Never What is the last grade level you completed in school?: college  Interpreter Needed?: No  Information entered by :: NAllen LPN  Past Medical History:  Diagnosis Date  . Falls   . Hypertension   . Varicose veins of both lower extremities   . Vitamin D deficiency    Past Surgical History:  Procedure Laterality Date  . BREAST BIOPSY Right   . BREAST EXCISIONAL BIOPSY Right   . FOOT FRACTURE SURGERY Right   . TUBAL LIGATION     Family History  Problem Relation Age of Onset  . Hypertension Mother   . Hypertension Father   . Cancer Father   . Diabetes Brother   . Breast cancer Paternal Aunt   . Breast cancer Paternal Grandmother    Social History   Socioeconomic History  . Marital status: Single    Spouse name: Not on file  . Number of  children: Not on file  . Years of education: Not on file  . Highest education level: Not on file  Occupational History  . Not on file  Social Needs  . Financial resource strain: Not hard at all  . Food insecurity    Worry: Never true    Inability: Never true  . Transportation needs    Medical: No    Non-medical: No  Tobacco Use  . Smoking status: Former Research scientist (life sciences)  . Smokeless tobacco: Never Used  . Tobacco comment: quit at age 65,only smoked for 1-2 years  Substance and Sexual Activity  . Alcohol use: Yes    Comment: 2 drinks weekly  . Drug use: No  . Sexual activity: Not Currently  Lifestyle  . Physical activity    Days per week: 0 days    Minutes per session: 0 min  . Stress: Not at all  Relationships  . Social Herbalist on phone: Not on file    Gets together: Not on file    Attends religious service: Not on file    Active member of club or organization: Not on file    Attends meetings of clubs or organizations: Not on file    Relationship status: Not on file  Other Topics Concern  . Not on file  Social History Narrative  .  Not on file    Outpatient Encounter Medications as of 07/15/2019  Medication Sig  . acetaminophen (TYLENOL) 500 MG tablet Take 1,000 mg by mouth daily as needed for moderate pain.  [name] CALCIUM PO Take by mouth.  . Diclofenac Sodium (PENNSAID) 2 % SOLN Place onto the skin.  . Magnesium 100 MG CAPS Take by mouth.  . Multiple Vitamin (MULTIVITAMIN) capsule Take 1 capsule by mouth daily.  [name] VITAMIN D PO Take by mouth.  . Zinc 30 MG CAPS Take by mouth.  . [DISCONTINUED] lisinopril (ZESTRIL) 10 MG tablet Take 1 tablet (10 mg total) by mouth daily.  . [DISCONTINUED] hydrochlorothiazide (HYDRODIURIL) 25 MG tablet Take 1 tablet (25 mg total) by mouth daily. (Patient not taking: Reported on 07/08/2019)   No facility-administered encounter medications on file as of 07/15/2019.     Activities of Daily Living In your present state of health, do you  have any difficulty performing the following activities: 07/15/2019  Hearing? N  Vision? Y  Comment sometimes  Difficulty concentrating or making decisions? N  Walking or climbing stairs? Y  Comment sometimes due to left knee  Dressing or bathing? N  Doing errands, shopping? N  Preparing Food and eating ? N  Using the Toilet? N  In the past six months, have you accidently leaked urine? N  Do you have problems with loss of bowel control? N  Managing your Medications? N  Managing your Finances? N  Housekeeping or managing your Housekeeping? N  Some recent data might be hidden    Patient Care Team: Glendale Chard, MD as PCP - General (Internal Medicine)    Assessment:   This is a routine wellness examination for [name].  Exercise Activities and Dietary recommendations Current Exercise Habits: The patient does not participate in regular exercise at present  Goals    . Patient Stated     07/15/2019, wants to get out of pain    . Weight (lb) < 200 lb (90.7 kg)     07/15/2019, wants to weigh 170 pounds       Fall Risk Fall Risk  07/15/2019 07/08/2019 06/30/2019 06/25/2019 06/18/2019  Falls in the past year? 0 0 0 0 0  Number falls in past yr: - - - - -  Injury with Fall? - - - - -  Risk for fall due to : Medication side effect - - - -  Follow up Falls evaluation completed;Education provided;Falls prevention discussed - - - -   Is the patient's home free of loose throw rugs in walkways, pet beds, electrical cords, etc?   yes      Grab bars in the bathroom? no      Handrails on the stairs?   yes      Adequate lighting?   yes  Timed Get Up and Go performed: n/a  Depression Screen PHQ 2/9 Scores 07/15/2019 07/08/2019 06/30/2019 06/25/2019  PHQ - 2 Score 0 0 0 0  PHQ- 9 Score 1 - - -     Cognitive Function     6CIT Screen 07/15/2019  What Year? 0 points  What month? 0 points  What time? 0 points  Count back from 20 0 points  Months in reverse 0 points  Repeat phrase 0  points  Total Score 0    Immunization History  Administered Date(s) Administered  . DTaP 06/10/2013  . Influenza, High Dose Seasonal PF 09/11/2018  . Pneumococcal Conjugate-13 02/02/2019  . Pneumococcal-Unspecified 06/10/2013    Qualifies  for Shingles Vaccine? yes  Screening Tests Health Maintenance  Topic Date Due  . INFLUENZA VACCINE  06/19/2019  . MAMMOGRAM  05/02/2021  . TETANUS/TDAP  06/11/2023  . COLONOSCOPY  06/06/2027  . DEXA SCAN  Completed  . Hepatitis C Screening  Completed  . PNA vac Low Risk Adult  Completed    Cancer Screenings: Lung: Low Dose CT Chest recommended if Age 63-80 years, 30 pack-year currently smoking OR have quit w/in 15years. Patient does not qualify. Breast:  Up to date on Mammogram? Yes   Up to date of Bone Density/Dexa? Yes Colorectal: up to date  Additional Screenings: : Hepatitis C Screening: 04/2017     Plan:    Patient wants to weigh 170 pounds and stop having pain.   I have personally reviewed and noted the following in the patient's chart:   . Medical and social history . Use of alcohol, tobacco or illicit drugs  . Current medications and supplements . Functional ability and status . Nutritional status . Physical activity . Advanced directives . List of other physicians . Hospitalizations, surgeries, and ER visits in previous 12 months . Vitals . Screenings to include cognitive, depression, and falls . Referrals and appointments  In addition, I have reviewed and discussed with patient certain preventive protocols, quality metrics, and best practice recommendations. A written personalized care plan for preventive services as well as general preventive health recommendations were provided to patient.     Kellie Simmering, LPN  [id number]

## 2019-07-16 LAB — CMP14+EGFR
ALT: 13 IU/L (ref 0–32)
AST: 18 IU/L (ref 0–40)
Albumin/Globulin Ratio: 1.7 (ref 1.2–2.2)
Albumin: 4.2 g/dL (ref 3.7–4.7)
Alkaline Phosphatase: 72 IU/L (ref 39–117)
BUN/Creatinine Ratio: 14 (ref 12–28)
BUN: 16 mg/dL (ref 8–27)
Bilirubin Total: <0.2 mg/dL (ref 0.0–1.2)
CO2: 24 mmol/L (ref 20–29)
Calcium: 9.2 mg/dL (ref 8.7–10.3)
Chloride: 103 mmol/L (ref 96–106)
Creatinine, Ser: 1.15 mg/dL — ABNORMAL HIGH (ref 0.57–1.00)
GFR calc Af Amer: 55 mL/min/{1.73_m2} — ABNORMAL LOW (ref >59)
GFR calc non Af Amer: 48 mL/min/{1.73_m2} — ABNORMAL LOW (ref >59)
Globulin, Total: 2.5 g/dL (ref 1.5–4.5)
Glucose: 88 mg/dL (ref 65–99)
Potassium: 4.4 mmol/L (ref 3.5–5.2)
Sodium: 141 mmol/L (ref 134–144)
Total Protein: 6.7 g/dL (ref 6.0–8.5)

## 2019-07-16 LAB — CBC
Hematocrit: 35.1 % (ref 34.0–46.6)
Hemoglobin: 11.6 g/dL (ref 11.1–15.9)
MCH: 30.1 pg (ref 26.6–33.0)
MCHC: 33 g/dL (ref 31.5–35.7)
MCV: 91 fL (ref 79–97)
Platelets: 296 10*3/uL (ref 150–450)
RBC: 3.85 x10E6/uL (ref 3.77–5.28)
RDW: 13.1 % (ref 11.7–15.4)
WBC: 10.1 10*3/uL (ref 3.4–10.8)

## 2019-07-16 LAB — LIPID PANEL
Chol/HDL Ratio: 3 ratio (ref 0.0–4.4)
Cholesterol, Total: 200 mg/dL — ABNORMAL HIGH (ref 100–199)
HDL: 67 mg/dL (ref >39)
LDL Calculated: 118 mg/dL — ABNORMAL HIGH (ref 0–99)
Triglycerides: 73 mg/dL (ref 0–149)
VLDL Cholesterol Cal: 15 mg/dL (ref 5–40)

## 2019-07-16 LAB — HEMOGLOBIN A1C
Est. average glucose Bld gHb Est-mCnc: 117 mg/dL
Hgb A1c MFr Bld: 5.7 % — ABNORMAL HIGH (ref 4.8–5.6)

## 2019-07-16 LAB — VITAMIN D 25 HYDROXY (VIT D DEFICIENCY, FRACTURES): Vit D, 25-Hydroxy: 36.3 ng/mL (ref 30.0–100.0)

## 2019-07-16 LAB — INSULIN, RANDOM: INSULIN: 15.3 u[IU]/mL (ref 2.6–24.9)

## 2019-07-21 DIAGNOSIS — R69 Illness, unspecified: Secondary | ICD-10-CM | POA: Diagnosis not present

## 2019-07-28 DIAGNOSIS — M19072 Primary osteoarthritis, left ankle and foot: Secondary | ICD-10-CM | POA: Diagnosis not present

## 2019-07-28 DIAGNOSIS — M19071 Primary osteoarthritis, right ankle and foot: Secondary | ICD-10-CM | POA: Diagnosis not present

## 2019-07-29 ENCOUNTER — Encounter: Payer: Self-pay | Admitting: Internal Medicine

## 2019-08-01 NOTE — Progress Notes (Signed)
Subjective:     Patient ID: [name] , female    DOB: 03/17/1946 , 73 y.o.   MRN: [id number]   Chief Complaint  Patient presents with  . Annual Exam  . Hypertension    HPI  She is here today for a full physical exam. She is followed by GYN for her pelvic exams.   Hypertension This is a chronic problem. The current episode started more than 1 year ago. The problem has been gradually improving since onset. The problem is uncontrolled. Pertinent negatives include no blurred vision, chest pain, palpitations or shortness of breath. The current treatment provides moderate improvement.   She admits that she does not exercise regularly due to joint pains.   Past Medical History:  Diagnosis Date  . Falls   . Hypertension   . Varicose veins of both lower extremities   . Vitamin D deficiency      Family History  Problem Relation Age of Onset  . Hypertension Mother   . Hypertension Father   . Cancer Father   . Diabetes Brother   . Breast cancer Paternal Aunt   . Breast cancer Paternal Grandmother      Current Outpatient Medications:  .  acetaminophen (TYLENOL) 500 MG tablet, Take 1,000 mg by mouth daily as needed for moderate pain., Disp: , Rfl:  .  CALCIUM PO, Take by mouth., Disp: , Rfl:  .  Diclofenac Sodium (PENNSAID) 2 % SOLN, Place onto the skin., Disp: , Rfl:  .  hydrochlorothiazide (HYDRODIURIL) 25 MG tablet, Take 1 tablet (25 mg total) by mouth daily., Disp: 90 tablet, Rfl: 1 .  lisinopril (ZESTRIL) 10 MG tablet, Take 1 tablet (10 mg total) by mouth daily., Disp: 90 tablet, Rfl: 1 .  Magnesium 100 MG CAPS, Take by mouth., Disp: , Rfl:  .  Multiple Vitamin (MULTIVITAMIN) capsule, Take 1 capsule by mouth daily., Disp: , Rfl:  .  VITAMIN D PO, Take by mouth., Disp: , Rfl:  .  Zinc 30 MG CAPS, Take by mouth., Disp: , Rfl:    No Known Allergies   Review of Systems  Constitutional: Negative.   HENT: Negative.   Eyes: Negative.  Negative for blurred vision.   Respiratory: Negative.  Negative for shortness of breath.   Cardiovascular: Negative.  Negative for chest pain and palpitations.  Endocrine: Negative.   Genitourinary: Negative.   Musculoskeletal: Positive for arthralgias.  Skin: Negative.   Allergic/Immunologic: Negative.   Neurological: Negative.   Hematological: Negative.   Psychiatric/Behavioral: Negative.      Today's Vitals   07/15/19 1117  BP: (!) 142/78  Pulse: 78  Temp: 98.1 F (36.7 C)  TempSrc: Oral  Height: _0  (1.727 m)   Body mass index is 31.93 kg/m.   Objective:  Physical Exam Vitals signs and nursing note reviewed.  Constitutional:      Appearance: Normal appearance. She is obese.  HENT:     Head: Normocephalic and atraumatic.     Right Ear: Tympanic membrane, ear canal and external ear normal.     Left Ear: Tympanic membrane, ear canal and external ear normal.     Nose: Nose normal.     Mouth/Throat:     Mouth: Mucous membranes are moist.     Pharynx: Oropharynx is clear.  Eyes:     Extraocular Movements: Extraocular movements intact.     Conjunctiva/sclera: Conjunctivae normal.     Pupils: Pupils are equal, round, and reactive to light.  Neck:  Musculoskeletal: Normal range of motion and neck supple.  Cardiovascular:     Rate and Rhythm: Normal rate and regular rhythm.     Pulses: Normal pulses.     Heart sounds: Normal heart sounds.  Pulmonary:     Effort: Pulmonary effort is normal.     Breath sounds: Normal breath sounds.  Chest:     Breasts: Tanner Score is 5.        Right: Normal. No swelling, bleeding, inverted nipple, mass, nipple discharge or skin change.        Left: No swelling, bleeding, inverted nipple, mass, nipple discharge or skin change.  Abdominal:     General: Bowel sounds are normal.     Palpations: Abdomen is soft.     Comments: obese  Genitourinary:    Comments: deferred Musculoskeletal: Normal range of motion.     Comments: r ankle swelling - this is chronic   Skin:    General: Skin is warm and dry.  Neurological:     General: No focal deficit present.     Mental Status: She is alert and oriented to person, place, and time.  Psychiatric:        Mood and Affect: Mood normal.        Behavior: Behavior normal.         Assessment And Plan:     1. Routine general medical examination at health care facility  A full exam was performed.  Importance of monthly self breast exams was discussed with the patient.  PATIENT HAS BEEN ADVISED TO GET 30-45 MINUTES REGULAR EXERCISE NO LESS THAN FOUR TO FIVE DAYS PER WEEK - BOTH WEIGHTBEARING EXERCISES AND AEROBIC ARE RECOMMENDED.  SHE WAS ADVISED TO FOLLOW A HEALTHY DIET WITH AT LEAST SIX FRUITS/VEGGIES PER DAY, DECREASE INTAKE OF RED MEAT, AND TO INCREASE FISH INTAKE TO TWO DAYS PER WEEK.  MEATS/FISH SHOULD NOT BE FRIED, BAKED OR BROILED IS PREFERABLE.  I SUGGEST WEARING SPF 50 SUNSCREEN ON EXPOSED PARTS AND ESPECIALLY WHEN IN THE DIRECT SUNLIGHT FOR AN EXTENDED PERIOD OF TIME.  PLEASE AVOID FAST FOOD RESTAURANTS AND INCREASE YOUR WATER INTAKE. '  2. Essential hypertension, benign  Chronic, fair control. She will continue with current meds. Again, she was encouraged to incorporate more exercise into her daily routine. Advised to consider water aerobics since she has joint pains.  EKG performed, no new changes noted.  She will rto in six months for re-evaluation.   - CMP14+EGFR - CBC - Lipid panel - EKG 12-Lead  3. Primary osteoarthritis of left knee  Chronic. I will refer her to Pain Clinic for further evaluation. She does not wish to have surgery at this time.   - Ambulatory referral to Pain Clinic  4. Low vitamin B12 level  She was given Vitamin B12 IM x 1. She will rto in four weeks for her next injection.   - cyanocobalamin ((VITAMIN B-12)) injection 1,000 mcg  5. Weight gain  She declines to get on our scale b/c she states this will discourage her. She is aware she has gained weight because  of how her clothes fit. I will refer her to [facility] for further instruction on weight loss.   - Hemoglobin A1c - Insulin, random(561)  6. Vitamin D deficiency disease  I WILL CHECK A VIT D LEVEL AND SUPPLEMENT AS NEEDED.  ALSO ENCOURAGED TO SPEND 15 MINUTES IN THE SUN DAILY.  - Vitamin D (25 hydroxy)        Temisha Murley  N Monigue Spraggins, MD    THE PATIENT IS ENCOURAGED TO PRACTICE SOCIAL DISTANCING DUE TO THE COVID-19 PANDEMIC.   

## 2019-08-12 ENCOUNTER — Ambulatory Visit: Payer: Medicare HMO | Admitting: Internal Medicine

## 2019-08-12 ENCOUNTER — Ambulatory Visit (INDEPENDENT_AMBULATORY_CARE_PROVIDER_SITE_OTHER): Payer: Medicare HMO

## 2019-08-12 ENCOUNTER — Other Ambulatory Visit: Payer: Self-pay

## 2019-08-12 VITALS — BP 130/76 | HR 71 | Temp 98.1°F

## 2019-08-12 DIAGNOSIS — E538 Deficiency of other specified B group vitamins: Secondary | ICD-10-CM | POA: Diagnosis not present

## 2019-08-12 MED ORDER — CYANOCOBALAMIN 1000 MCG/ML IJ SOLN
1000.0000 ug | Freq: Once | INTRAMUSCULAR | Status: AC
  Administered 2019-08-12: 1000 ug via INTRAMUSCULAR

## 2019-08-12 NOTE — Progress Notes (Signed)
Patient presented today for vitamin B12 injection.

## 2019-08-18 ENCOUNTER — Other Ambulatory Visit: Payer: Self-pay

## 2019-08-18 DIAGNOSIS — I10 Essential (primary) hypertension: Secondary | ICD-10-CM

## 2019-08-19 ENCOUNTER — Telehealth: Payer: Self-pay | Admitting: Cardiovascular Disease

## 2019-08-19 NOTE — Telephone Encounter (Signed)
LVM for patient to call and schedule a new patient appt for Dr. Oval Linsey.

## 2019-08-24 ENCOUNTER — Other Ambulatory Visit: Payer: Self-pay

## 2019-08-24 DIAGNOSIS — Z20822 Contact with and (suspected) exposure to covid-19: Secondary | ICD-10-CM

## 2019-08-24 DIAGNOSIS — Z20828 Contact with and (suspected) exposure to other viral communicable diseases: Secondary | ICD-10-CM | POA: Diagnosis not present

## 2019-08-26 LAB — NOVEL CORONAVIRUS, NAA: SARS-CoV-2, NAA: NOT DETECTED

## 2019-09-07 ENCOUNTER — Encounter: Payer: Self-pay | Admitting: Internal Medicine

## 2019-09-07 ENCOUNTER — Ambulatory Visit (INDEPENDENT_AMBULATORY_CARE_PROVIDER_SITE_OTHER): Payer: Medicare HMO | Admitting: Internal Medicine

## 2019-09-07 ENCOUNTER — Other Ambulatory Visit: Payer: Self-pay

## 2019-09-07 VITALS — BP 112/74 | HR 89 | Temp 98.1°F | Ht 69.0 in

## 2019-09-07 DIAGNOSIS — G8929 Other chronic pain: Secondary | ICD-10-CM

## 2019-09-07 DIAGNOSIS — E538 Deficiency of other specified B group vitamins: Secondary | ICD-10-CM

## 2019-09-07 DIAGNOSIS — M5431 Sciatica, right side: Secondary | ICD-10-CM

## 2019-09-07 DIAGNOSIS — I10 Essential (primary) hypertension: Secondary | ICD-10-CM | POA: Diagnosis not present

## 2019-09-07 DIAGNOSIS — R7989 Other specified abnormal findings of blood chemistry: Secondary | ICD-10-CM

## 2019-09-07 MED ORDER — CYANOCOBALAMIN 1000 MCG/ML IJ SOLN
1000.0000 ug | Freq: Once | INTRAMUSCULAR | Status: AC
  Administered 2019-09-07: 11:00:00 1000 ug via INTRAMUSCULAR

## 2019-09-07 NOTE — Patient Instructions (Signed)
   Sciatica  Sciatica is pain, weakness, tingling, or loss of feeling (numbness) along the sciatic nerve. The sciatic nerve starts in the lower back and goes down the back of each leg. Sciatica usually goes away on its own or with treatment. Sometimes, sciatica may come back (recur). What are the causes? This condition happens when the sciatic nerve is pinched or has pressure put on it. This may be the result of:  A disk in between the bones of the spine bulging out too far (herniated disk).  Changes in the spinal disks that occur with aging.  A condition that affects a muscle in the butt.  Extra bone growth near the sciatic nerve.  A break (fracture) of the area between your hip bones (pelvis).  Pregnancy.  Tumor. This is rare. What increases the risk? You are more likely to develop this condition if you:  Play sports that put pressure or stress on the spine.  Have poor strength and ease of movement (flexibility).  Have had a back injury in the past.  Have had back surgery.  Sit for long periods of time.  Do activities that involve bending or lifting over and over again.  Are very overweight (obese). What are the signs or symptoms? Symptoms can vary from mild to very bad. They may include:  Any of these problems in the lower back, leg, hip, or butt: ? Mild tingling, loss of feeling, or dull aches. ? Burning sensations. ? Sharp pains.  Loss of feeling in the back of the calf or the sole of the foot.  Leg weakness.  Very bad back pain that makes it hard to move. These symptoms may get worse when you cough, sneeze, or laugh. They may also get worse when you sit or stand for long periods of time. How is this treated? This condition often gets better without any treatment. However, treatment may include:  Changing or cutting back on physical activity when you have pain.  Doing exercises and stretching.  Putting ice or heat on the affected area.  Medicines  that help: ? To relieve pain and swelling. ? To relax your muscles.  Shots (injections) of medicines that help to relieve pain, irritation, and swelling.  Surgery. Follow these instructions at home: Medicines  Take over-the-counter and prescription medicines only as told by your doctor.  Ask your doctor if the medicine prescribed to you: ? Requires you to avoid driving or using heavy machinery. ? Can cause trouble pooping (constipation). You may need to take these steps to prevent or treat trouble pooping:  Drink enough fluids to keep your pee (urine) pale yellow.  Take over-the-counter or prescription medicines.  Eat foods that are high in fiber. These include beans, whole grains, and fresh fruits and vegetables.  Limit foods that are high in fat and sugar. These include fried or sweet foods. Managing pain      If told, put ice on the affected area. ? Put ice in a plastic bag. ? Place a towel between your skin and the bag. ? Leave the ice on for 20 minutes, 2-3 times a day.  If told, put heat on the affected area. Use the heat source that your doctor tells you to use, such as a moist heat pack or a heating pad. ? Place a towel between your skin and the heat source. ? Leave the heat on for 20-30 minutes. ? Remove the heat if your skin turns bright red. This is very important   unable to feel pain, heat, or cold. You may have a greater risk of getting burned. Activity   Return to your normal activities as told by your doctor. Ask your doctor what activities are safe for you.  Avoid activities that make your symptoms worse.  Take short rests during the day. ? When you rest for a long time, do some physical activity or stretching between periods of rest. ? Avoid sitting for a long time without moving. Get up and move around at least one time each hour.  Exercise and stretch regularly, as told by your doctor.  Do not lift anything that is heavier than 10 lb (4.5 kg) while  you have symptoms of sciatica. ? Avoid lifting heavy things even when you do not have symptoms. ? Avoid lifting heavy things over and over.  When you lift objects, always lift in a way that is safe for your body. To do this, you should: ? Bend your knees. ? Keep the object close to your body. ? Avoid twisting. General instructions  Stay at a healthy weight.  Wear comfortable shoes that support your feet. Avoid wearing high heels.  Avoid sleeping on a mattress that is too soft or too hard. You might have less pain if you sleep on a mattress that is firm enough to support your back.  Keep all follow-up visits as told by your doctor. This is important. Contact a doctor if:  You have pain that: ? Wakes you up when you are sleeping. ? Gets worse when you lie down. ? Is worse than the pain you have had in the past. ? Lasts longer than 4 weeks.  You lose weight without trying. Get help right away if:  You cannot control when you pee (urinate) or poop (have a bowel movement).  You have weakness in any of these areas and it gets worse: ? Lower back. ? The area between your hip bones. ? Butt. ? Legs.  You have redness or swelling of your back.  You have a burning feeling when you pee. Summary  Sciatica is pain, weakness, tingling, or loss of feeling (numbness) along the sciatic nerve.  This condition happens when the sciatic nerve is pinched or has pressure put on it.  Sciatica can cause pain, tingling, or loss of feeling (numbness) in the lower back, legs, hips, and butt.  Treatment often includes rest, exercise, medicines, and putting ice or heat on the affected area. This information is not intended to replace advice given to you by your health care provider. Make sure you discuss any questions you have with your health care provider. Document Released: 08/13/2008 Document Revised: 11/23/2018 Document Reviewed: 11/23/2018 Elsevier Patient Education  2020 [place].    Piriformis Syndrome  Piriformis syndrome is a condition that can cause pain and numbness in your buttocks and down the back of your leg. Piriformis syndrome happens when the small muscle that connects the base of your spine to your hip (piriformis muscle) presses on the nerve that runs down the back of your leg (sciatic nerve). The piriformis muscle helps your hip rotate and helps to bring your leg back and out. It also helps shift your weight to keep you stable while you are walking. The sciatic nerve runs under or through the piriformis muscle. Damage to the piriformis muscle can cause spasms that put pressure on the nerve below. This causes pain and discomfort while sitting and moving. The pain may feel as if it begins in the  buttock and spreads (radiates) down your hip and thigh. What are the causes? This condition is caused by pressure on the sciatic nerve from the piriformis muscle. The piriformis muscle can get irritated with overuse, especially if other hip muscles are weak and the piriformis muscle has to do extra work. Piriformis syndrome can also occur after an injury, like a fall onto your buttocks. What increases the risk? You are more likely to develop this condition if you:  Are a woman.  Sit for long periods of time.  Are a cyclist.  Have weak buttocks muscles (gluteal muscles). What are the signs or symptoms? Symptoms of this condition include:  Pain, tingling, or numbness that starts in the buttock and runs down the back of your leg (sciatica).  Pain in the groin or thigh area. Your symptoms may get worse:  The longer you sit.  When you walk, run, or climb stairs.  When straining to have a bowel movement. How is this diagnosed? This condition is diagnosed based on your symptoms, medical history, and physical exam.  During the exam, your health care provider may: ? Move your leg into different positions to check for pain. ? Press on the muscles of your hip and  buttock to see if that increases your symptoms.  You may also have tests, including: ? Imaging tests such as X-rays, MRI, or ultrasound. ? Electromyogram (EMG). This test measures electrical signals sent by your nerves into the muscles. ? Nerve conduction study. This test measures how well electrical signals pass through your nerves. How is this treated? This condition may be treated by:  Stopping all activities that cause pain or make your condition worse.  Applying ice or using heat therapy.  Taking medicines to reduce pain and swelling.  Taking a muscle relaxer (muscle relaxant) to stop muscle spasms.  Doing range-of-motion and strengthening exercises (physical therapy) as told by your health care provider.  Massaging the area.  Having acupuncture.  Getting an injection of medicine in the piriformis muscle. Your health care provider will choose the medicine based on your condition. He or she may inject: ? An anti-inflammatory medicine (steroid) to reduce swelling. ? A numbing medicine (local anesthetic) to block the pain. ? Botulinum toxin. The toxin blocks nerve impulses to specific muscles to reduce muscle tension. In rare cases, you may need surgery to cut the muscle and release pressure on the nerve if other treatments do not work. Follow these instructions at home: Activity  Do not sit for long periods. Get up and walk around every 20 minutes or as often as told by your health care provider. ? When driving long distances, make sure to take frequent stops to get up and stretch.  Use a cushion when you sit on hard surfaces.  Do exercises as told by your health care provider.  Return to your normal activities as told by your health care provider. Ask your health care provider what activities are safe for you. Managing pain, stiffness, and swelling      If directed, apply heat to the affected area as often as told by your health care provider. Use the heat source that  your health care provider recommends, such as a moist heat pack or a heating pad. ? Place a towel between your skin and the heat source. ? Leave the heat on for 20-30 minutes. ? Remove the heat if your skin turns bright red. This is especially important if you are unable to feel pain, heat, or  cold. You may have a greater risk of getting burned.  If directed, put ice on the injured area. ? Put ice in a plastic bag. ? Place a towel between your skin and the bag. ? Leave the ice on for 20 minutes, 2-3 times a day. General instructions  Take over-the-counter and prescription medicines only as told by your health care provider.  Ask your health care provider if the medicine prescribed to you requires you to avoid driving or using heavy machinery.  You may need to take actions to prevent or treat constipation, such as: ? Drink enough fluid to keep your urine pale yellow. ? Take over-the-counter or prescription medicines. ? Eat foods that are high in fiber, such as beans, whole grains, and fresh fruits and vegetables. ? Limit foods that are high in fat and processed sugars, such as fried or sweet foods.  Keep all follow-up visits as told by your health care provider. This is important. How is this prevented?  Do not sit for longer than 20 minutes at a time. When you sit, choose padded surfaces.  Warm up and stretch before being active.  Cool down and stretch after being active.  Give your body time to rest between periods of activity.  Make sure to use equipment that fits you.  Maintain physical fitness, including: ? Strength. ? Flexibility. Contact a health care provider if:  Your pain and stiffness continue or get worse.  Your leg or hip becomes weak.  You have changes in your bowel function or bladder function. Summary  Piriformis syndrome is a condition that can cause pain, tingling, and numbness in your buttocks and down the back of your leg.  You may try applying heat  or ice to relieve the pain.  Do not sit for long periods. Get up and walk around every 20 minutes or as often as told by your health care provider. This information is not intended to replace advice given to you by your health care provider. Make sure you discuss any questions you have with your health care provider. Document Released: 11/04/2005 Document Revised: 02/25/2019 Document Reviewed: 07/01/2018 Elsevier Patient Education  2020 [employer].

## 2019-09-08 LAB — VITAMIN B12: Vitamin B-12: 1426 pg/mL — ABNORMAL HIGH (ref 232–1245)

## 2019-09-09 ENCOUNTER — Telehealth: Payer: Self-pay

## 2019-09-09 NOTE — Telephone Encounter (Signed)
-----   Message from Glendale Chard, MD sent at 09/09/2019  9:19 AM EDT ----- Here are your lab results:  Your vitamin B12 level is elevated. I think you can stop the B12 injections for now. Continue with your oral B-supplementation.   Happy holidays to you and your family! Take care! I will have someone cancel your upcoming B12 appt.   Sincerely,    Robyn N. Baird Cancer, MD

## 2019-09-09 NOTE — Telephone Encounter (Signed)
Left the patient a message to call back for lab results. 

## 2019-09-10 NOTE — Progress Notes (Signed)
Subjective:     Patient ID: [name] , female    DOB: 1946-01-15 , 73 y.o.   MRN: [id number]   Chief Complaint  Patient presents with  . B12 Injection    HPI  She is here today for her monthly vitamin B12 injection. She reports having improved memory and improved energy since starting this regimen. She is now supplementing with oral vitamin B12 as well. She reports her cousin has given her a recipe to help build her immune system during [place]. It contains a lot of ingredients including ginger, fresh berries, and beets. She can't recall full recipe at this time. She reports this has helped her to feel better as well.     Past Medical History:  Diagnosis Date  . Falls   . Hypertension   . Varicose veins of both lower extremities   . Vitamin D deficiency      Family History  Problem Relation Age of Onset  . Hypertension Mother   . Hypertension Father   . Cancer Father   . Diabetes Brother   . Breast cancer Paternal Aunt   . Breast cancer Paternal Grandmother      Current Outpatient Medications:  .  acetaminophen (TYLENOL) 500 MG tablet, Take 1,000 mg by mouth daily as needed for moderate pain., Disp: , Rfl:  .  Ascorbic Acid (VITAMIN C) 1000 MG tablet, Take 1,000 mg by mouth daily., Disp: , Rfl:  .  ASTRAGALUS PO, Take by mouth., Disp: , Rfl:  .  CALCIUM PO, Take by mouth., Disp: , Rfl:  .  COLLAGEN PO, Take by mouth., Disp: , Rfl:  .  Cyanocobalamin (VITAMIN B 12 PO), Take by mouth., Disp: , Rfl:  .  Diclofenac Sodium (PENNSAID) 2 % SOLN, Place onto the skin., Disp: , Rfl:  .  ECHINACEA EXTRACT PO, Take by mouth., Disp: , Rfl:  .  hydrochlorothiazide (HYDRODIURIL) 25 MG tablet, Take 1 tablet (25 mg total) by mouth daily., Disp: 90 tablet, Rfl: 1 .  lisinopril (ZESTRIL) 10 MG tablet, Take 1 tablet (10 mg total) by mouth daily., Disp: 90 tablet, Rfl: 1 .  Magnesium 100 MG CAPS, Take by mouth., Disp: , Rfl:  .  Multiple Vitamin (MULTIVITAMIN) capsule, Take 1 capsule  by mouth daily., Disp: , Rfl:  .  Pyridoxine HCl (VITAMIN B6 PO), Take by mouth., Disp: , Rfl:  .  VITAMIN D PO, Take by mouth., Disp: , Rfl:  .  Zinc 30 MG CAPS, Take by mouth., Disp: , Rfl:    No Known Allergies   Review of Systems  Constitutional: Negative.   Respiratory: Negative.   Cardiovascular: Negative.   Gastrointestinal: Negative.   Musculoskeletal: Positive for back pain.       She reports having longstanding sciatica. She has had recent flare, denies falls/trauma.   Neurological: Negative.   Psychiatric/Behavioral: Negative.      Today's Vitals   09/07/19 0936  BP: 112/74  Pulse: 89  Temp: 98.1 F (36.7 C)  TempSrc: Oral  Height: 5\' 9"  (1.753 m)  PainSc: 0-No pain   Body mass index is 31.01 kg/m.   Objective:  Physical Exam Vitals signs and nursing note reviewed.  Constitutional:      Appearance: Normal appearance.  HENT:     Head: Normocephalic and atraumatic.  Cardiovascular:     Rate and Rhythm: Normal rate and regular rhythm.     Heart sounds: Normal heart sounds.  Pulmonary:     Effort: Pulmonary effort  is normal.     Breath sounds: Normal breath sounds.  Skin:    General: Skin is warm.  Neurological:     General: No focal deficit present.     Mental Status: She is alert.  Psychiatric:        Mood and Affect: Mood normal.        Behavior: Behavior normal.         Assessment And Plan:     1. Low vitamin B12 level  I will check vitamin B12 level today. She was also given vit B12 IM x 1. Patient advised I will schedule her to come in four weeks for her next injection. However, if bloodwork shows levels are adequate, she may not need to come for another 3 months.   - cyanocobalamin ((VITAMIN B-12)) injection 1,000 mcg - Vitamin B12  2. Essential hypertension, benign  Chronic, well controlled today. It has been labile in the recent weeks, has upcoming appt with Cardiology.   3. Sciatica of right side  Chronic. She was given some  exercises to perform and advised to look up piriformis syndrome. She was also advised to consider chiropractic evaluation. She was given information for [facility] to contact at her leisure.   Maximino Greenland, MD    THE PATIENT IS ENCOURAGED TO PRACTICE SOCIAL DISTANCING DUE TO THE COVID-19 PANDEMIC.

## 2019-09-20 ENCOUNTER — Other Ambulatory Visit: Payer: Self-pay | Admitting: Physical Medicine and Rehabilitation

## 2019-09-20 ENCOUNTER — Ambulatory Visit
Admission: RE | Admit: 2019-09-20 | Discharge: 2019-09-20 | Disposition: A | Discharge status: Home / Self Care | Payer: Medicare HMO | Source: Ambulatory Visit | Attending: Physical Medicine and Rehabilitation | Admitting: Physical Medicine and Rehabilitation

## 2019-09-20 ENCOUNTER — Other Ambulatory Visit: Payer: Self-pay

## 2019-09-20 DIAGNOSIS — W19XXXA Unspecified fall, initial encounter: Secondary | ICD-10-CM

## 2019-09-20 DIAGNOSIS — M542 Cervicalgia: Secondary | ICD-10-CM | POA: Diagnosis not present

## 2019-09-23 ENCOUNTER — Other Ambulatory Visit: Payer: Self-pay

## 2019-09-23 DIAGNOSIS — R202 Paresthesia of skin: Secondary | ICD-10-CM

## 2019-10-06 ENCOUNTER — Encounter: Payer: Self-pay | Admitting: Cardiovascular Disease

## 2019-10-06 ENCOUNTER — Other Ambulatory Visit: Payer: Self-pay

## 2019-10-06 ENCOUNTER — Ambulatory Visit: Payer: Medicare HMO | Admitting: Cardiovascular Disease

## 2019-10-06 VITALS — BP 150/72 | HR 77 | Temp 96.4°F | Ht 68.0 in | Wt 220.0 lb

## 2019-10-06 DIAGNOSIS — I10 Essential (primary) hypertension: Secondary | ICD-10-CM | POA: Diagnosis not present

## 2019-10-06 DIAGNOSIS — E7800 Pure hypercholesterolemia, unspecified: Secondary | ICD-10-CM | POA: Diagnosis not present

## 2019-10-06 NOTE — Progress Notes (Signed)
Cardiology Office Note   Date:  10/06/2019   ID:  [name], Jaynes 03/12/1946, MRN [id number]  PCP:  Glendale Chard, MD  Cardiologist:   Skeet Latch, MD   No chief complaint on file.     History of Present Illness: [name] is a 73 y.o. female with obesity and hypertension who presents for management of hypertension.  She saw Dr. Baird Cancer 06/2019 and her BP was poorly controlled.  She was counseled on diet and exercise and it was much better when she returned in 2 months.` She was first started on antihypertensives two years ago.  She was initially started on lisinopril and her BP got too low so she stopped taking it.  Since then it has spiked and she was seen at [facility].  HCTZ was added to her regimen.  However she developed electrolyte abnormalities and this was discontinued at the end of July. As her blood pressure has been in the low 90s over 140s.  The lowest she has seen is in the 100s.  She notes that it seems to be somewhat sporadic.  She thinks it has been mostly controlled since hydrochlorothiazide was stopped and lisinopril was increased to 10 mg.  However, she has not been checking it much lately.  She notices that she gets dizzy when it is either too high or too low.  She has not experienced any chest pain.  She does have some exertional dyspnea but she attributes this to weight gain.  She hasn't been walking as much lately and notices that when she does walk she gets more short of breath.  This has been worse in the last 8-9 months.  It seems to occur when walking up steps.  She notes that her breathing is also worse due to pain. She fell two years ago and has chroinic pain since that time.  She sometimes has worsening lower extremity swelling since the fall.  She has mild orthopnea that she attributes to allergies and congestion.  She denies PND.  She is basically vegetarian.  Past Medical History:  Diagnosis Date  . Falls   . Hypertension   .  Varicose veins of both lower extremities   . Vitamin D deficiency     Past Surgical History:  Procedure Laterality Date  . BREAST BIOPSY Right   . BREAST EXCISIONAL BIOPSY Right   . FOOT FRACTURE SURGERY Right   . TUBAL LIGATION       Current Outpatient Medications  Medication Sig Dispense Refill  . acetaminophen (TYLENOL) 500 MG tablet Take 1,000 mg by mouth daily as needed for moderate pain.    . Ascorbic Acid (VITAMIN C) 1000 MG tablet Take 1,000 mg by mouth daily.    . ASTRAGALUS PO Take by mouth.    [name] CALCIUM PO Take by mouth.    . COLLAGEN PO Take by mouth.    . Cyanocobalamin (VITAMIN B 12 PO) Take by mouth.    . Diclofenac Sodium (PENNSAID) 2 % SOLN Place onto the skin.    [name] ECHINACEA EXTRACT PO Take by mouth.    [name] lisinopril (ZESTRIL) 10 MG tablet Take 1 tablet (10 mg total) by mouth daily. 90 tablet 1  . Magnesium 100 MG CAPS Take by mouth.    . Multiple Vitamin (MULTIVITAMIN) capsule Take 1 capsule by mouth daily.    . Pyridoxine HCl (VITAMIN B6 PO) Take by mouth.    [name] VITAMIN D PO Take by mouth.    [name]  Zinc 30 MG CAPS Take by mouth.     No current facility-administered medications for this visit.     Allergies:   Patient has no known allergies.    Social History:  The patient  reports that she has quit smoking. She has never used smokeless tobacco. She reports current alcohol use. She reports that she does not use drugs.   Family History:  The patient's family history includes Breast cancer in her paternal aunt and paternal grandmother; Diabetes in her brother; Hypertension in her father and mother; Hyperthyroidism in her mother; Prostate cancer in her father.    ROS:  Please see the history of present illness.   Otherwise, review of systems are positive for none.   All other systems are reviewed and negative.    PHYSICAL EXAM: VS:  BP (!) 150/72   Pulse 77   Temp (!) 96.4 F (35.8 C)   Ht 5\' 8"  (1.727 m)   Wt 220 lb (99.8 kg) Comment: Pt stated weight   SpO2 99%   BMI 33.45 kg/m  , BMI Body mass index is 33.45 kg/m. GENERAL:  Well appearing HEENT:  Pupils equal round and reactive, fundi not visualized, oral mucosa unremarkable NECK:  No jugular venous distention, waveform within normal limits, carotid upstroke brisk and symmetric, no bruits, no thyromegaly LUNGS:  Clear to auscultation bilaterally HEART:  RRR.  PMI not displaced or sustained,S1 and S2 within normal limits, no S3, no S4, no clicks, no rubs, no murmurs ABD:  Flat, positive bowel sounds normal in frequency in pitch, no bruits, no rebound, no guarding, no midline pulsatile mass, no hepatomegaly, no splenomegaly EXT:  2 plus pulses throughout, no edema, no cyanosis no clubbing SKIN:  No rashes no nodules NEURO:  Cranial nerves II through XII grossly intact, motor grossly intact throughout PSYCH:  Cognitively intact, oriented to person place and time   EKG:  EKG is not ordered today. The ekg ordered 07/15/19 demonstrates sinus bradycardia.  LAFB.  Rate 59 bpm.   Recent Labs: 06/16/2019: TSH 2.110 07/15/2019: ALT 13; BUN 16; Creatinine, Ser 1.15; Hemoglobin 11.6; Platelets 296; Potassium 4.4; Sodium 141     Lipid Panel    Component Value Date/Time   CHOL 200 (H) 07/15/2019 1130   TRIG 73 07/15/2019 1130   HDL 67 07/15/2019 1130   CHOLHDL 3.0 07/15/2019 1130   LDLCALC 118 (H) 07/15/2019 1130      Wt Readings from Last 3 Encounters:  10/06/19 220 lb (99.8 kg)  09/11/18 210 lb (95.3 kg)  03/30/18 238 lb (108 kg)      ASSESSMENT AND PLAN:  # Essential hypertension:  BP was above goal initially and higher on repeat. She thinks she is stressed about being in the office.  She is going to track her BP twice daily.  Continue lisinopril.   # Pure hypercholesterolemia:  ASCVD 10 year risk 14.6%.  This is mostly driven by age.  She is going to work on diet and exercise.    Current medicines are reviewed at length with the patient today.  The patient does not have  concerns regarding medicines.  The following changes have been made:  no change  Labs/ tests ordered today include:  No orders of the defined types were placed in this encounter.    Disposition:   FU with Juno Bozard C. Oval Linsey, MD, [facility] in 2 months     Signed, Aarib Pulido C. Oval Linsey, MD, [facility]  10/06/2019 1:43 PM    [facility]

## 2019-10-06 NOTE — Patient Instructions (Signed)
Medication Instructions:  Your physician recommends that you continue on your current medications as directed. Please refer to the Current Medication list given to you today.  *If you need a refill on your cardiac medications before your next appointment, please call your pharmacy*  Lab Work: NONE   Testing/Procedures: NONE   Follow-Up: At [facility], you and your health needs are our priority.  As part of our continuing mission to provide you with exceptional heart care, we have created designated Provider Care Teams.  These Care Teams include your primary Cardiologist (physician) and Advanced Practice Providers (APPs -  Physician Assistants and Nurse Practitioners) who all work together to provide you with the care you need, when you need it.  Your next appointment:   2 month(s)  The format for your next appointment:   Virtual Visit   Provider:   You may see DR [facility]  or one of the following Advanced Practice Providers on your designated Care Team:    Kerin Ransom, PA-C  [place], [place]  Coletta Memos, [place]   Other Instructions  MONITOR AND LOG YOUR BLOOD PRESSURES TWICE A DAY  INCREASE EXERCISE TO [place]

## 2019-10-21 ENCOUNTER — Telehealth: Payer: Self-pay

## 2019-10-21 NOTE — Telephone Encounter (Signed)
PT WAS CONTACTED TO SEE IF SHE WANTED TO [facility] [place] [facility] W/TIMA ON 12/4 PER PT SHE HAS SCHEDULED APPT 12/12 SHE DECLINED APPT W/OFC DUE TO SHE WANTED TO GO WHERE THERE IS MORE ADVANCED EQUIPMENT FOR EYE EXAMS. ADV PT TO HAVE OPTH. SEND OVER NOTES

## 2019-10-26 ENCOUNTER — Other Ambulatory Visit: Payer: Self-pay

## 2019-10-26 ENCOUNTER — Ambulatory Visit (INDEPENDENT_AMBULATORY_CARE_PROVIDER_SITE_OTHER): Payer: Medicare HMO | Admitting: Neurology

## 2019-10-26 DIAGNOSIS — M79602 Pain in left arm: Secondary | ICD-10-CM

## 2019-10-26 DIAGNOSIS — R202 Paresthesia of skin: Secondary | ICD-10-CM | POA: Diagnosis not present

## 2019-10-26 NOTE — Procedures (Signed)
[facility]  [place], [place]  [place], [place] [address] Tel: [phone] Fax:  [phone] Test Date:  10/26/2019  Patient: [name] DOB: 04-03-1946 Physician: Narda Amber, DO  Sex: Female Height: 5\' 8"  Ref Phys: Neomia Dear, MD  ID#: [id number] Temp: 32.0C Technician:    Patient Complaints: This is a 73 year old female referred for evaluation of left arm pain, numbness, and tingling.  NCV & EMG Findings: Extensive electrodiagnostic testing of the left upper extremity shows:  1. All sensory responses including the left median, ulnar, and radial nerves are within normal limits. 2. All motor responses including the left median, ulnar, and radial nerves are within normal limits. 3. There is no evidence of active or chronic motor axonal loss changes affecting any of the tested muscles.  Motor unit configuration and recruitment pattern is within normal limits.  Impression: This is a normal study of the left upper extremity.  In particular, there is no evidence of polyradiculoneuropathy, sensorimotor polyneuropathy, or cervical radiculopathy.   ___________________________ Narda Amber, DO    Nerve Conduction Studies Anti Sensory Summary Table   Site NR Peak (ms) Norm Peak (ms) P-T Amp (V) Norm P-T Amp  Left Median Anti Sensory (2nd Digit)  32C  Wrist    3.8 <3.8 22.9 >10  Left Radial Anti Sensory (Base 1st Digit)  32C  Wrist    2.2 <2.8 23.5 >10  Left Ulnar Anti Sensory (5th Digit)  32C  Wrist    3.1 <3.2 14.4 >5   Motor Summary Table   Site NR Onset (ms) Norm Onset (ms) O-P Amp (mV) Norm O-P Amp Site1 Site2 Delta-0 (ms) Dist (cm) Vel (m/s) Norm Vel (m/s)  Left Median Motor (Abd Poll Brev)  32C  Wrist    3.5 <4.0 9.6 >5 Elbow Wrist 5.4 31.0 57 >50  Elbow    8.9  9.3         Left Radial Motor (Ext Ind Prop)  32C  7cm    1.4 <3.1 5.5 >5 Up Arm 7cm 1.0 6.0 60 >50  Up Arm    2.4  4.5  B-SG Up Arm 1.7 10.0 59   B-SG    4.1  4.2   A-SG B-SG 0.5 3.0 60   A-SG    4.6  4.4         Left Ulnar Motor (Abd Dig Minimi)  32C  Wrist    3.1 <3.1 11.3 >7 B Elbow Wrist 4.8 26.0 54 >50  B Elbow    7.9  10.5  A Elbow B Elbow 1.8 10.0 56 >50  A Elbow    9.7  10.2          EMG   Side Muscle Ins Act Fibs Psw Fasc Number Recrt Dur Dur. Amp Amp. Poly Poly. Comment  Left 1stDorInt Nml Nml Nml Nml Nml Nml Nml Nml Nml Nml Nml Nml N/A  Left Ext Indicis Nml Nml Nml Nml Nml Nml Nml Nml Nml Nml Nml Nml N/A  Left PronatorTeres Nml Nml Nml Nml Nml Nml Nml Nml Nml Nml Nml Nml N/A  Left Biceps Nml Nml Nml Nml Nml Nml Nml Nml Nml Nml Nml Nml N/A  Left Triceps Nml Nml Nml Nml Nml Nml Nml Nml Nml Nml Nml Nml N/A  Left Deltoid Nml Nml Nml Nml Nml Nml Nml Nml Nml Nml Nml Nml N/A      Waveforms:

## 2019-11-06 DIAGNOSIS — H524 Presbyopia: Secondary | ICD-10-CM | POA: Diagnosis not present

## 2019-11-16 DIAGNOSIS — R609 Edema, unspecified: Secondary | ICD-10-CM | POA: Diagnosis not present

## 2019-11-16 DIAGNOSIS — M1990 Unspecified osteoarthritis, unspecified site: Secondary | ICD-10-CM | POA: Diagnosis not present

## 2019-11-16 DIAGNOSIS — G4730 Sleep apnea, unspecified: Secondary | ICD-10-CM | POA: Diagnosis not present

## 2019-11-16 DIAGNOSIS — Z008 Encounter for other general examination: Secondary | ICD-10-CM | POA: Diagnosis not present

## 2019-11-16 DIAGNOSIS — I10 Essential (primary) hypertension: Secondary | ICD-10-CM | POA: Diagnosis not present

## 2019-11-16 DIAGNOSIS — Z87891 Personal history of nicotine dependence: Secondary | ICD-10-CM | POA: Diagnosis not present

## 2019-11-16 DIAGNOSIS — I499 Cardiac arrhythmia, unspecified: Secondary | ICD-10-CM | POA: Diagnosis not present

## 2019-11-16 DIAGNOSIS — K219 Gastro-esophageal reflux disease without esophagitis: Secondary | ICD-10-CM | POA: Diagnosis not present

## 2019-11-16 DIAGNOSIS — G8929 Other chronic pain: Secondary | ICD-10-CM | POA: Diagnosis not present

## 2019-11-16 DIAGNOSIS — M5430 Sciatica, unspecified side: Secondary | ICD-10-CM | POA: Diagnosis not present

## 2019-11-16 DIAGNOSIS — I739 Peripheral vascular disease, unspecified: Secondary | ICD-10-CM | POA: Diagnosis not present

## 2019-11-25 ENCOUNTER — Ambulatory Visit (INDEPENDENT_AMBULATORY_CARE_PROVIDER_SITE_OTHER): Payer: Medicare HMO | Admitting: Internal Medicine

## 2019-11-25 ENCOUNTER — Encounter: Payer: Self-pay | Admitting: Internal Medicine

## 2019-11-25 ENCOUNTER — Other Ambulatory Visit: Payer: Self-pay

## 2019-11-25 VITALS — BP 118/78 | HR 84 | Temp 98.1°F

## 2019-11-25 DIAGNOSIS — I739 Peripheral vascular disease, unspecified: Secondary | ICD-10-CM

## 2019-11-25 DIAGNOSIS — E669 Obesity, unspecified: Secondary | ICD-10-CM | POA: Diagnosis not present

## 2019-11-25 NOTE — Progress Notes (Signed)
This visit occurred during the SARS-CoV-2 public health emergency.  Safety protocols were in place, including screening questions prior to the visit, additional usage of staff PPE, and extensive cleaning of exam room while observing appropriate contact time as indicated for disinfecting solutions.  Subjective:     Patient ID: [name] , female    DOB: August 21, 1946 , 74 y.o.   MRN: [id number]   Chief Complaint  Patient presents with  . Referral    patient needs a referral for PAD    HPI Pt had medicare wellness physical at home with NP from [facility] last week and had PAD testing and is positive on the L. Denies hx of being a smoker before. Has been having severe numbness, cold and stinging from her knee to her L buttocks., and mild of R knee to distal thigh x 12 months and is getting worse.  Her skin gets cold where symptoms are. She thought is was low B12, but once it was back to above nl, her symptoms did not get better.    Past Medical History:  Diagnosis Date  . Falls   . Hypertension   . Varicose veins of both lower extremities   . Vitamin D deficiency      Family History  Problem Relation Age of Onset  . Hypertension Mother   . Hyperthyroidism Mother   . Hypertension Father   . Prostate cancer Father   . Diabetes Brother   . Breast cancer Paternal Aunt   . Breast cancer Paternal Grandmother      Current Outpatient Medications:  .  acetaminophen (TYLENOL) 500 MG tablet, Take 1,000 mg by mouth daily as needed for moderate pain., Disp: , Rfl:  .  Ascorbic Acid (VITAMIN C) 1000 MG tablet, Take 1,000 mg by mouth daily., Disp: , Rfl:  .  ASTRAGALUS PO, Take by mouth., Disp: , Rfl:  .  COLLAGEN PO, Take by mouth., Disp: , Rfl:  .  Cyanocobalamin (VITAMIN B 12 PO), Take by mouth., Disp: , Rfl:  .  Diclofenac Sodium (PENNSAID) 2 % SOLN, Place onto the skin., Disp: , Rfl:  .  ECHINACEA EXTRACT PO, Take by mouth., Disp: , Rfl:  .  lisinopril (ZESTRIL) 10 MG tablet, Take 1  tablet (10 mg total) by mouth daily., Disp: 90 tablet, Rfl: 1 .  Magnesium 100 MG CAPS, Take by mouth., Disp: , Rfl:  .  Multiple Vitamin (MULTIVITAMIN) capsule, Take 1 capsule by mouth daily., Disp: , Rfl:  .  Pyridoxine HCl (VITAMIN B6 PO), Take by mouth., Disp: , Rfl:  .  VITAMIN D PO, Take by mouth., Disp: , Rfl:  .  Zinc 30 MG CAPS, Take by mouth., Disp: , Rfl:  .  CALCIUM PO, Take by mouth., Disp: , Rfl:    No Known Allergies   Review of Systems  Has been having severe numbness, cold and stinging from her knee to her L buttocks., and mild of R knee to distal thigh. The rest of 10 point ROS is neg.  Today's Vitals   11/25/19 1555  BP: 118/78  Pulse: 84  Temp: 98.1 F (36.7 C)  TempSrc: Oral  PainSc: 4    There is no height or weight on file to calculate BMI.   Objective:  Physical Exam Vitals and nursing note reviewed.  Constitutional:      General: She is not in acute distress.    Appearance: She is obese. She is not toxic-appearing.  HENT:  Head: Normocephalic.     Right Ear: External ear normal.     Left Ear: External ear normal.  Eyes:     General: No scleral icterus.    Conjunctiva/sclera: Conjunctivae normal.  Cardiovascular:     Rate and Rhythm: Normal rate and regular rhythm.  Pulmonary:     Effort: Pulmonary effort is normal.     Breath sounds: Normal breath sounds.  Musculoskeletal:        General: No swelling or deformity.     Cervical back: Neck supple.     Right lower leg: No edema.     Left lower leg: No edema.  Skin:    Coloration: Skin is not pale.     Findings: No bruising, erythema, lesion or rash.  Neurological:     General: No focal deficit present.     Mental Status: She is alert and oriented to person, place, and time.     Motor: No weakness.     Gait: Gait normal.     Deep Tendon Reflexes: Reflexes normal.  Psychiatric:        Mood and Affect: Mood normal.        Behavior: Behavior normal.        Thought Content: Thought content  normal.        Judgment: Judgment normal.     Vascular- unable to palpate pulse on PT, L femoral,                      R radial pulse +1/4, +2/4 on L Assessment And Plan:    1. PAD (peripheral artery disease) ([place])- new. She already has a cardiologist and would like to see her, or whoever does vascular testing at her clinic. I reviewed the PAD report faxed to [place] today.  - Ambulatory referral to Cardiology 2- Obesity- chronic. Is aware she needs to work on wt loss.   FU as scheduled with Dr Baird Cancer.    Tensley Wery RODRIGUEZ-SOUTHWORTH, PA-C    THE PATIENT IS ENCOURAGED TO PRACTICE SOCIAL DISTANCING DUE TO THE COVID-19 PANDEMIC.

## 2019-12-02 DIAGNOSIS — H25013 Cortical age-related cataract, bilateral: Secondary | ICD-10-CM | POA: Diagnosis not present

## 2019-12-02 DIAGNOSIS — H2513 Age-related nuclear cataract, bilateral: Secondary | ICD-10-CM | POA: Diagnosis not present

## 2019-12-02 DIAGNOSIS — H2512 Age-related nuclear cataract, left eye: Secondary | ICD-10-CM | POA: Diagnosis not present

## 2019-12-02 DIAGNOSIS — H25043 Posterior subcapsular polar age-related cataract, bilateral: Secondary | ICD-10-CM | POA: Diagnosis not present

## 2019-12-02 DIAGNOSIS — H18413 Arcus senilis, bilateral: Secondary | ICD-10-CM | POA: Diagnosis not present

## 2019-12-02 NOTE — Progress Notes (Signed)
Cardiology Clinic Note   Patient Name: [name] Date of Encounter: 12/03/2019  Primary Care Provider:  Glendale Chard, MD Primary Cardiologist:  Skeet Latch, MD  Patient Profile    [name] 74 year old female presents today for follow-up of her PAD and hypertension.  Past Medical History    Past Medical History:  Diagnosis Date  . Falls   . Hypertension   . Raynaud's disease    age 89's  . Varicose veins of both lower extremities   . Vitamin B12 deficiency    2020  . Vitamin D deficiency    Past Surgical History:  Procedure Laterality Date  . BREAST BIOPSY Right   . BREAST EXCISIONAL BIOPSY Right   . FOOT FRACTURE SURGERY Right   . TUBAL LIGATION      Allergies  No Known Allergies  History of Present Illness    [name] has a past medical history of obesity, hypertension, and peripheral arterial disease.  In 06/2019 she was seen for hypertension.  She had been counseled on diet and exercise was produced a good result with her return visit 2 months later.  She was initially started on antihypertensive medications in 2018 including lisinopril.  This made her blood pressure too low so she stopped taking the medication.  Her blood pressure then increased and she presented to [place] [facility].  She was started on HCTZ.  However, this produced electrolyte abnormalities and was discontinued 05/2019. She was last seen by Dr. Oval Linsey on 10/06/2019.  During that time her blood pressure was somewhat labile.  However she believed that since discontinuing her hydrochlorothiazide and starting lisinopril 10 her blood pressure was better controlled.  At that time she was not checking her blood pressure regularly.  She indicated that she knew when her blood pressure was too high or too low due to feeling dizzy.  She denied chest pain.  She did have some exertional dyspnea which was attributed to increased weight.  She had also not been as physically active and noticed  shortness of breath with ambulation upstairs.  She indicated that this has been worse over the last 8 or 9 months.  She also indicated that she had mild orthopnea that was attributed to allergies and congestion.  She denied PND.  Finally, she indicated that she was vegetarian.  She presents today for a evaluation of her peripheral arterial disease and states she has noticed increase frequency of claudication with ambulation.  She indicates that she was very active walking 6 months ago but has been unable to continue her walking due to pain numbness and cramping in her calves.  She had a insurance physical and was told that she had peripheral arterial disease and that she needed to follow-up with her PCP.  She presented to her PCP who found vitamin B12 deficiency.  This was corrected with vitamin B12 injections.  She states that she is also restarted her hydrochlorothiazide but does not know the dose.  She will contact the office to relay this information.  I will order ABIs and LEAs.  I will order a BMP , magnesium, and give her a blood pressure log salty 6 handout.  She denies chest pain, increased shortness of breath, increased lower extremity edema, fatigue, palpitations, melena, hematuria, hemoptysis, diaphoresis, weakness, presyncope, syncope, orthopnea, and PND.   Home Medications    Prior to Admission medications   Medication Sig Start Date End Date Taking? Authorizing Provider  acetaminophen (TYLENOL) 500 MG tablet Take 1,000 mg  by mouth daily as needed for moderate pain.    [provider]  Ascorbic Acid (VITAMIN C) 1000 MG tablet Take 1,000 mg by mouth daily.    [provider]  ASTRAGALUS PO Take by mouth.    [provider]  CALCIUM PO Take by mouth.    [provider]  COLLAGEN PO Take by mouth.    [provider]  Cyanocobalamin (VITAMIN B 12 PO) Take by mouth.    [provider]  Diclofenac Sodium (PENNSAID) 2 % SOLN Place onto the  skin.    [provider]  ECHINACEA EXTRACT PO Take by mouth.    [provider]  lisinopril (ZESTRIL) 10 MG tablet Take 1 tablet (10 mg total) by mouth daily. 07/15/19 07/14/20  Glendale Chard, MD  Magnesium 100 MG CAPS Take by mouth.    [provider]  Multiple Vitamin (MULTIVITAMIN) capsule Take 1 capsule by mouth daily.    [provider]  Pyridoxine HCl (VITAMIN B6 PO) Take by mouth.    [provider]  VITAMIN D PO Take by mouth.    [provider]  Zinc 30 MG CAPS Take by mouth.    [provider]    Family History    Family History  Problem Relation Age of Onset  . Hypertension Mother   . Hyperthyroidism Mother   . Hypertension Father   . Prostate cancer Father   . Diabetes Brother   . Breast cancer Paternal Aunt   . Breast cancer Paternal Grandmother   . Peripheral vascular disease Other    She indicated that her mother is alive. She indicated that her father is deceased. She indicated that her brother is alive. She indicated that the status of her paternal grandmother is unknown. She indicated that the status of her paternal aunt is unknown. She reported the following about her other: paternal cousing.  Social History    Social History   Socioeconomic History  . Marital status: Single    Spouse name: Not on file  . Number of children: Not on file  . Years of education: Not on file  . Highest education level: Not on file  Occupational History  . Not on file  Tobacco Use  . Smoking status: Former Research scientist (life sciences)  . Smokeless tobacco: Never Used  . Tobacco comment: quit at age 78,only smoked for 1-2 years  Substance and Sexual Activity  . Alcohol use: Yes    Comment: 2 drinks weekly  . Drug use: No  . Sexual activity: Not Currently  Other Topics Concern  . Not on file  Social History Narrative  . Not on file   Social Determinants of Health   Financial Resource Strain: Low Risk   . Difficulty of Paying  Living Expenses: Not hard at all  Food Insecurity: No Food Insecurity  . Worried About Charity fundraiser in the Last Year: Never true  . Ran Out of Food in the Last Year: Never true  Transportation Needs: No Transportation Needs  . Lack of Transportation (Medical): No  . Lack of Transportation (Non-Medical): No  Physical Activity: Inactive  . Days of Exercise per Week: 0 days  . Minutes of Exercise per Session: 0 min  Stress: No Stress Concern Present  . Feeling of Stress : Not at all  Social Connections:   . Frequency of Communication with Friends and Family: Not on file  . Frequency of Social Gatherings with Friends and Family: Not on  file  . Attends Religious Services: Not on file  . Active Member of Clubs or Organizations: Not on file  . Attends Archivist Meetings: Not on file  . Marital Status: Not on file  Intimate Partner Violence: Not At Risk  . Fear of Current or Ex-Partner: No  . Emotionally Abused: No  . Physically Abused: No  . Sexually Abused: No     Review of Systems    General:  No chills, fever, night sweats or weight changes.  Cardiovascular:  No chest pain, dyspnea on exertion, edema, orthopnea, palpitations, paroxysmal nocturnal dyspnea. Dermatological: No rash, lesions/masses Respiratory: No cough, dyspnea Urologic: No hematuria, dysuria Abdominal:   No nausea, vomiting, diarrhea, bright red blood per rectum, melena, or hematemesis Musculoskeletal: Bilateral lower extremity cramping with walking Neurologic:  No visual changes, wkns, changes in mental status. All other systems reviewed and are otherwise negative except as noted above.  Physical Exam    VS:  BP (!) 144/82   Pulse 69   Ht 5\' 8"  (1.727 m)   BMI 33.45 kg/m  , BMI Body mass index is 33.45 kg/m. GEN: Well nourished, well developed, in no acute distress. HEENT: normal. Neck: Supple, no JVD, carotid bruits, or masses. Cardiac: RRR, no murmurs, rubs, or gallops. No clubbing,  cyanosis, edema.  Radials/DP/PT 1+ and equal bilaterally.  Respiratory:  Respirations regular and unlabored, clear to auscultation bilaterally. GI: Soft, nontender, nondistended, BS + x 4. MS: no deformity or atrophy. Skin: warm and dry, no rash. Neuro:  Strength and sensation are intact. Psych: Normal affect.  Accessory Clinical Findings    ECG personally reviewed by me today-normal sinus rhythm 65 bpm- No acute changes  EKG 07/19/2019 Sinus bradycardia no T wave inversion or ST deviation  Vascular ultrasound 03/30/2018 Final Interpretation: Right: No evidence of common femoral vein obstruction. Left: There is no evidence of deep vein thrombosis in the lower extremity. However, portions of this examination were limited- see technologist comments above.    Assessment & Plan   1.  Peripheral arterial disease/bilateral lower extremity claudication -patient notices claudication, numbness, and pain with increased ambulation.  This is been present for the last 6 months. Order ABIs Order arterial Dopplers  Essential hypertension-BP today 144/82.  Labile I will. Continue lisinopril 10 mg tablet daily Heart healthy low-sodium diet-salty 6 given Increase physical activity as tolerated Order BMP, magnesium Given blood pressure log  Hyperlipidemia-07/15/2019: Cholesterol, Total 200; HDL 67; LDL Calculated 118; Triglycerides 73 Heart healthy low-sodium high-fiber diet Increase physical activity as tolerated   Disposition: Follow-up with Dr. Oval Linsey after lower extremity studies.  Jossie Ng. [facility] [address] Office [phone] Fax [phone]

## 2019-12-03 ENCOUNTER — Ambulatory Visit: Payer: Medicare HMO | Admitting: General Practice

## 2019-12-03 ENCOUNTER — Other Ambulatory Visit: Payer: Self-pay

## 2019-12-03 ENCOUNTER — Encounter: Payer: Self-pay | Admitting: General Practice

## 2019-12-03 VITALS — BP 144/82 | HR 69 | Ht 68.0 in

## 2019-12-03 DIAGNOSIS — I10 Essential (primary) hypertension: Secondary | ICD-10-CM

## 2019-12-03 DIAGNOSIS — Z79899 Other long term (current) drug therapy: Secondary | ICD-10-CM | POA: Diagnosis not present

## 2019-12-03 DIAGNOSIS — I739 Peripheral vascular disease, unspecified: Secondary | ICD-10-CM | POA: Diagnosis not present

## 2019-12-03 DIAGNOSIS — E7800 Pure hypercholesterolemia, unspecified: Secondary | ICD-10-CM

## 2019-12-03 MED ORDER — HYDROCHLOROTHIAZIDE 25 MG PO TABS: 25.0000 mg | ORAL_TABLET | Freq: Every day | ORAL | 6 refills | Status: DC

## 2019-12-03 NOTE — Patient Instructions (Addendum)
Labwork: bmet and mag today HERE IN OUR OFFICE AT [facility]    If you have labs (blood work) drawn today and your tests are completely normal, you will receive your results only by: [name] MyChart Message (if you have MyChart) OR . A paper copy in the mail If you have any lab test that is abnormal or we need to change your treatment, we will call you to review the results.  Testing/Procedures: Your physician has requested that you have an ankle brachial index (ABI). During this test an ultrasound and blood pressure cuff are used to evaluate the arteries that supply the arms and legs with blood. Allow thirty minutes for this exam. There are no restrictions or special instructions.  Special Instructions: PLEASE TAK AND LOG YOUR BLOOD PRESSURE DAILY  PLEASE READ AND FOLLOW SALTY 6 ATTACHED  Reduce your risk of getting COVID-19 With your heart disease it is especially important for people at increased risk of severe illness from COVID-19, and those who live with them, to protect themselves from getting COVID-19. The best way to protect yourself and to help reduce the spread of the virus that causes COVID-19 is to: [name] Limit your interactions with other people as much as possible. . Take precautions to prevent getting COVID-19 when you do interact with others. If you start feeling sick and think you may have COVID-19, get in touch with your healthcare provider within 24 hours.  Follow-Up: IN AFTER ABI'S   In Person TIFFANY [place], MD .    At [facility], you and your health needs are our priority.  As part of our continuing mission to provide you with exceptional heart care, we have created designated Provider Care Teams.  These Care Teams include your primary Cardiologist (physician) and Advanced Practice Providers (APPs -  Physician Assistants and Nurse Practitioners) who all work together to provide you with the care you need, when you need it.  Thank you for choosing [facility] at  [facility]!!

## 2019-12-04 LAB — BASIC METABOLIC PANEL
BUN/Creatinine Ratio: 12 (ref 12–28)
BUN: 12 mg/dL (ref 8–27)
CO2: 26 mmol/L (ref 20–29)
Calcium: 9.7 mg/dL (ref 8.7–10.3)
Chloride: 101 mmol/L (ref 96–106)
Creatinine, Ser: 1.03 mg/dL — ABNORMAL HIGH (ref 0.57–1.00)
GFR calc Af Amer: 63 mL/min/{1.73_m2} (ref >59)
GFR calc non Af Amer: 54 mL/min/{1.73_m2} — ABNORMAL LOW (ref >59)
Glucose: 86 mg/dL (ref 65–99)
Potassium: 5 mmol/L (ref 3.5–5.2)
Sodium: 140 mmol/L (ref 134–144)

## 2019-12-04 LAB — MAGNESIUM: Magnesium: 2.1 mg/dL (ref 1.6–2.3)

## 2019-12-06 ENCOUNTER — Encounter: Payer: Self-pay | Admitting: Internal Medicine

## 2019-12-08 ENCOUNTER — Ambulatory Visit (HOSPITAL_COMMUNITY)
Admission: RE | Admit: 2019-12-08 | Discharge: 2019-12-08 | Disposition: A | Discharge status: Home / Self Care | Payer: Medicare HMO | Source: Ambulatory Visit | Attending: Cardiology | Admitting: Cardiology

## 2019-12-08 ENCOUNTER — Ambulatory Visit: Payer: Medicare HMO | Admitting: Internal Medicine

## 2019-12-08 ENCOUNTER — Encounter (HOSPITAL_COMMUNITY): Payer: Medicare HMO

## 2019-12-08 ENCOUNTER — Other Ambulatory Visit: Payer: Self-pay

## 2019-12-08 DIAGNOSIS — I10 Essential (primary) hypertension: Secondary | ICD-10-CM

## 2019-12-08 DIAGNOSIS — I739 Peripheral vascular disease, unspecified: Secondary | ICD-10-CM

## 2019-12-09 ENCOUNTER — Encounter: Payer: Self-pay | Admitting: Internal Medicine

## 2019-12-09 ENCOUNTER — Ambulatory Visit (INDEPENDENT_AMBULATORY_CARE_PROVIDER_SITE_OTHER): Payer: Medicare HMO | Admitting: Internal Medicine

## 2019-12-09 VITALS — BP 128/70 | HR 73 | Temp 98.0°F

## 2019-12-09 DIAGNOSIS — R202 Paresthesia of skin: Secondary | ICD-10-CM

## 2019-12-09 DIAGNOSIS — M5416 Radiculopathy, lumbar region: Secondary | ICD-10-CM | POA: Diagnosis not present

## 2019-12-09 DIAGNOSIS — M791 Myalgia, unspecified site: Secondary | ICD-10-CM

## 2019-12-09 DIAGNOSIS — I10 Essential (primary) hypertension: Secondary | ICD-10-CM | POA: Diagnosis not present

## 2019-12-09 MED ORDER — HYDROCHLOROTHIAZIDE 25 MG PO TABS: 25.0000 mg | ORAL_TABLET | Freq: Every day | ORAL | 1 refills | Status: DC

## 2019-12-09 MED ORDER — DIAZEPAM 2 MG PO TABS: ORAL_TABLET | ORAL | 0 refills | Status: DC

## 2019-12-09 NOTE — Patient Instructions (Addendum)
Dr. Drue Second  [facility]   [phone]    Radicular Pain Radicular pain is a type of pain that spreads from your back or neck along a spinal nerve. Spinal nerves are nerves that leave the spinal cord and go to the muscles. Radicular pain is sometimes called radiculopathy, radiculitis, or a pinched nerve. When you have this type of pain, you may also have weakness, numbness, or tingling in the area of your body that is supplied by the nerve. The pain may feel sharp and burning. Depending on which spinal nerve is affected, the pain may occur in the:  Neck area (cervical radicular pain). You may also feel pain, numbness, weakness, or tingling in the arms.  Mid-spine area (thoracic radicular pain). You would feel this pain in the back and chest. This type is rare.  Lower back area (lumbar radicular pain). You would feel this pain as low back pain. You may feel pain, numbness, weakness, or tingling in the buttocks or legs. Sciatica is a type of lumbar radicular pain that shoots down the back of the leg. Radicular pain occurs when one of the spinal nerves becomes irritated or squeezed (compressed). It is often caused by something pushing on a spinal nerve, such as one of the bones of the spine (vertebrae) or one of the round cushions between vertebrae (intervertebral disks). This can result from:  An injury.  Wear and tear or aging of a disk.  The growth of a bone spur that pushes on the nerve. Radicular pain often goes away when you follow instructions from your health care provider for relieving pain at home. Follow these instructions at home: Managing pain      If directed, put ice on the affected area: ? Put ice in a plastic bag. ? Place a towel between your skin and the bag. ? Leave the ice on for 20 minutes, 2-3 times a day.  If directed, apply heat to the affected area as often as told by your health care provider. Use the heat source that your health care  provider recommends, such as a moist heat pack or a heating pad. ? Place a towel between your skin and the heat source. ? Leave the heat on for 20-30 minutes. ? Remove the heat if your skin turns bright red. This is especially important if you are unable to feel pain, heat, or cold. You may have a greater risk of getting burned. Activity   Do not sit or rest in bed for long periods of time.  Try to stay as active as possible. Ask your health care provider what type of exercise or activity is best for you.  Avoid activities that make your pain worse, such as bending and lifting.  Do not lift anything that is heavier than 10 lb (4.5 kg), or the limit that you are told, until your health care provider says that it is safe.  Practice using proper technique when lifting items. Proper lifting technique involves bending your knees and rising up.  Do strength and range-of-motion exercises only as told by your health care provider or physical therapist. General instructions  Take over-the-counter and prescription medicines only as told by your health care provider.  Pay attention to any changes in your symptoms.  Keep all follow-up visits as told by your health care provider. This is important. ? Your health care provider may send you to a physical therapist to help with this pain. Contact a health care provider if:  Your pain and other symptoms get worse.  Your pain medicine is not helping.  Your pain has not improved after a few weeks of home care.  You have a fever. Get help right away if:  You have severe pain, weakness, or numbness.  You have difficulty with bladder or bowel control. Summary  Radicular pain is a type of pain that spreads from your back or neck along a spinal nerve.  When you have radicular pain, you may also have weakness, numbness, or tingling in the area of your body that is supplied by the nerve.  The pain may feel sharp or burning.  Radicular pain may  be treated with ice, heat, medicines, or physical therapy. This information is not intended to replace advice given to you by your health care provider. Make sure you discuss any questions you have with your health care provider. Document Revised: 05/19/2018 Document Reviewed: 05/19/2018 Elsevier Patient Education  [place].

## 2019-12-10 ENCOUNTER — Telehealth: Payer: Medicare HMO | Admitting: Cardiovascular Disease

## 2019-12-12 NOTE — Progress Notes (Signed)
This visit occurred during the SARS-CoV-2 public health emergency.  Safety protocols were in place, including screening questions prior to the visit, additional usage of staff PPE, and extensive cleaning of exam room while observing appropriate contact time as indicated for disinfecting solutions.  Subjective:     Patient ID: [name] , female    DOB: 1946/01/22 , 74 y.o.   MRN: [id number]   Chief Complaint  Patient presents with  . Hypertension  . Numbness    LEGS    HPI  Hypertension This is a chronic problem. The current episode started more than 1 year ago. The problem has been gradually improving since onset. The problem is controlled. Pertinent negatives include no blurred vision, chest pain, palpitations or shortness of breath. Risk factors for coronary artery disease include obesity, post-menopausal state and sedentary lifestyle. Past treatments include ACE inhibitors. The current treatment provides moderate improvement.     Past Medical History:  Diagnosis Date  . Falls   . Hypertension   . Raynaud's disease    age 27's  . Varicose veins of both lower extremities   . Vitamin B12 deficiency    2020  . Vitamin D deficiency      Family History  Problem Relation Age of Onset  . Hypertension Mother   . Hyperthyroidism Mother   . Hypertension Father   . Prostate cancer Father   . Diabetes Brother   . Breast cancer Paternal Aunt   . Breast cancer Paternal Grandmother   . Peripheral vascular disease Other      Current Outpatient Medications:  .  acetaminophen (TYLENOL) 500 MG tablet, Take 1,000 mg by mouth daily as needed for moderate pain., Disp: , Rfl:  .  Ascorbic Acid (VITAMIN C) 1000 MG tablet, Take 1,000 mg by mouth daily., Disp: , Rfl:  .  ASTRAGALUS PO, Take by mouth., Disp: , Rfl:  .  CALCIUM PO, Take by mouth., Disp: , Rfl:  .  COLLAGEN PO, Take by mouth., Disp: , Rfl:  .  Cyanocobalamin (VITAMIN B 12 PO), Take by mouth., Disp: , Rfl:  .   Diclofenac Sodium (PENNSAID) 2 % SOLN, Place onto the skin., Disp: , Rfl:  .  ECHINACEA EXTRACT PO, Take by mouth., Disp: , Rfl:  .  hydrochlorothiazide (HYDRODIURIL) 25 MG tablet, Take 1 tablet (25 mg total) by mouth daily., Disp: 90 tablet, Rfl: 1 .  lisinopril (ZESTRIL) 10 MG tablet, Take 1 tablet (10 mg total) by mouth daily., Disp: 90 tablet, Rfl: 1 .  Magnesium 100 MG CAPS, Take by mouth., Disp: , Rfl:  .  Multiple Vitamin (MULTIVITAMIN) capsule, Take 1 capsule by mouth daily., Disp: , Rfl:  .  Pyridoxine HCl (VITAMIN B6 PO), Take by mouth., Disp: , Rfl:  .  VITAMIN D PO, Take by mouth., Disp: , Rfl:  .  Zinc 30 MG CAPS, Take by mouth., Disp: , Rfl:  .  diazepam (VALIUM) 2 MG tablet, Take one tab one hour prior to procedure, repeat in one hour if needed, Disp: 2 tablet, Rfl: 0   No Known Allergies   Review of Systems  Constitutional: Negative.   Eyes: Negative for blurred vision.  Respiratory: Negative.  Negative for shortness of breath.   Cardiovascular: Negative.  Negative for chest pain and palpitations.  Gastrointestinal: Negative.   Neurological: Positive for numbness.       She c/o numbness of b/l lower extremities. There is no specific trigger of her sx. Denies LE weakness.  Also  followed by Dr. Neomia Dear for pain mgmt for chronic back pain.   Psychiatric/Behavioral: Negative.      Today's Vitals   12/09/19 1118  BP: 128/70  Pulse: 73  Temp: 98 F (36.7 C)   There is no height or weight on file to calculate BMI.   Objective:  Physical Exam Vitals and nursing note reviewed.  Constitutional:      Appearance: Normal appearance.  HENT:     Head: Normocephalic and atraumatic.  Cardiovascular:     Rate and Rhythm: Normal rate and regular rhythm.     Heart sounds: Normal heart sounds.  Pulmonary:     Effort: Pulmonary effort is normal.     Breath sounds: Normal breath sounds.  Skin:    General: Skin is warm.  Neurological:     General: No focal deficit  present.     Mental Status: She is alert.  Psychiatric:        Mood and Affect: Mood normal.        Behavior: Behavior normal.         Assessment And Plan:     1. Essential hypertension, benign  Chronic, well controlled. She will continue with current meds. She is encouraged to avoid adding salt to her foods.   2. Paresthesia of both lower extremities  I suggested having a nerve conduction study; however, she reports Dr. Niel Hummer has already done this for her. She is not able to communicate the results. I will request a copy of these records. She has already had Vitamin B12 levels checked and these are normal.   3. Myalgia  Intermittent. She is encouraged to stay well hydrated and to take magnesium nightly.   4. Lumbar radiculopathy, chronic  Chronic. I will refer her for MRI lumbar spine. She will also consider chiropractic therapy.   - MR Lumbar Spine Wo Contrast; Future        Maximino Greenland, MD    THE PATIENT IS ENCOURAGED TO PRACTICE SOCIAL DISTANCING DUE TO THE COVID-19 PANDEMIC.

## 2019-12-16 ENCOUNTER — Ambulatory Visit: Payer: Medicare HMO

## 2019-12-17 DIAGNOSIS — Z20828 Contact with and (suspected) exposure to other viral communicable diseases: Secondary | ICD-10-CM | POA: Diagnosis not present

## 2019-12-21 ENCOUNTER — Ambulatory Visit: Payer: Medicare HMO

## 2019-12-25 ENCOUNTER — Ambulatory Visit: Payer: Medicare HMO

## 2019-12-28 ENCOUNTER — Other Ambulatory Visit: Payer: Self-pay

## 2019-12-28 ENCOUNTER — Encounter: Payer: Self-pay | Admitting: Cardiovascular Disease

## 2019-12-28 ENCOUNTER — Ambulatory Visit: Payer: Medicare HMO | Admitting: Cardiovascular Disease

## 2019-12-28 VITALS — BP 140/78 | HR 67 | Temp 95.0°F

## 2019-12-28 DIAGNOSIS — E7800 Pure hypercholesterolemia, unspecified: Secondary | ICD-10-CM | POA: Diagnosis not present

## 2019-12-28 DIAGNOSIS — I10 Essential (primary) hypertension: Secondary | ICD-10-CM

## 2019-12-28 DIAGNOSIS — Z5181 Encounter for therapeutic drug level monitoring: Secondary | ICD-10-CM | POA: Diagnosis not present

## 2019-12-28 MED ORDER — LISINOPRIL 20 MG PO TABS: 20.0000 mg | ORAL_TABLET | Freq: Every day | ORAL | 1 refills | Status: DC

## 2019-12-28 NOTE — Patient Instructions (Signed)
Medication Instructions:  INCREASE YOUR LISINOPRIL TO 20 MG DAILY   *If you need a refill on your cardiac medications before your next appointment, please call your pharmacy*  Lab Work: BMET  IN 1 WEEK   If you have labs (blood work) drawn today and your tests are completely normal, you will receive your results only by: [name] MyChart Message (if you have MyChart) OR . A paper copy in the mail If you have any lab test that is abnormal or we need to change your treatment, we will call you to review the results.  Testing/Procedures: NONE   Follow-Up: At [facility], you and your health needs are our priority.  As part of our continuing mission to provide you with exceptional heart care, we have created designated Provider Care Teams.  These Care Teams include your primary Cardiologist (physician) and Advanced Practice Providers (APPs -  Physician Assistants and Nurse Practitioners) who all work together to provide you with the care you need, when you need it.  Your next appointment:   3 month(s)  The format for your next appointment:   In Person  Provider:   You may see Skeet Latch, MD or one of the following Advanced Practice Providers on your designated Care Team:    Kerin Ransom, PA-C  [place], [place]  Coletta Memos, FNP   Other Instructions INCREASE YOUR EXERCISE TO [place]

## 2019-12-28 NOTE — Progress Notes (Signed)
Cardiology Office Note   Date:  01/11/2020   ID:  [name], [name] 1946/05/22, MRN [id number]  PCP:  Glendale Chard, MD  Cardiologist:   Skeet Latch, MD   No chief complaint on file.     History of Present Illness: [name] is a 74 y.o. female with obesity and hypertension who presents for management of hypertension.  She saw Dr. Baird Cancer 06/2019 and her BP was poorly controlled.  She was counseled on diet and exercise and it was much better when she returned in 2 months.` She was first started on antihypertensives two years ago.  She was initially started on lisinopril and her BP got too low so she stopped taking it.  Since then it has spiked and she was seen at [facility].  HCTZ was added to her regimen.  However she developed electrolyte abnormalities and this was discontinued at the end of July.  Since her last appointment [name] has been OK.  She has been struggling with sciatica and knee pain.  This makes it difficult for her to exercise.  She thinks that her BP machine has been running high and is not accurate.  She has otherwise felt well physcially.  She continues to have a very healthy diet.       Past Medical History:  Diagnosis Date  . Falls   . Hypertension   . Pure hypercholesterolemia 01/11/2020  . Raynaud's disease    age 36's  . Varicose veins of both lower extremities   . Vitamin B12 deficiency    2020  . Vitamin D deficiency     Past Surgical History:  Procedure Laterality Date  . BREAST BIOPSY Right   . BREAST EXCISIONAL BIOPSY Right   . FOOT FRACTURE SURGERY Right   . TUBAL LIGATION       Current Outpatient Medications  Medication Sig Dispense Refill  . acetaminophen (TYLENOL) 500 MG tablet Take 1,000 mg by mouth daily as needed for moderate pain.    . Ascorbic Acid (VITAMIN C) 1000 MG tablet Take 1,000 mg by mouth daily.    . ASTRAGALUS PO Take by mouth.    [name] CALCIUM PO Take by mouth.    . COLLAGEN PO Take by  mouth.    . Cyanocobalamin (VITAMIN B 12 PO) Take by mouth.    . diazepam (VALIUM) 2 MG tablet Take one tab one hour prior to procedure, repeat in one hour if needed 2 tablet 0  . Diclofenac Sodium (PENNSAID) 2 % SOLN Place onto the skin.    [name] ECHINACEA EXTRACT PO Take by mouth.    . hydrochlorothiazide (HYDRODIURIL) 25 MG tablet Take 1 tablet (25 mg total) by mouth daily. 90 tablet 1  . lisinopril (ZESTRIL) 20 MG tablet Take 1 tablet (20 mg total) by mouth daily. 90 tablet 1  . Magnesium 100 MG CAPS Take by mouth.    . Multiple Vitamin (MULTIVITAMIN) capsule Take 1 capsule by mouth daily.    . Pyridoxine HCl (VITAMIN B6 PO) Take by mouth.    [name] VITAMIN D PO Take by mouth.    . Zinc 30 MG CAPS Take by mouth.     No current facility-administered medications for this visit.    Allergies:   Patient has no known allergies.    Social History:  The patient  reports that she has quit smoking. She has never used smokeless tobacco. She reports current alcohol use. She reports that she does not use drugs.  Family History:  The patient's family history includes Breast cancer in her paternal aunt and paternal grandmother; Diabetes in her brother; Hypertension in her father and mother; Hyperthyroidism in her mother; Peripheral vascular disease in an other family member; Prostate cancer in her father.    ROS:  Please see the history of present illness.   Otherwise, review of systems are positive for none.   All other systems are reviewed and negative.    PHYSICAL EXAM: VS:  BP 140/78   Pulse 67   Temp (!) 95 F (35 C)   SpO2 97%  , BMI There is no height or weight on file to calculate BMI. GENERAL:  Well appearing HEENT:  Pupils equal round and reactive, fundi not visualized, oral mucosa unremarkable NECK:  No jugular venous distention, waveform within normal limits, carotid upstroke brisk and symmetric, no bruits, no thyromegaly LUNGS:  Clear to auscultation bilaterally HEART:  RRR.  PMI not  displaced or sustained,S1 and S2 within normal limits, no S3, no S4, no clicks, no rubs, no murmurs ABD:  Flat, positive bowel sounds normal in frequency in pitch, no bruits, no rebound, no guarding, no midline pulsatile mass, no hepatomegaly, no splenomegaly EXT:  2 plus pulses throughout, no edema, no cyanosis no clubbing SKIN:  No rashes no nodules NEURO:  Cranial nerves II through XII grossly intact, motor grossly intact throughout PSYCH:  Cognitively intact, oriented to person place and time   EKG:  EKG is not ordered today. The ekg ordered 07/15/19 demonstrates sinus bradycardia.  LAFB.  Rate 59 bpm.   Recent Labs: 06/16/2019: TSH 2.110 07/15/2019: ALT 13; Hemoglobin 11.6; Platelets 296 12/03/2019: BUN 12; Creatinine, Ser 1.03; Magnesium 2.1; Potassium 5.0; Sodium 140     Lipid Panel    Component Value Date/Time   CHOL 200 (H) 07/15/2019 1130   TRIG 73 07/15/2019 1130   HDL 67 07/15/2019 1130   CHOLHDL 3.0 07/15/2019 1130   LDLCALC 118 (H) 07/15/2019 1130      Wt Readings from Last 3 Encounters:  10/06/19 220 lb (99.8 kg)  09/11/18 210 lb (95.3 kg)  03/30/18 238 lb (108 kg)      ASSESSMENT AND PLAN:  # Essential hypertension:  BP is elevated here and at home.  Increase lisinopril to 20mg .  Check BMP in one week.  Continue HCTZ.   # Pure hypercholesterolemia:  ASCVD 10 year risk 14.6%.  This is mostly driven by age.  She is going to keep working on diet and exercise.  Repeat lipids/CMP at followup.  Current medicines are reviewed at length with the patient today.  The patient does not have concerns regarding medicines.  The following changes have been made:  no change  Labs/ tests ordered today include:   Orders Placed This Encounter  Procedures  . Basic metabolic panel     Disposition:   FU with Najir Roop C. Oval Linsey, MD, [facility] in 3 months     Signed, Florean Hoobler C. Oval Linsey, MD, [facility]  01/11/2020 12:42 PM    [place]

## 2020-01-11 ENCOUNTER — Encounter: Payer: Self-pay | Admitting: Cardiovascular Disease

## 2020-01-11 DIAGNOSIS — I10 Essential (primary) hypertension: Secondary | ICD-10-CM | POA: Insufficient documentation

## 2020-01-11 DIAGNOSIS — E7800 Pure hypercholesterolemia, unspecified: Secondary | ICD-10-CM

## 2020-01-11 HISTORY — DX: Pure hypercholesterolemia, unspecified: E78.00

## 2020-01-20 ENCOUNTER — Ambulatory Visit: Payer: Medicare HMO | Attending: Internal Medicine

## 2020-01-20 ENCOUNTER — Other Ambulatory Visit: Payer: Self-pay

## 2020-01-20 DIAGNOSIS — Z20822 Contact with and (suspected) exposure to covid-19: Secondary | ICD-10-CM

## 2020-01-21 LAB — NOVEL CORONAVIRUS, NAA: SARS-CoV-2, NAA: NOT DETECTED

## 2020-02-03 DIAGNOSIS — Z20828 Contact with and (suspected) exposure to other viral communicable diseases: Secondary | ICD-10-CM | POA: Diagnosis not present

## 2020-02-04 ENCOUNTER — Encounter: Payer: Self-pay | Admitting: Internal Medicine

## 2020-02-04 DIAGNOSIS — Z1152 Encounter for screening for COVID-19: Secondary | ICD-10-CM | POA: Diagnosis not present

## 2020-03-15 DIAGNOSIS — H2513 Age-related nuclear cataract, bilateral: Secondary | ICD-10-CM | POA: Diagnosis not present

## 2020-03-15 DIAGNOSIS — H2512 Age-related nuclear cataract, left eye: Secondary | ICD-10-CM | POA: Diagnosis not present

## 2020-03-16 DIAGNOSIS — H25041 Posterior subcapsular polar age-related cataract, right eye: Secondary | ICD-10-CM | POA: Diagnosis not present

## 2020-03-16 DIAGNOSIS — H2511 Age-related nuclear cataract, right eye: Secondary | ICD-10-CM | POA: Diagnosis not present

## 2020-03-16 DIAGNOSIS — H25011 Cortical age-related cataract, right eye: Secondary | ICD-10-CM | POA: Diagnosis not present

## 2020-03-28 ENCOUNTER — Other Ambulatory Visit: Payer: Self-pay

## 2020-03-28 ENCOUNTER — Ambulatory Visit: Payer: Medicare HMO | Admitting: Cardiovascular Disease

## 2020-03-28 ENCOUNTER — Encounter: Payer: Self-pay | Admitting: Cardiovascular Disease

## 2020-03-28 VITALS — BP 138/88 | HR 71 | Ht 67.75 in | Wt 220.0 lb

## 2020-03-28 DIAGNOSIS — Z6833 Body mass index (BMI) 33.0-33.9, adult: Secondary | ICD-10-CM

## 2020-03-28 DIAGNOSIS — E7800 Pure hypercholesterolemia, unspecified: Secondary | ICD-10-CM

## 2020-03-28 DIAGNOSIS — I10 Essential (primary) hypertension: Secondary | ICD-10-CM

## 2020-03-28 DIAGNOSIS — E669 Obesity, unspecified: Secondary | ICD-10-CM

## 2020-03-28 NOTE — Progress Notes (Signed)
Cardiology Office Note   Date:  03/28/2020   ID:  [name], DOB [birth year], MRN [id number]  PCP:  Glendale Chard, MD  Cardiologist:   Skeet Latch, MD   No chief complaint on file.    History of Present Illness: [name] is a 74 y.o. female with obesity and hypertension who presents for management of hypertension.  She saw Dr. Baird Cancer 06/2019 and her BP was poorly controlled.  She was counseled on diet and exercise and it was much better when she returned in 2 months.` She was first started on antihypertensives two years ago.  She was initially started on lisinopril and her BP got too low so she stopped taking it.  Since then it has spiked and she was seen at [facility].  HCTZ was added to her regimen.  However she developed electrolyte abnormalities and this was discontinued at the end of July.  At her last appointment lisinopril was increased due to poorly controlled hypertension.  Since then her blood pressure has been running similarly to today.  She struggles with getting exercise due to chronic pain in her knee.  She recently went to [place] for a wellness clinic and her eating was much better.  However when she came back she gets back into the same tract.  She has some shortness of breath with exertion that she attributes to her weight.  She has no chest pain or pressure.  She has chronic mild lower extremity edema.  She denies orthopnea or PND.   Past Medical History:  Diagnosis Date  . Falls   . Hypertension   . Pure hypercholesterolemia 01/11/2020  . Raynaud's disease    age 21's  . Varicose veins of both lower extremities   . Vitamin B12 deficiency    2020  . Vitamin D deficiency     Past Surgical History:  Procedure Laterality Date  . BREAST BIOPSY Right   . BREAST EXCISIONAL BIOPSY Right   . FOOT FRACTURE SURGERY Right   . TUBAL LIGATION       Current Outpatient Medications  Medication Sig Dispense Refill  . acetaminophen (TYLENOL)  500 MG tablet Take 1,000 mg by mouth daily as needed for moderate pain.    . Ascorbic Acid (VITAMIN C) 1000 MG tablet Take 1,000 mg by mouth daily.    . ASTRAGALUS PO Take by mouth.    [name] CALCIUM PO Take by mouth.    . COLLAGEN PO Take by mouth.    . Cyanocobalamin (VITAMIN B 12 PO) Take by mouth.    . diazepam (VALIUM) 2 MG tablet Take one tab one hour prior to procedure, repeat in one hour if needed 2 tablet 0  . Diclofenac Sodium (PENNSAID) 2 % SOLN Place onto the skin.    [name] ECHINACEA EXTRACT PO Take by mouth.    . hydrochlorothiazide (HYDRODIURIL) 25 MG tablet Take 1 tablet (25 mg total) by mouth daily. 90 tablet 1  . lisinopril (ZESTRIL) 20 MG tablet Take 1 tablet (20 mg total) by mouth daily. 90 tablet 1  . Magnesium 100 MG CAPS Take by mouth.    . Multiple Vitamin (MULTIVITAMIN) capsule Take 1 capsule by mouth daily.    . Pyridoxine HCl (VITAMIN B6 PO) Take by mouth.    [name] VITAMIN D PO Take by mouth.    . Zinc 30 MG CAPS Take by mouth.     No current facility-administered medications for this visit.    Allergies:  Patient has no known allergies.    Social History:  The patient  reports that she has quit smoking. She has never used smokeless tobacco. She reports current alcohol use. She reports that she does not use drugs.   Family History:  The patient's family history includes Breast cancer in her paternal aunt and paternal grandmother; Diabetes in her brother; Hypertension in her father and mother; Hyperthyroidism in her mother; Peripheral vascular disease in an other family member; Prostate cancer in her father.    ROS:  Please see the history of present illness.   Otherwise, review of systems are positive for none.   All other systems are reviewed and negative.    PHYSICAL EXAM: VS:  BP 138/88   Pulse 71   Ht 5' 7.75" (1.721 m)   Wt 220 lb (99.8 kg)   SpO2 97%   BMI 33.70 kg/m  , BMI Body mass index is 33.7 kg/m. GENERAL:  Well appearing HEENT:  Pupils equal round  and reactive, fundi not visualized, oral mucosa unremarkable NECK:  No jugular venous distention, waveform within normal limits, carotid upstroke brisk and symmetric, no bruits, no thyromegaly LUNGS:  Clear to auscultation bilaterally HEART:  RRR.  PMI not displaced or sustained,S1 and S2 within normal limits, no S3, no S4, no clicks, no rubs, no murmurs ABD:  Flat, positive bowel sounds normal in frequency in pitch, no bruits, no rebound, no guarding, no midline pulsatile mass, no hepatomegaly, no splenomegaly EXT:  2 plus pulses throughout, no edema, no cyanosis no clubbing SKIN:  No rashes no nodules NEURO:  Cranial nerves II through XII grossly intact, motor grossly intact throughout PSYCH:  Cognitively intact, oriented to person place and time   EKG:  EKG is not ordered today. The ekg ordered 07/15/19 demonstrates sinus bradycardia.  LAFB.  Rate 59 bpm.   Recent Labs: 06/16/2019: TSH 2.110 07/15/2019: ALT 13; Hemoglobin 11.6; Platelets 296 12/03/2019: BUN 12; Creatinine, Ser 1.03; Magnesium 2.1; Potassium 5.0; Sodium 140     Lipid Panel    Component Value Date/Time   CHOL 200 (H) 07/15/2019 1130   TRIG 73 07/15/2019 1130   HDL 67 07/15/2019 1130   CHOLHDL 3.0 07/15/2019 1130   LDLCALC 118 (H) 07/15/2019 1130      Wt Readings from Last 3 Encounters:  03/28/20 220 lb (99.8 kg)  10/06/19 220 lb (99.8 kg)  09/11/18 210 lb (95.3 kg)      ASSESSMENT AND PLAN:  # Essential hypertension:  # Morbid obesity:  Blood pressure continues to be elevated both here and at home.  Her renal function has been stable.  Continue hydrochlorothiazide and lisinopril.  She would like to work on diet and exercise before making any additional titrations.  Recommend increasing to 150 minutes weekly.  She may tolerate a recumbent bike.  She also has a Peloton at home, which likely would not irritate her knee much.  We will refer her to healthy weight and wellness clinic 2.  She wants to work on weight  loss before increasing her medications.  # Pure hypercholesterolemia:  ASCVD 10 year risk 14.6%.  This is mostly driven by age.  She is going to keep working on diet and exercise.  Repeat lipids/CMP at followup.  Current medicines are reviewed at length with the patient today.  The patient does not have concerns regarding medicines.  The following changes have been made:  no change  Labs/ tests ordered today include:   Orders Placed This Encounter  Procedures  . Ambulatory referral to Family Practice     Disposition:   FU with Barnell Shieh C. Oval Linsey, MD, [facility] in 3 months     Signed, Francisca Langenderfer C. Oval Linsey, MD, [facility]  03/28/2020 12:28 PM    [place]

## 2020-03-28 NOTE — Patient Instructions (Addendum)
Medication Instructions:  Your physician recommends that you continue on your current medications as directed. Please refer to the Current Medication list given to you today.  *If you need a refill on your cardiac medications before your next appointment, please call your pharmacy*  Lab Work: NONE   Testing/Procedures: NONE   Follow-Up: At [facility], you and your health needs are our priority.  As part of our continuing mission to provide you with exceptional heart care, we have created designated Provider Care Teams.  These Care Teams include your primary Cardiologist (physician) and Advanced Practice Providers (APPs -  Physician Assistants and Nurse Practitioners) who all work together to provide you with the care you need, when you need it.  We recommend signing up for the patient portal called "MyChart".  Sign up information is provided on this After Visit Summary.  MyChart is used to connect with patients for Virtual Visits (Telemedicine).  Patients are able to view lab/test results, encounter notes, upcoming appointments, etc.  Non-urgent messages can be sent to your provider as well.   To learn more about what you can do with MyChart, go to [email or web address].    Your next appointment:   6 month(s)  The format for your next appointment:   In Person  Provider:  You may see Skeet Latch, MD or one of the following Advanced Practice Providers on your designated Care Team:    Kerin Ransom, PA-C  [place], [place]  Coletta Memos, FNP   You have been referred to   Ambulatory referral to [facility], MD)  Where: [place] Address: [address] [place] [address] Phone: [phone]   IF YOU DO NOT HEAR FROM WITHIN 1 WEEK YOU CAN CALL THEM DIRECTLY    Other Instructions  WORK ON EXERCISING [place]

## 2020-03-29 DIAGNOSIS — H2513 Age-related nuclear cataract, bilateral: Secondary | ICD-10-CM | POA: Diagnosis not present

## 2020-03-29 DIAGNOSIS — H2512 Age-related nuclear cataract, left eye: Secondary | ICD-10-CM | POA: Diagnosis not present

## 2020-03-29 DIAGNOSIS — H2511 Age-related nuclear cataract, right eye: Secondary | ICD-10-CM | POA: Diagnosis not present

## 2020-04-05 ENCOUNTER — Encounter (INDEPENDENT_AMBULATORY_CARE_PROVIDER_SITE_OTHER): Payer: Self-pay

## 2020-04-17 DIAGNOSIS — H33192 Other retinoschisis and retinal cysts, left eye: Secondary | ICD-10-CM | POA: Diagnosis not present

## 2020-04-17 DIAGNOSIS — H5712 Ocular pain, left eye: Secondary | ICD-10-CM | POA: Diagnosis not present

## 2020-04-17 DIAGNOSIS — H5711 Ocular pain, right eye: Secondary | ICD-10-CM | POA: Diagnosis not present

## 2020-04-17 DIAGNOSIS — H353131 Nonexudative age-related macular degeneration, bilateral, early dry stage: Secondary | ICD-10-CM | POA: Diagnosis not present

## 2020-04-17 DIAGNOSIS — H35342 Macular cyst, hole, or pseudohole, left eye: Secondary | ICD-10-CM | POA: Diagnosis not present

## 2020-04-17 DIAGNOSIS — H35363 Drusen (degenerative) of macula, bilateral: Secondary | ICD-10-CM | POA: Diagnosis not present

## 2020-04-17 DIAGNOSIS — G43101 Migraine with aura, not intractable, with status migrainosus: Secondary | ICD-10-CM | POA: Diagnosis not present

## 2020-04-18 ENCOUNTER — Encounter (INDEPENDENT_AMBULATORY_CARE_PROVIDER_SITE_OTHER): Payer: Self-pay | Admitting: Family Medicine

## 2020-04-18 ENCOUNTER — Ambulatory Visit (INDEPENDENT_AMBULATORY_CARE_PROVIDER_SITE_OTHER): Payer: Medicare HMO | Admitting: Family Medicine

## 2020-04-18 ENCOUNTER — Other Ambulatory Visit: Payer: Self-pay

## 2020-04-18 VITALS — BP 148/73 | HR 71 | Temp 97.9°F | Ht 67.0 in | Wt 255.0 lb

## 2020-04-18 DIAGNOSIS — R0602 Shortness of breath: Secondary | ICD-10-CM | POA: Diagnosis not present

## 2020-04-18 DIAGNOSIS — R7303 Prediabetes: Secondary | ICD-10-CM | POA: Diagnosis not present

## 2020-04-18 DIAGNOSIS — Z6841 Body Mass Index (BMI) 40.0 and over, adult: Secondary | ICD-10-CM | POA: Diagnosis not present

## 2020-04-18 DIAGNOSIS — R69 Illness, unspecified: Secondary | ICD-10-CM | POA: Diagnosis not present

## 2020-04-18 DIAGNOSIS — G4733 Obstructive sleep apnea (adult) (pediatric): Secondary | ICD-10-CM

## 2020-04-18 DIAGNOSIS — E559 Vitamin D deficiency, unspecified: Secondary | ICD-10-CM | POA: Diagnosis not present

## 2020-04-18 DIAGNOSIS — R5383 Other fatigue: Secondary | ICD-10-CM | POA: Diagnosis not present

## 2020-04-18 DIAGNOSIS — I10 Essential (primary) hypertension: Secondary | ICD-10-CM | POA: Diagnosis not present

## 2020-04-18 DIAGNOSIS — F3289 Other specified depressive episodes: Secondary | ICD-10-CM

## 2020-04-18 DIAGNOSIS — R748 Abnormal levels of other serum enzymes: Secondary | ICD-10-CM | POA: Diagnosis not present

## 2020-04-18 DIAGNOSIS — Z0289 Encounter for other administrative examinations: Secondary | ICD-10-CM

## 2020-04-18 NOTE — Progress Notes (Signed)
Dear Dr. Oval Linsey,   Thank you for referring Jolanda Azizi to our clinic. The following note includes my evaluation and treatment recommendations.  Chief Complaint:   OBESITY [name] (MR# [id number]) is a 74 y.o. female who presents for evaluation and treatment of obesity and related comorbidities. Current BMI is Body mass index is 39.94 kg/m. Alane has been struggling with her weight for many years and has been unsuccessful in either losing weight, maintaining weight loss, or reaching her healthy weight goal.  Michayla is currently in the action stage of change and ready to dedicate time achieving and maintaining a healthier weight. Cottie is interested in becoming our patient and working on intensive lifestyle modifications including (but not limited to) diet and exercise for weight loss.  Gianny reports gaining 25 pounds in 1 year.  She says she hates to be weighed.  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  She needs surgery.  She is vegan/vegetarian, pescatarian.  Positive for binge eating behavior.  Klyn's habits were reviewed today and are as follows: her desired weight loss is 75 pounds, she started gaining weight within the last 2 years, her heaviest weight ever is her current weight, she has craves vegetables and deserts, she snacks frequently in the evenings, she skips meals frequently, she is frequently drinking liquids with calories, she frequently eats larger portions than normal and she struggles with emotional eating.  Depression Screen Yaeko's Food and Mood (modified PHQ-9) score was 8.  Depression screen PHQ 2/9 04/18/2020  Decreased Interest 2  Down, Depressed, Hopeless 0  PHQ - 2 Score 2  Altered sleeping 2  Tired, decreased energy 2  Change in appetite 1  Feeling bad or failure about yourself  0  Trouble concentrating 0  Moving slowly or fidgety/restless 1  Suicidal thoughts 0  PHQ-9 Score 8  Difficult doing work/chores  Not difficult at all   Subjective:   1. Other fatigue Solena denies daytime somnolence and admits to waking up still tired. Patent has a history of symptoms of morning fatigue and snoring. Nicle generally gets 6 hours of sleep per night, and states that she has poor quality sleep. Snoring is present. Apneic episodes are not present. Epworth Sleepiness Score is 4.  2. SOB (shortness of breath) on exertion Alenah notes increasing shortness of breath with exercising and seems to be worsening over time with weight gain. She notes getting out of breath sooner with activity than she used to. This has gotten worse recently. Kaitlain denies shortness of breath at rest or orthopnea.  3. Essential hypertension Review: taking medications as instructed, no medication side effects noted, no chest pain on exertion, no dyspnea on exertion, no swelling of ankles.   BP Readings from Last 3 Encounters:  04/18/20 (!) 148/73  03/28/20 138/88  12/28/19 140/78   4. OSA (obstructive sleep apnea) Milca has a diagnosis of sleep apnea. She reports that she is not using a CPAP regularly.   5. Vitamin D deficiency Tyishia's Vitamin D level was 36.3 on 07/15/2019. She is currently taking no vitamin D supplement. She denies nausea, vomiting or muscle weakness.  6. Prediabetes Madellyn has a diagnosis of prediabetes based on her elevated HgA1c and was informed this puts her at greater risk of developing diabetes. She continues to work on diet and exercise to decrease her risk of diabetes. She denies nausea or hypoglycemia.  Lab Results  Component Value Date   HGBA1C 5.7 (H) 07/15/2019   Lab  Results  Component Value Date   INSULIN 15.3 07/15/2019   7. Other depression, with emotional eating Lindsee is struggling with emotional eating and using food for comfort to the extent that it is negatively impacting her health. She has been working on behavior modification techniques to help  reduce her emotional eating and has been unsuccessful. She shows no sign of suicidal or homicidal ideations.  Assessment/Plan:   1. Other fatigue Anwyn does feel that her weight is causing her energy to be lower than it should be. Fatigue may be related to obesity, depression or many other causes. Labs will be ordered, and in the meanwhile, Eleina will focus on self care including making healthy food choices, increasing physical activity and focusing on stress reduction.  Orders - EKG 12-Lead - Anemia panel - TSH - T4, free - T3  2. SOB (shortness of breath) on exertion Amulya does feel that she gets out of breath more easily that she used to when she exercises. Lynda's shortness of breath appears to be obesity related and exercise induced. She has agreed to work on weight loss and gradually increase exercise to treat her exercise induced shortness of breath. Will continue to monitor closely.  Orders - CBC with Differential/Platelet - Lipid panel  3. Essential hypertension Oda is working on healthy weight loss and exercise to improve blood pressure control. We will watch for signs of hypotension as she continues her lifestyle modifications.  4. OSA (obstructive sleep apnea) Intensive lifestyle modifications are the first line treatment for this issue. We discussed several lifestyle modifications today and she will continue to work on diet, exercise and weight loss efforts. We will continue to monitor. Orders and follow up as documented in patient record.   Counseling  Sleep apnea is a condition in which breathing pauses or becomes shallow during sleep. This happens over and over during the night. This disrupts your sleep and keeps your body from getting the rest that it needs, which can cause tiredness and lack of energy (fatigue) during the day.  Sleep apnea treatment: If you were given a device to open your airway while you sleep, USE IT!  Sleep hygiene:     Limit or avoid alcohol, caffeinated beverages, and cigarettes, especially close to bedtime.   Do not eat a large meal or eat spicy foods right before bedtime. This can lead to digestive discomfort that can make it hard for you to sleep.  Keep a sleep diary to help you and your health care provider figure out what could be causing your insomnia.  . Make your bedroom a dark, comfortable place where it is easy to fall asleep. ? Put up shades or blackout curtains to block light from outside. ? Use a white noise machine to block noise. ? Keep the temperature cool. . Limit screen use before bedtime. This includes: ? Watching TV. ? Using your smartphone, tablet, or computer. . Stick to a routine that includes going to bed and waking up at the same times every day and night. This can help you fall asleep faster. Consider making a quiet activity, such as reading, part of your nighttime routine. . Try to avoid taking naps during the day so that you sleep better at night. . Get out of bed if you are still awake after 15 minutes of trying to sleep. Keep the lights down, but try reading or doing a quiet activity. When you feel sleepy, go back to bed.  5. Vitamin D deficiency Low Vitamin  D level contributes to fatigue and are associated with obesity, breast, and colon cancer.  Orders - VITAMIN D 25 Hydroxy (Vit-D Deficiency, Fractures)  6. Prediabetes Alpa will continue to work on weight loss, exercise, and decreasing simple carbohydrates to help decrease the risk of diabetes.   Orders - Comprehensive metabolic panel - Hemoglobin A1c - Insulin, random  7. Other depression, with emotional eating Behavior modification techniques were discussed today to help Raelyne deal with her emotional/non-hunger eating behaviors.  Orders and follow up as documented in patient record.   8. Class 3 severe obesity with serious comorbidity and body mass index (BMI) of 40.0 to 44.9 in adult, unspecified  obesity type (HCC) Anushka is currently in the action stage of change and her goal is to continue with weight loss efforts. I recommend Clotine begin the structured treatment plan as follows:  She has agreed to keeping a food journal and adhering to recommended goals of 1200-1300 calories and 80 grams of protein.  Exercise goals: No exercise has been prescribed at this time.   Behavioral modification strategies: increasing lean protein intake, decreasing simple carbohydrates, increasing vegetables, increasing water intake, decreasing liquid calories, decreasing alcohol intake, decreasing sodium intake and increasing high fiber foods.  She was informed of the importance of frequent follow-up visits to maximize her success with intensive lifestyle modifications for her multiple health conditions. She was informed we would discuss her lab results at her next visit unless there is a critical issue that needs to be addressed sooner. Melisa agreed to keep her next visit at the agreed upon time to discuss these results.  Objective:   Blood pressure (!) 148/73, pulse 71, temperature 97.9 F (36.6 C), temperature source Oral, height 5\' 7"  (1.702 m), weight 255 lb (115.7 kg), SpO2 96 %. Body mass index is 39.94 kg/m.  EKG: Normal sinus rhythm, rate 66 bpm.  Indirect Calorimeter completed today shows a VO2 of 221 and a REE of 1535.  Her calculated basal metabolic rate is [id number] thus her basal metabolic rate is worse than expected.  General: Cooperative, alert, well developed, in no acute distress. HEENT: Conjunctivae and lids unremarkable. Cardiovascular: Regular rhythm.  Lungs: Normal work of breathing. Neurologic: No focal deficits.   Lab Results  Component Value Date   CREATININE 1.03 (H) 12/03/2019   BUN 12 12/03/2019   NA 140 12/03/2019   K 5.0 12/03/2019   CL 101 12/03/2019   CO2 26 12/03/2019   Lab Results  Component Value Date   ALT 13 07/15/2019   AST 18 07/15/2019    ALKPHOS 72 07/15/2019   BILITOT <0.2 07/15/2019   Lab Results  Component Value Date   HGBA1C 5.7 (H) 07/15/2019   HGBA1C 5.4 09/24/2018   Lab Results  Component Value Date   INSULIN 15.3 07/15/2019   Lab Results  Component Value Date   TSH 2.110 06/16/2019   Lab Results  Component Value Date   CHOL 200 (H) 07/15/2019   HDL 67 07/15/2019   LDLCALC 118 (H) 07/15/2019   TRIG 73 07/15/2019   CHOLHDL 3.0 07/15/2019   Lab Results  Component Value Date   WBC 10.1 07/15/2019   HGB 11.6 07/15/2019   HCT 35.1 07/15/2019   MCV 91 07/15/2019   PLT 296 07/15/2019   Obesity Behavioral Intervention:   Approximately 15 minutes were spent on the discussion below.  ASK: We discussed the diagnosis of obesity with Geni Bers today and Jordyne agreed to give [place] permission to discuss obesity behavioral  modification therapy today.  ASSESS: Knyla has the diagnosis of obesity and her BMI today is 40.0. Prem is in the action stage of change.   ADVISE: Alexondra was educated on the multiple health risks of obesity as well as the benefit of weight loss to improve her health. She was advised of the need for long term treatment and the importance of lifestyle modifications to improve her current health and to decrease her risk of future health problems.  AGREE: Multiple dietary modification options and treatment options were discussed and Armentha agreed to follow the recommendations documented in the above note.  ARRANGE: Remel was educated on the importance of frequent visits to treat obesity as outlined per CMS and USPSTF guidelines and agreed to schedule her next follow up appointment today.  Attestation Statements:   This is the patient's first visit at [facility]. The patient's NEW PATIENT PACKET was reviewed at length. Included in the packet: current and past health history, medications, allergies, ROS, gynecologic history (women only), surgical  history, family history, social history, weight history, weight loss surgery history (for those that have had weight loss surgery), nutritional evaluation, mood and food questionnaire, PHQ9, Epworth questionnaire, sleep habits questionnaire, patient life and health improvement goals questionnaire. These will all be scanned into the patient's chart under media.   During the visit, I independently reviewed the patient's EKG, bioimpedance scale results, and indirect calorimeter results. I used this information to tailor a meal plan for the patient that will help her to lose weight and will improve her obesity-related conditions going forward. I performed a medically necessary appropriate examination and/or evaluation. I discussed the assessment and treatment plan with the patient. The patient was provided an opportunity to ask questions and all were answered. The patient agreed with the plan and demonstrated an understanding of the instructions. Labs were ordered at this visit and will be reviewed at the next visit unless more critical results need to be addressed immediately. Clinical information was updated and documented in the EMR.   I, Water quality scientist, CMA, am acting as Location manager for [employer], DO.  I have reviewed the above documentation for accuracy and completeness, and I agree with the above. Briscoe Deutscher, DO

## 2020-04-19 ENCOUNTER — Encounter: Payer: Self-pay | Admitting: Nurse Practitioner

## 2020-04-19 ENCOUNTER — Ambulatory Visit (INDEPENDENT_AMBULATORY_CARE_PROVIDER_SITE_OTHER): Payer: Medicare HMO | Admitting: Nurse Practitioner

## 2020-04-19 VITALS — BP 124/80 | HR 75 | Temp 98.0°F

## 2020-04-19 DIAGNOSIS — R519 Headache, unspecified: Secondary | ICD-10-CM | POA: Diagnosis not present

## 2020-04-19 DIAGNOSIS — H538 Other visual disturbances: Secondary | ICD-10-CM | POA: Diagnosis not present

## 2020-04-19 LAB — CBC WITH DIFFERENTIAL/PLATELET
Basophils Absolute: 0 10*3/uL (ref 0.0–0.2)
Basos: 1 %
EOS (ABSOLUTE): 0.1 10*3/uL (ref 0.0–0.4)
Eos: 1 %
Hemoglobin: 12.2 g/dL (ref 11.1–15.9)
Immature Grans (Abs): 0 10*3/uL (ref 0.0–0.1)
Immature Granulocytes: 0 %
Lymphocytes Absolute: 1.5 10*3/uL (ref 0.7–3.1)
Lymphs: 32 %
MCH: 30.1 pg (ref 26.6–33.0)
MCHC: 32 g/dL (ref 31.5–35.7)
MCV: 94 fL (ref 79–97)
Monocytes Absolute: 0.3 10*3/uL (ref 0.1–0.9)
Monocytes: 7 %
Neutrophils Absolute: 2.8 10*3/uL (ref 1.4–7.0)
Neutrophils: 59 %
Platelets: 276 10*3/uL (ref 150–450)
RBC: 4.05 x10E6/uL (ref 3.77–5.28)
RDW: 12.8 % (ref 11.7–15.4)
WBC: 4.7 10*3/uL (ref 3.4–10.8)

## 2020-04-19 LAB — COMPREHENSIVE METABOLIC PANEL
ALT: 14 IU/L (ref 0–32)
AST: 22 IU/L (ref 0–40)
Albumin/Globulin Ratio: 1.4 (ref 1.2–2.2)
Albumin: 4 g/dL (ref 3.7–4.7)
Alkaline Phosphatase: 82 IU/L (ref 48–121)
BUN/Creatinine Ratio: 21 (ref 12–28)
BUN: 21 mg/dL (ref 8–27)
Bilirubin Total: 0.3 mg/dL (ref 0.0–1.2)
CO2: 24 mmol/L (ref 20–29)
Calcium: 9.4 mg/dL (ref 8.7–10.3)
Chloride: 102 mmol/L (ref 96–106)
Creatinine, Ser: 1.01 mg/dL — ABNORMAL HIGH (ref 0.57–1.00)
GFR calc Af Amer: 64 mL/min/{1.73_m2} (ref >59)
GFR calc non Af Amer: 55 mL/min/{1.73_m2} — ABNORMAL LOW (ref >59)
Globulin, Total: 2.8 g/dL (ref 1.5–4.5)
Glucose: 96 mg/dL (ref 65–99)
Potassium: 4.4 mmol/L (ref 3.5–5.2)
Sodium: 139 mmol/L (ref 134–144)
Total Protein: 6.8 g/dL (ref 6.0–8.5)

## 2020-04-19 LAB — LIPID PANEL
Chol/HDL Ratio: 3.3 ratio (ref 0.0–4.4)
Cholesterol, Total: 201 mg/dL — ABNORMAL HIGH (ref 100–199)
HDL: 61 mg/dL (ref >39)
LDL Chol Calc (NIH): 130 mg/dL — ABNORMAL HIGH (ref 0–99)
Triglycerides: 57 mg/dL (ref 0–149)
VLDL Cholesterol Cal: 10 mg/dL (ref 5–40)

## 2020-04-19 LAB — ANEMIA PANEL
Ferritin: 142 ng/mL (ref 15–150)
Folate, Hemolysate: 326 ng/mL
Folate, RBC: 856 ng/mL (ref >498)
Hematocrit: 38.1 % (ref 34.0–46.6)
Iron Saturation: 18 % (ref 15–55)
Iron: 48 ug/dL (ref 27–139)
Retic Ct Pct: 1.1 % (ref 0.6–2.6)
Total Iron Binding Capacity: 265 ug/dL (ref 250–450)
UIBC: 217 ug/dL (ref 118–369)
Vitamin B-12: 573 pg/mL (ref 232–1245)

## 2020-04-19 LAB — INSULIN, RANDOM: INSULIN: 11.5 u[IU]/mL (ref 2.6–24.9)

## 2020-04-19 LAB — HEMOGLOBIN A1C
Est. average glucose Bld gHb Est-mCnc: 117 mg/dL
Hgb A1c MFr Bld: 5.7 % — ABNORMAL HIGH (ref 4.8–5.6)

## 2020-04-19 LAB — T4, FREE: Free T4: 1.23 ng/dL (ref 0.82–1.77)

## 2020-04-19 LAB — TSH: TSH: 2.46 u[IU]/mL (ref 0.450–4.500)

## 2020-04-19 LAB — T3: T3, Total: 95 ng/dL (ref 71–180)

## 2020-04-19 LAB — VITAMIN D 25 HYDROXY (VIT D DEFICIENCY, FRACTURES): Vit D, 25-Hydroxy: 35.2 ng/mL (ref 30.0–100.0)

## 2020-04-19 MED ORDER — PREDNISONE 20 MG PO TABS: ORAL_TABLET | ORAL | 0 refills | Status: DC

## 2020-04-19 NOTE — Progress Notes (Signed)
This visit occurred during the SARS-CoV-2 public health emergency.  Safety protocols were in place, including screening questions prior to the visit, additional usage of staff PPE, and extensive cleaning of exam room while observing appropriate contact time as indicated for disinfecting solutions.  Subjective:     Patient ID: [name] , female    DOB: 01/25/1946 , 74 y.o.   MRN: [id number]   Chief Complaint  Patient presents with  . vision concerns    patient stated her vision was fine up until monday and then her vision became really cloudy. 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 she has been having some headaches in her eyes. she was told to use some eyedrops for dry eyes.    HPI  She is here today post cataract surgery on May 15th, on Monday she woke up 630-7p vision was good.  By 9am she had cloudy vision, she put eye drops in her eyes. She called Dr. Tommy Rainwater.  Seen by Dr. Posey Pronto who is a retina specialist.  Stated "that she had a macular deformity.", did not feel had any other   Reports on Friday she had a headache - took Tylenol with good relief. On Saturday had a headache but was not intense. Thought was stress related. On Tuesday and Dr. Tommy Rainwater office called advising she has severe dry eye. She was to use the eye drops. This morning when she had the vision issue and had slight headache. Yesterday she is participating in the wellness and weight program. The provider advised her if she has a headache she should go to ER.  She sees Dr. Ronnald Ramp for her ophthalmologist.    Headache  This is a new problem. The current episode started in the past 7 days. The problem occurs intermittently. The pain is located in the frontal (tightness like a band) region. The quality of the pain is described as aching. Pertinent negatives include no abdominal pain, blurred  vision, nausea, phonophobia, photophobia, sinus pressure or vomiting.     Past Medical History:  Diagnosis Date  . Back pain   . Falls   . Fibromyalgia   . Hypertension   . Joint pain   . Joint stiffness   . Joint swelling   . Pure hypercholesterolemia 01/11/2020  . Raynaud's disease    age 60's  . Sleep apnea   . Varicose veins of both lower extremities   . Vitamin B12 deficiency    2020  . Vitamin D deficiency      Family History  Problem Relation Age of Onset  . Hypertension Mother   . Hyperthyroidism Mother   . Hypertension Father   . Prostate cancer Father   . Diabetes Brother   . Breast cancer Paternal Aunt   . Breast cancer Paternal Grandmother   . Peripheral vascular disease Other      Current Outpatient Medications:  .  acetaminophen (TYLENOL) 500 MG tablet, Take 1,000 mg by mouth daily as needed for moderate pain., Disp: , Rfl:  .  Ascorbic Acid (VITAMIN C) 1000 MG tablet, Take 1,000 mg by mouth daily., Disp: , Rfl:  .  ASTRAGALUS PO, Take by mouth., Disp: , Rfl:  .  CALCIUM PO, Take by mouth., Disp: , Rfl:  .  COLLAGEN PO, Take by mouth., Disp: , Rfl:  .  Cyanocobalamin (  VITAMIN B 12 PO), Take by mouth., Disp: , Rfl:  .  diazepam (VALIUM) 2 MG tablet, Take one tab one hour prior to procedure, repeat in one hour if needed, Disp: 2 tablet, Rfl: 0 .  Diclofenac Sodium (PENNSAID) 2 % SOLN, Place onto the skin., Disp: , Rfl:  .  ECHINACEA EXTRACT PO, Take by mouth., Disp: , Rfl:  .  hydrochlorothiazide (HYDRODIURIL) 25 MG tablet, Take 1 tablet (25 mg total) by mouth daily., Disp: 90 tablet, Rfl: 1 .  lisinopril (ZESTRIL) 20 MG tablet, Take 1 tablet (20 mg total) by mouth daily., Disp: 90 tablet, Rfl: 1 .  Magnesium 100 MG CAPS, Take by mouth., Disp: , Rfl:  .  Multiple Vitamin (MULTIVITAMIN) capsule, Take 1 capsule by mouth daily., Disp: , Rfl:  .  Pyridoxine HCl (VITAMIN B6 PO), Take by mouth., Disp: , Rfl:  .  VITAMIN D PO, Take by mouth., Disp: , Rfl:  .   Zinc 30 MG CAPS, Take by mouth., Disp: , Rfl:    No Known Allergies   Review of Systems  HENT: Negative for sinus pressure.   Eyes: Negative for blurred vision and photophobia.  Gastrointestinal: Negative for abdominal pain, nausea and vomiting.  Neurological: Positive for headaches.     Today's Vitals   04/19/20 1640  BP: 124/80  Pulse: 75  Temp: 98 F (36.7 C)  TempSrc: Oral  PainSc: 0-No pain   There is no height or weight on file to calculate BMI.   Objective:  Physical Exam Neurological:     General: No focal deficit present.     Mental Status: She is oriented to person, place, and time.         Assessment And Plan:     1. Acute nonintractable headache, unspecified headache type  She has had headaches previously however this time has some blurred vision as well which can be a sign of migraines  Will treat with a steroid if she continues to have the symptoms - predniSONE (DELTASONE) 20 MG tablet; Take 2 tablets by mouth once a day for 3 days  Dispense: 6 tablet; Refill: 0  2. Blurred vision  Has been evaluated by her ophthalmologist who did not feel was related to her recent surgery  If she is not better will consider additional imaging - predniSONE (DELTASONE) 20 MG tablet; Take 2 tablets by mouth once a day for 3 days  Dispense: 6 tablet; Refill: 0   Minette Brine, FNP    THE PATIENT IS ENCOURAGED TO PRACTICE SOCIAL DISTANCING DUE TO THE COVID-19 PANDEMIC.

## 2020-04-26 DIAGNOSIS — B351 Tinea unguium: Secondary | ICD-10-CM | POA: Diagnosis not present

## 2020-05-02 ENCOUNTER — Ambulatory Visit (INDEPENDENT_AMBULATORY_CARE_PROVIDER_SITE_OTHER): Payer: Medicare HMO | Admitting: Family Medicine

## 2020-05-02 ENCOUNTER — Other Ambulatory Visit: Payer: Self-pay

## 2020-05-02 VITALS — BP 118/73 | HR 65 | Temp 98.0°F | Ht 67.0 in | Wt 243.0 lb

## 2020-05-02 DIAGNOSIS — E559 Vitamin D deficiency, unspecified: Secondary | ICD-10-CM | POA: Diagnosis not present

## 2020-05-02 DIAGNOSIS — Z6838 Body mass index (BMI) 38.0-38.9, adult: Secondary | ICD-10-CM

## 2020-05-02 DIAGNOSIS — R7303 Prediabetes: Secondary | ICD-10-CM

## 2020-05-02 DIAGNOSIS — E7800 Pure hypercholesterolemia, unspecified: Secondary | ICD-10-CM

## 2020-05-02 DIAGNOSIS — G478 Other sleep disorders: Secondary | ICD-10-CM

## 2020-05-02 MED ORDER — TOPIRAMATE 25 MG PO TABS: 25.0000 mg | ORAL_TABLET | Freq: Every day | ORAL | 0 refills | Status: DC

## 2020-05-02 MED ORDER — VITAMIN D (ERGOCALCIFEROL) 1.25 MG (50000 UNIT) PO CAPS: 50000.0000 [IU] | ORAL_CAPSULE | ORAL | 0 refills | Status: DC

## 2020-05-02 NOTE — Progress Notes (Signed)
Chief Complaint:   OBESITY [name] is here to discuss her progress with her obesity treatment plan along with follow-up of her obesity related diagnoses. [name] is on the [place] and states she is following her eating plan approximately 90% of the time. [name] states she is exercising for 0 minutes 0 times per week.  Today's visit was #: 2 Starting weight: 255 lbs Starting date: 04/18/2020 Today's weight: 243 lbs Today's date: 05/02/2020 Total lbs lost to date: 12 lbs Total lbs lost since last in-office visit: 12 lbs  Interim History: [name] is working on getting in 80 grams of protein.  She has been getting 1200 calories and has decreased her ETOH intake.  She says she always drinks water.  She carries Orgain protein.  She is down 6 pounds of water today.  Subjective:   1. Prediabetes [name] has a diagnosis of prediabetes based on her elevated HgA1c and was informed this puts her at greater risk of developing diabetes. She continues to work on diet and exercise to decrease her risk of diabetes. She denies nausea or hypoglycemia.  Lab Results  Component Value Date   HGBA1C 5.7 (H) 04/18/2020   Lab Results  Component Value Date   INSULIN 11.5 04/18/2020   INSULIN 15.3 07/15/2019   2. Hypercholesterolemia [name] has hyperlipidemia and has been trying to improve her cholesterol levels with intensive lifestyle modification including a low saturated fat diet, exercise and weight loss. She denies any chest pain, claudication or myalgias.  Lab Results  Component Value Date   ALT 14 04/18/2020   AST 22 04/18/2020   ALKPHOS 82 04/18/2020   BILITOT 0.3 04/18/2020   Lab Results  Component Value Date   CHOL 201 (H) 04/18/2020   HDL 61 04/18/2020   LDLCALC 130 (H) 04/18/2020   TRIG 57 04/18/2020   CHOLHDL 3.3 04/18/2020   3. Vitamin D deficiency [name]'s Vitamin D level was 35.2 on 04/18/2020. She is currently taking no  vitamin D supplement. She denies nausea, vomiting or muscle weakness.  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  Assessment/Plan:   1. Prediabetes [name] will continue to work on weight loss, exercise, and decreasing simple carbohydrates to help decrease the risk of diabetes.   2. Hypercholesterolemia Cardiovascular risk and specific lipid/LDL goals reviewed.  We discussed several lifestyle modifications today and [name] will continue to work on diet, exercise and weight loss efforts. Orders and follow up as documented in patient record.   Counseling Intensive lifestyle modifications are the first line treatment for this issue. . Dietary changes: Increase soluble fiber. Decrease simple carbohydrates. . Exercise changes: Moderate to vigorous-intensity aerobic activity 150 minutes per week if tolerated. . Lipid-lowering medications: see documented in medical record.  3. Vitamin D deficiency Low Vitamin D level contributes to fatigue and are associated with obesity, breast, and colon cancer. She agrees to start to take prescription Vitamin D @50 ,000 IU every week and will follow-up for routine testing of Vitamin D, at least 2-3 times per year to avoid over-replacement.  Orders - Vitamin D, Ergocalciferol, ([name]) 1.25 MG (50000 UNIT) CAPS capsule; Take 1 capsule (50,000 Units total) by mouth every 7 (seven) days.  Dispense: 4 capsule; Refill: 0  4. Nocturnal sleep-related eating disorder Will start [name] on [name] 25 mg daily, as per below.  Orders - topiramate ([name]) 25 MG tablet; Take 1 tablet (25 mg total) by mouth daily with supper.  Dispense: 30  tablet; Refill: 0  5. Class 2 severe obesity with serious comorbidity and body mass index (BMI) of 38.0 to 38.9 in adult, unspecified obesity type (HCC) [name] is currently in the action stage of change. As such, her goal is to continue with weight loss efforts. She has  agreed to the [name].   Exercise goals: No exercise has been prescribed at this time.  Behavioral modification strategies: increasing lean protein intake and increasing water intake.  [name] has agreed to follow-up with our clinic in 3 weeks. She was informed of the importance of frequent follow-up visits to maximize her success with intensive lifestyle modifications for her multiple health conditions.   Objective:   Blood pressure 118/73, pulse 65, temperature 98 F (36.7 C), temperature source Oral, height 5\' 7"  (1.702 m), weight 243 lb (110.2 kg), SpO2 99 %. Body mass index is 38.06 kg/m.  General: Cooperative, alert, well developed, in no acute distress. HEENT: Conjunctivae and lids unremarkable. Cardiovascular: Regular rhythm.  Lungs: Normal work of breathing. Neurologic: No focal deficits.   Lab Results  Component Value Date   CREATININE 1.01 (H) 04/18/2020   BUN 21 04/18/2020   NA 139 04/18/2020   K 4.4 04/18/2020   CL 102 04/18/2020   CO2 24 04/18/2020   Lab Results  Component Value Date   ALT 14 04/18/2020   AST 22 04/18/2020   ALKPHOS 82 04/18/2020   BILITOT 0.3 04/18/2020   Lab Results  Component Value Date   HGBA1C 5.7 (H) 04/18/2020   HGBA1C 5.7 (H) 07/15/2019   HGBA1C 5.4 09/24/2018   Lab Results  Component Value Date   INSULIN 11.5 04/18/2020   INSULIN 15.3 07/15/2019   Lab Results  Component Value Date   TSH 2.460 04/18/2020   Lab Results  Component Value Date   CHOL 201 (H) 04/18/2020   HDL 61 04/18/2020   LDLCALC 130 (H) 04/18/2020   TRIG 57 04/18/2020   CHOLHDL 3.3 04/18/2020   Lab Results  Component Value Date   WBC 4.7 04/18/2020   HGB 12.2 04/18/2020   HCT 38.1 04/18/2020   MCV 94 04/18/2020   PLT 276 04/18/2020   Lab Results  Component Value Date   IRON 48 04/18/2020   TIBC 265 04/18/2020   FERRITIN 142 04/18/2020   Obesity Behavioral Intervention:   Approximately 15 minutes were spent on the discussion  below.  ASK: We discussed the diagnosis of obesity with [name] today and [name] agreed to give [place] permission to discuss obesity behavioral modification therapy today.  ASSESS: [name] has the diagnosis of obesity and her BMI today is 38.1. [name] is in the action stage of change.   ADVISE: [name] was educated on the multiple health risks of obesity as well as the benefit of weight loss to improve her health. She was advised of the need for long term treatment and the importance of lifestyle modifications to improve her current health and to decrease her risk of future health problems.  AGREE: Multiple dietary modification options and treatment options were discussed and [name] agreed to follow the recommendations documented in the above note.  ARRANGE: [name] was educated on the importance of frequent visits to treat obesity as outlined per CMS and USPSTF guidelines and agreed to schedule her next follow up appointment today.  Attestation Statements:   Reviewed by clinician on day of visit: allergies, medications, problem list, medical history, surgical history, family history, social history, and previous encounter notes.  I, Water quality scientist, CMA, am  acting as transcriptionist for Briscoe Deutscher, DO  I have reviewed the above documentation for accuracy and completeness, and I agree with the above. Briscoe Deutscher, DO

## 2020-05-08 ENCOUNTER — Encounter (INDEPENDENT_AMBULATORY_CARE_PROVIDER_SITE_OTHER): Payer: Self-pay | Admitting: Family Medicine

## 2020-05-09 DIAGNOSIS — H43813 Vitreous degeneration, bilateral: Secondary | ICD-10-CM | POA: Diagnosis not present

## 2020-05-17 ENCOUNTER — Ambulatory Visit (INDEPENDENT_AMBULATORY_CARE_PROVIDER_SITE_OTHER): Payer: Medicare HMO

## 2020-05-17 ENCOUNTER — Ambulatory Visit: Payer: Medicare HMO | Admitting: Podiatry

## 2020-05-17 ENCOUNTER — Other Ambulatory Visit: Payer: Self-pay

## 2020-05-17 DIAGNOSIS — M79672 Pain in left foot: Secondary | ICD-10-CM

## 2020-05-17 DIAGNOSIS — M898X9 Other specified disorders of bone, unspecified site: Secondary | ICD-10-CM

## 2020-05-17 DIAGNOSIS — L603 Nail dystrophy: Secondary | ICD-10-CM | POA: Diagnosis not present

## 2020-05-17 DIAGNOSIS — B351 Tinea unguium: Secondary | ICD-10-CM

## 2020-05-17 DIAGNOSIS — L608 Other nail disorders: Secondary | ICD-10-CM | POA: Diagnosis not present

## 2020-05-17 DIAGNOSIS — L814 Other melanin hyperpigmentation: Secondary | ICD-10-CM | POA: Diagnosis not present

## 2020-05-17 DIAGNOSIS — M2041 Other hammer toe(s) (acquired), right foot: Secondary | ICD-10-CM

## 2020-05-17 DIAGNOSIS — M2042 Other hammer toe(s) (acquired), left foot: Secondary | ICD-10-CM

## 2020-05-17 DIAGNOSIS — M79671 Pain in right foot: Secondary | ICD-10-CM

## 2020-05-17 NOTE — Addendum Note (Signed)
Addended by: Frazier Butt H on: 05/17/2020 01:44 PM   Modules accepted: Orders

## 2020-05-17 NOTE — Progress Notes (Signed)
HPI: 74 y.o. female presenting today as a new patient referral from Dr. Gershon Mussel, local podiatrist, for evaluation of dystrophic nails to the bilateral great toes as well as symptomatic callus lesions to the fifth digit bilaterally. Patient states that she has had dystrophic discolored nails that peeled off and are thick for the past several years.  She has been treated in the past for fungus with oral medication without any improvement.  She was also informed that she has subungual exostosis/bone spurs underlying the nail plates that may be contributing to the dystrophy of the nail. Patient also complains of symptomatic callus lesions overlying the fifth digits bilaterally.  She does have some curvature of the toes.  They are very symptomatic even with open toed sandals.  She has tried different lotions and modified her shoe gear.  With close toed shoes she is unable to ambulate without pain.  She presents for further treatment evaluation  Past Medical History:  Diagnosis Date  . Back pain   . Falls   . Fibromyalgia   . Hypertension   . Joint pain   . Joint stiffness   . Joint swelling   . Pure hypercholesterolemia 01/11/2020  . Raynaud's disease    age 59's  . Sleep apnea   . Varicose veins of both lower extremities   . Vitamin B12 deficiency    2020  . Vitamin D deficiency      Physical Exam: General: The patient is alert and oriented x3 in no acute distress.  Dermatology: Skin is warm, dry and supple bilateral lower extremities. Negative for open lesions or macerations.  Hyperkeratotic callus lesions noted overlying the fifth digits bilaterally at the level of the PIPJ with associated hammertoe contracture  Dystrophic discolored thickened nails noted to the bilateral great toes.  Vascular: Palpable pedal pulses bilaterally. No edema or erythema noted. Capillary refill within normal limits.  Neurological: Epicritic and protective threshold grossly intact bilaterally.    Musculoskeletal Exam: Range of motion within normal limits to all pedal and ankle joints bilateral. Muscle strength 5/5 in all groups bilateral.  Adductovarus hammertoe deformity noted to the fifth digits bilaterally with an overlying callus lesion  Radiographic Exam:  There is a very small subungual exostosis noted to the bilateral great toes visualized on lateral view.  I do not suspect that this would be contributory to the dystrophic nail plates based on clinical exam. Hammertoe contracture also noted to the fifth digits bilaterally  Assessment: 1.  Hammertoe fifth digits bilaterally.  Left greater than right 2.  Dystrophic nails bilateral great toes   Plan of Care:  1. Patient evaluated. X-Rays reviewed.  2.  Nail biopsy performed of the bilateral great toes and sent to pathology separately using a nail nipper without incident or bleeding 3.  Today we discussed treatment options associated to the fifth digit hammertoes bilaterally.  Both conservative and surgical options were discussed.  Patient would like to have the painful hammertoes permanently fixed.  All possible complications and details the procedure were explained.  No guarantees were expressed or implied.  All patient questions answered.  I do believe this is a viable option for the patient. 4.  Authorization for surgery initiated today.  Surgery will consist of PIPJ arthroplasty with derotational skin plasty fifth digit left foot.  Surgery will be performed in office.  2 weeks following the first foot we will proceed with right foot arthroplasty of the fifth digit. 5.  Return to clinic morning of in office  surgery      Edrick Kins, DPM [facility]  Dr. Edrick Kins, DPM    [address], [place] [address]                Office [phone]  Fax [phone]

## 2020-05-18 ENCOUNTER — Telehealth: Payer: Self-pay

## 2020-05-18 NOTE — Telephone Encounter (Signed)
DOS 05/24/20  HAMMERTOE REPAIR LT - [id number]  [employer] MEDICARE EFFECTIVE DATE - 11/18/2017  PLAN DEDUCTIBLE - $0.00 OUT OF POCKET - $5000.00 W/ $4235.77 REMAINING COPAY $25.00 COINSURANCE - 100%  PER AUTOMATED SYSTEM NO PRECERT REQUIRED FOR CPT 28285.  CALL REF # - [id number]

## 2020-05-24 ENCOUNTER — Ambulatory Visit (INDEPENDENT_AMBULATORY_CARE_PROVIDER_SITE_OTHER): Payer: Medicare HMO | Admitting: Family Medicine

## 2020-05-24 ENCOUNTER — Ambulatory Visit: Payer: Medicare HMO | Admitting: Podiatry

## 2020-05-25 ENCOUNTER — Other Ambulatory Visit: Payer: Self-pay | Admitting: Podiatry

## 2020-05-25 DIAGNOSIS — M2042 Other hammer toe(s) (acquired), left foot: Secondary | ICD-10-CM

## 2020-05-25 DIAGNOSIS — M2041 Other hammer toe(s) (acquired), right foot: Secondary | ICD-10-CM

## 2020-05-29 ENCOUNTER — Other Ambulatory Visit: Payer: Self-pay

## 2020-05-29 ENCOUNTER — Encounter (INDEPENDENT_AMBULATORY_CARE_PROVIDER_SITE_OTHER): Payer: Self-pay | Admitting: Adult Health

## 2020-05-29 ENCOUNTER — Ambulatory Visit (INDEPENDENT_AMBULATORY_CARE_PROVIDER_SITE_OTHER): Payer: Medicare HMO | Admitting: Adult Health

## 2020-05-29 VITALS — BP 118/72 | HR 97 | Temp 97.7°F | Ht 67.0 in | Wt 240.0 lb

## 2020-05-29 DIAGNOSIS — I10 Essential (primary) hypertension: Secondary | ICD-10-CM

## 2020-05-29 DIAGNOSIS — Z6837 Body mass index (BMI) 37.0-37.9, adult: Secondary | ICD-10-CM | POA: Diagnosis not present

## 2020-05-29 DIAGNOSIS — G478 Other sleep disorders: Secondary | ICD-10-CM | POA: Diagnosis not present

## 2020-05-29 DIAGNOSIS — E559 Vitamin D deficiency, unspecified: Secondary | ICD-10-CM

## 2020-05-29 DIAGNOSIS — E66812 Obesity, class 2: Secondary | ICD-10-CM

## 2020-05-29 MED ORDER — VITAMIN D (ERGOCALCIFEROL) 1.25 MG (50000 UNIT) PO CAPS: 50000.0000 [IU] | ORAL_CAPSULE | ORAL | 0 refills | Status: DC

## 2020-05-29 MED ORDER — TOPIRAMATE 25 MG PO TABS: 25.0000 mg | ORAL_TABLET | Freq: Every day | ORAL | 0 refills | Status: DC

## 2020-05-29 NOTE — Progress Notes (Signed)
Chief Complaint:   OBESITY [name] is here to discuss her progress with her obesity treatment plan along with follow-up of her obesity related diagnoses. [name] is on the [employer] and states she is following her eating plan approximately 85% of the time. [name] states she is exercising 0 minutes 0 times per week.  Today's visit was #: 3 Starting weight: 255 lbs Starting date: 04/18/2020 Today's weight: 240 lbs Today's date: 05/29/2020 Total lbs lost to date: 15 Total lbs lost since last in-office visit: 3  Interim History: [name] has had company the last 3 weeks. She has been focusing on intermittent fasting and then will break her fast with a protein based meal. She has been enjoying ETOH sparingly and has increased her daily water intake. She continues to experience visual disturbances and will be seeking a second opinion-diagnosis and treatment options.  Subjective:   Vitamin D deficiency. [name] is on prescription strength Vitamin D supplementation. No nausea, vomiting, or muscle weakness.    Ref. Range 04/18/2020 12:37  Vitamin D, 25-Hydroxy Latest Ref Range: 30.0 - 100.0 ng/mL 35.2   Essential hypertension. Blood pressure is well controlled on today's office visit. [name] is on HCTZ 25 mg daily and lisinopril 20 mg daily.  BP Readings from Last 3 Encounters:  05/29/20 118/72  05/02/20 118/73  04/19/20 124/80   Lab Results  Component Value Date   CREATININE 1.01 (H) 04/18/2020   CREATININE 1.03 (H) 12/03/2019   CREATININE 1.15 (H) 07/15/2019   Nocturnal sleep-related eating disorder. [name] has been taking topiramate 25 mg QHS most days of the week. She denies history of kidney stones. She has experienced intermittent cravings.  Assessment/Plan:   Vitamin D deficiency.  Low Vitamin D level contributes to fatigue and are associated with obesity, breast, and colon cancer. She was given a refill on her Vitamin D, Ergocalciferol,  (DRISDOL) 1.25 MG (50000 UNIT) CAPS capsule every week #4 with 0 refills and will follow-up for routine testing of Vitamin D every 3 months.  Essential hypertension. [name] is working on healthy weight loss and exercise to improve blood pressure control. We will watch for signs of hypotension as she continues her lifestyle modifications. She will continue her current anti-hypertensive regimen.  Nocturnal sleep-related eating disorder. Refill was given for topiramate (TOPAMAX) 25 MG tablet QHS #30 with 0 refills. Encouraged to take consistently.   Class 2 severe obesity with serious comorbidity and body mass index (BMI) of 37.0 to 37.9 in adult, unspecified obesity type ([place]).  [name] is currently in the action stage of change. As such, her goal is to continue with weight loss efforts. She has agreed to the [employer].   Exercise goals: No exercise has been prescribed at this time.  Behavioral modification strategies: increasing lean protein intake, decreasing simple carbohydrates, increasing water intake, meal planning and cooking strategies and planning for success.  [name] has agreed to follow-up with our clinic in 2 weeks. She was informed of the importance of frequent follow-up visits to maximize her success with intensive lifestyle modifications for her multiple health conditions.   Objective:   Blood pressure 118/72, pulse 97, temperature 97.7 F (36.5 C), temperature source Oral, height 5\' 7"  (1.702 m), weight 240 lb (108.9 kg), SpO2 97 %. Body mass index is 37.59 kg/m.  General: Cooperative, alert, well developed, in no acute distress. HEENT: Conjunctivae and lids unremarkable. Cardiovascular: Regular rhythm.  Lungs: Normal work of breathing. Neurologic: No focal deficits.   Lab Results  Component Value Date   CREATININE 1.01 (H) 04/18/2020   BUN 21 04/18/2020   NA 139 04/18/2020   K 4.4 04/18/2020   CL 102 04/18/2020   CO2 24 04/18/2020   Lab Results    Component Value Date   ALT 14 04/18/2020   AST 22 04/18/2020   ALKPHOS 82 04/18/2020   BILITOT 0.3 04/18/2020   Lab Results  Component Value Date   HGBA1C 5.7 (H) 04/18/2020   HGBA1C 5.7 (H) 07/15/2019   HGBA1C 5.4 09/24/2018   Lab Results  Component Value Date   INSULIN 11.5 04/18/2020   INSULIN 15.3 07/15/2019   Lab Results  Component Value Date   TSH 2.460 04/18/2020   Lab Results  Component Value Date   CHOL 201 (H) 04/18/2020   HDL 61 04/18/2020   LDLCALC 130 (H) 04/18/2020   TRIG 57 04/18/2020   CHOLHDL 3.3 04/18/2020   Lab Results  Component Value Date   WBC 4.7 04/18/2020   HGB 12.2 04/18/2020   HCT 38.1 04/18/2020   MCV 94 04/18/2020   PLT 276 04/18/2020   Lab Results  Component Value Date   IRON 48 04/18/2020   TIBC 265 04/18/2020   FERRITIN 142 04/18/2020   Obesity Behavioral Intervention Documentation for Insurance:   Approximately 15 minutes were spent on the discussion below.  ASK: We discussed the diagnosis of obesity with [name] today and [name] agreed to give [place] permission to discuss obesity behavioral modification therapy today.  ASSESS: [name] has the diagnosis of obesity and her BMI today is 37.6. [name] is in the action stage of change.   ADVISE: [name] was educated on the multiple health risks of obesity as well as the benefit of weight loss to improve her health. She was advised of the need for long term treatment and the importance of lifestyle modifications to improve her current health and to decrease her risk of future health problems.  AGREE: Multiple dietary modification options and treatment options were discussed and [name] agreed to follow the recommendations documented in the above note.  ARRANGE: [name] was educated on the importance of frequent visits to treat obesity as outlined per CMS and USPSTF guidelines and agreed to schedule her next follow up appointment today.  Attestation  Statements:   Reviewed by clinician on day of visit: allergies, medications, problem list, medical history, surgical history, family history, social history, and previous encounter notes.  I, Michaelene Song, am acting as Location manager for [employer], NP-C   I have reviewed the above documentation for accuracy and completeness, and I agree with the above. -  Esaw Grandchild, NP

## 2020-05-31 ENCOUNTER — Ambulatory Visit: Payer: Medicare HMO | Admitting: Podiatry

## 2020-05-31 ENCOUNTER — Encounter: Payer: Self-pay | Admitting: Podiatry

## 2020-05-31 ENCOUNTER — Encounter: Payer: Medicare HMO | Admitting: Podiatry

## 2020-05-31 ENCOUNTER — Other Ambulatory Visit: Payer: Self-pay

## 2020-05-31 DIAGNOSIS — Z79899 Other long term (current) drug therapy: Secondary | ICD-10-CM

## 2020-05-31 DIAGNOSIS — M2041 Other hammer toe(s) (acquired), right foot: Secondary | ICD-10-CM | POA: Diagnosis not present

## 2020-05-31 DIAGNOSIS — M2042 Other hammer toe(s) (acquired), left foot: Secondary | ICD-10-CM | POA: Diagnosis not present

## 2020-05-31 NOTE — Addendum Note (Signed)
Addended by: Cranford Mon R on: 05/31/2020 08:43 AM   Modules accepted: Orders

## 2020-05-31 NOTE — Addendum Note (Signed)
Addended by: Cranford Mon R on: 05/31/2020 08:49 AM   Modules accepted: Orders

## 2020-05-31 NOTE — Progress Notes (Signed)
   OPERATIVE REPORT Patient name: [name] MRN: [id number] DOB: Nov 26, 1945  DOS:  05/31/20  Preop Dx: Garner Nash fifth digit left foot Postop Dx: same  Procedure:  1.  PIPJ arthroplasty with derotational skin plasty fifth digit left foot  Surgeon: Edrick Kins DPM  Anesthesia: 50-50 mixture of 2% lidocaine plain with 0.5% Marcaine plain totaling 3 mL infiltrated in the patient's left digital block fashion  Hemostasis: None  EBL: Minimal mL Materials: None Injectables: None Pathology: None  Condition: The patient tolerated the procedure and anesthesia well. No complications noted or reported   Justification for procedure: The patient is a 74 y.o. female who presents today for surgical correction of a symptomatic hammertoe to the fifth digit left foot. All conservative modalities of been unsuccessful in providing any sort of satisfactory alleviation of symptoms with the patient. The patient was told benefits as well as possible side effects of the surgery. The patient consented for surgical correction. The patient consent form was reviewed. All patient questions were answered. No guarantees were expressed or implied.   Procedure in Detail: The patient was brought to the procedure room, placed in the procedure chair in the supine position at which time an aseptic scrub and drape were performed about the patient's respective lower extremity after anesthesia was induced as described above. Attention was then directed to the surgical area where procedure number one commenced.  Procedure #1: PIPJ arthroplasty with derotational skin plasty fifth digit left foot A 1.5 cm elliptical skin incision was planned and made overlying the PIPJ of the fifth digit left foot and a distal medial to proximal lateral orientation.  The ellipse skin wedge was removed to expose the underlying EDL tendon.  A transverse tenotomy was performed to expose the underlying hypertrophic head of the proximal  phalanx.  Soft tissue dissection was utilized to reflect away the capsular and soft tissue around the hypertrophic head of the proximal phalanx and the head of the proximal phalanx was removed using a bone cutter at the surgical phalangeal neck of the proximal phalanx.  Irrigation was utilized in preparation for routine layered primary closure.  4-0 Vicryl suture was utilized to reapproximate the opposing ends of the EDL tendon under normal physiologic tension followed by 4-0 nylon to reapproximate superficial skin edges  Dry sterile compressive dressings were then applied to all previously mentioned incision sites about the patient's lower extremity. The tourniquet which was used for hemostasis was deflated. All normal neurovascular responses including pink color and warmth returned all the digits of patient's lower extremity.  The patient was then discharged from the office with adequate prescriptions for analgesia. Verbal as well as written instructions were provided for the patient regarding wound care. The patient is to keep the dressings clean dry and intact until they are to follow up in office next week.  Postoperative shoe dispensed.  Weightbearing as tolerated.  Edrick Kins, DPM [facility]  Dr. Edrick Kins, [address]                                        [place], [place] [address]                Office [phone]  Fax [phone]

## 2020-06-07 ENCOUNTER — Ambulatory Visit (INDEPENDENT_AMBULATORY_CARE_PROVIDER_SITE_OTHER): Payer: Medicare HMO

## 2020-06-07 ENCOUNTER — Ambulatory Visit (INDEPENDENT_AMBULATORY_CARE_PROVIDER_SITE_OTHER): Payer: Medicare HMO | Admitting: Podiatry

## 2020-06-07 ENCOUNTER — Other Ambulatory Visit: Payer: Self-pay

## 2020-06-07 DIAGNOSIS — Z9889 Other specified postprocedural states: Secondary | ICD-10-CM

## 2020-06-07 DIAGNOSIS — M2041 Other hammer toe(s) (acquired), right foot: Secondary | ICD-10-CM

## 2020-06-07 DIAGNOSIS — M2042 Other hammer toe(s) (acquired), left foot: Secondary | ICD-10-CM

## 2020-06-07 NOTE — Progress Notes (Signed)
   Subjective:  Patient presents today status post fifth toe arthroplasty left foot. DOS: 05/31/2020.  Patient states she is doing well she does have some minimal throbbing to the area.  She has been weightbearing in the postsurgical shoe as directed.  No new complaints at this time  Past Medical History:  Diagnosis Date  . Back pain   . Falls   . Fibromyalgia   . Hypertension   . Joint pain   . Joint stiffness   . Joint swelling   . Pure hypercholesterolemia 01/11/2020  . Raynaud's disease    age 74's  . Sleep apnea   . Varicose veins of both lower extremities   . Vitamin B12 deficiency    2020  . Vitamin D deficiency       Objective/Physical Exam Neurovascular status intact.  Skin incisions appear to be well coapted with sutures  intact. No sign of infectious process noted. No dehiscence. No active bleeding noted. Moderate edema noted to the surgical toe  Radiographic Exam:  Osteotomies sites appear to be stable with routine healing.  Absence of the hypertrophic head of the proximal phalanx noted at the surgical phalangeal neck  Assessment: 1. s/p fifth toe arthroplasty left foot. DOS: 05/31/2020   Plan of Care:  1. Patient was evaluated. X-rays reviewed 2.  Patient may begin to get the foot wet.  Recommend antibiotic ointment and Band-Aid daily 3.  Continue postoperative shoe or open toe sandals 4.  Return to clinic in 1 week for suture removal.   Edrick Kins, DPM [facility]  Dr. Edrick Kins, [address]                                        [place], [place] [address]                Office [phone]  Fax [phone]

## 2020-06-13 ENCOUNTER — Encounter (INDEPENDENT_AMBULATORY_CARE_PROVIDER_SITE_OTHER): Payer: Self-pay

## 2020-06-13 ENCOUNTER — Encounter: Payer: Self-pay | Admitting: Podiatry

## 2020-06-13 ENCOUNTER — Other Ambulatory Visit: Payer: Self-pay

## 2020-06-13 ENCOUNTER — Ambulatory Visit (INDEPENDENT_AMBULATORY_CARE_PROVIDER_SITE_OTHER): Payer: Medicare HMO | Admitting: Podiatry

## 2020-06-13 VITALS — Temp 97.6°F

## 2020-06-13 DIAGNOSIS — Z9889 Other specified postprocedural states: Secondary | ICD-10-CM

## 2020-06-13 DIAGNOSIS — M2041 Other hammer toe(s) (acquired), right foot: Secondary | ICD-10-CM

## 2020-06-13 DIAGNOSIS — M2042 Other hammer toe(s) (acquired), left foot: Secondary | ICD-10-CM

## 2020-06-13 NOTE — Patient Instructions (Signed)
Mederma is a silicone gel or pad that can help to heal scars and keep them thin.

## 2020-06-13 NOTE — Progress Notes (Signed)
  Subjective:  Patient ID: [name], female    DOB: 1946-07-08,  MRN: [id number]  Chief Complaint  Patient presents with  . Routine Post Op    SUTURE REMOVAL; DOS 05/31/20 HAMMERTOE REPAIR 5TH LT; pt stated, "Pain 4/10 usually at night time; sometimes toe throbs; wants to know when swelling will go down"    DOS: 05/31/2020 Procedure: Left fifth hammertoe arthroplasty  74 y.o. female returns for post-op check.  Feeling well.  Some pain.  Still swollen  Review of Systems: Negative except as noted in the HPI. Denies N/V/F/Ch.  Objective:   Vitals:   06/13/20 0947  Temp: 97.6 F (36.4 C)   There is no height or weight on file to calculate BMI. Constitutional Well developed. Well nourished.  Vascular Foot warm and well perfused. Capillary refill normal to all digits.   Neurologic Normal speech. Oriented to person, place, and time. Epicritic sensation to light touch grossly present bilaterally.  Dermatologic Skin well healed without signs of infection. Skin edges well coapted without signs of infection.  Non-hypertrophic scar  Orthopedic: Tenderness to palpation noted about the surgical site.    Assessment:   1. Hammertoes of both feet   2. Status post left foot surgery    Plan:  Patient was evaluated and treated and all questions answered.  S/p foot surgery left fifth hammertoe correction -Progressing as expected post-operatively. -WB Status: WBAT in open shoes such as sandals, she may wear sneakers if this is comfortable -Sutures: Removed today -Okay to begin bathing. -Advised that silicone gel application can help thin scars.  Return in about 2 weeks (around 06/27/2020) for with Dr Amalia Hailey.

## 2020-06-14 ENCOUNTER — Ambulatory Visit (INDEPENDENT_AMBULATORY_CARE_PROVIDER_SITE_OTHER): Payer: Medicare HMO | Admitting: Family Medicine

## 2020-06-21 ENCOUNTER — Ambulatory Visit (INDEPENDENT_AMBULATORY_CARE_PROVIDER_SITE_OTHER): Payer: Medicare HMO | Admitting: Family Medicine

## 2020-06-26 DIAGNOSIS — H35411 Lattice degeneration of retina, right eye: Secondary | ICD-10-CM | POA: Diagnosis not present

## 2020-06-26 DIAGNOSIS — H353131 Nonexudative age-related macular degeneration, bilateral, early dry stage: Secondary | ICD-10-CM | POA: Diagnosis not present

## 2020-06-26 DIAGNOSIS — H35371 Puckering of macula, right eye: Secondary | ICD-10-CM | POA: Diagnosis not present

## 2020-06-26 DIAGNOSIS — Z961 Presence of intraocular lens: Secondary | ICD-10-CM | POA: Diagnosis not present

## 2020-06-26 DIAGNOSIS — H35363 Drusen (degenerative) of macula, bilateral: Secondary | ICD-10-CM | POA: Diagnosis not present

## 2020-06-28 ENCOUNTER — Ambulatory Visit (INDEPENDENT_AMBULATORY_CARE_PROVIDER_SITE_OTHER): Payer: Medicare HMO | Admitting: Podiatry

## 2020-06-28 ENCOUNTER — Other Ambulatory Visit: Payer: Self-pay

## 2020-06-28 ENCOUNTER — Ambulatory Visit (INDEPENDENT_AMBULATORY_CARE_PROVIDER_SITE_OTHER): Payer: Medicare HMO

## 2020-06-28 DIAGNOSIS — M2042 Other hammer toe(s) (acquired), left foot: Secondary | ICD-10-CM

## 2020-06-28 DIAGNOSIS — Z9889 Other specified postprocedural states: Secondary | ICD-10-CM

## 2020-06-28 DIAGNOSIS — M2041 Other hammer toe(s) (acquired), right foot: Secondary | ICD-10-CM

## 2020-06-28 MED ORDER — TERBINAFINE HCL 250 MG PO TABS: 250.0000 mg | ORAL_TABLET | Freq: Every day | ORAL | 0 refills | Status: DC

## 2020-06-28 NOTE — Progress Notes (Signed)
   Subjective:  Patient presents today status post fifth toe arthroplasty left foot. DOS: 05/31/2020.  Patient has noticed significant improvement since last visit.  She says the swelling in the sensitivity has decreased.  She does have some intermittent sensitivity to the toe but otherwise doing well She also states that she was supposed to be is prescribed some antifungal medication for her onychomycosis to her left hallux nail plate.  She never received the prescription.  She is requesting a prescription.  She received blood work on 04/18/2020 within normal limits.  Past Medical History:  Diagnosis Date  . Back pain   . Falls   . Fibromyalgia   . Hypertension   . Joint pain   . Joint stiffness   . Joint swelling   . Pure hypercholesterolemia 01/11/2020  . Raynaud's disease    age 74's  . Sleep apnea   . Varicose veins of both lower extremities   . Vitamin B12 deficiency    2020  . Vitamin D deficiency       Objective/Physical Exam Neurovascular status intact.  Skin incisions appear to be well coapted with sutures  intact. No sign of infectious process noted. No dehiscence. No active bleeding noted. Moderate edema noted to the surgical toe  Radiographic Exam:  Osteotomies sites appear to be stable with routine healing.  Absence of the hypertrophic head of the proximal phalanx noted at the surgical phalangeal neck  Assessment: 1. s/p fifth toe arthroplasty left foot. DOS: 05/31/2020 2.  Onychomycosis of toenail left hallux   Plan of Care:  1. Patient was evaluated. X-rays reviewed 2.  Recommend good supportive shoes 3.  Prescription for Lamisil 250 mg #90 daily 4.  Patient may resume full activity no restrictions 5.  Return to clinic as needed   Edrick Kins, DPM [facility]  Dr. Edrick Kins, [address]                                        [place], [place] [address]                Office [phone]  Fax [phone]

## 2020-07-20 ENCOUNTER — Other Ambulatory Visit: Payer: Self-pay

## 2020-07-20 ENCOUNTER — Ambulatory Visit (INDEPENDENT_AMBULATORY_CARE_PROVIDER_SITE_OTHER): Payer: Medicare HMO

## 2020-07-20 ENCOUNTER — Other Ambulatory Visit: Payer: Self-pay | Admitting: Nurse Practitioner

## 2020-07-20 ENCOUNTER — Other Ambulatory Visit: Payer: Self-pay | Admitting: Internal Medicine

## 2020-07-20 ENCOUNTER — Encounter: Payer: Self-pay | Admitting: Internal Medicine

## 2020-07-20 ENCOUNTER — Ambulatory Visit (INDEPENDENT_AMBULATORY_CARE_PROVIDER_SITE_OTHER): Payer: Medicare HMO | Admitting: Internal Medicine

## 2020-07-20 VITALS — BP 140/82 | HR 72 | Temp 97.9°F

## 2020-07-20 DIAGNOSIS — Z23 Encounter for immunization: Secondary | ICD-10-CM | POA: Diagnosis not present

## 2020-07-20 DIAGNOSIS — I129 Hypertensive chronic kidney disease with stage 1 through stage 4 chronic kidney disease, or unspecified chronic kidney disease: Secondary | ICD-10-CM | POA: Diagnosis not present

## 2020-07-20 DIAGNOSIS — Z79899 Other long term (current) drug therapy: Secondary | ICD-10-CM

## 2020-07-20 DIAGNOSIS — E7800 Pure hypercholesterolemia, unspecified: Secondary | ICD-10-CM | POA: Diagnosis not present

## 2020-07-20 DIAGNOSIS — N182 Chronic kidney disease, stage 2 (mild): Secondary | ICD-10-CM | POA: Diagnosis not present

## 2020-07-20 DIAGNOSIS — Z1231 Encounter for screening mammogram for malignant neoplasm of breast: Secondary | ICD-10-CM

## 2020-07-20 DIAGNOSIS — Z0000 Encounter for general adult medical examination without abnormal findings: Secondary | ICD-10-CM

## 2020-07-20 DIAGNOSIS — R7309 Other abnormal glucose: Secondary | ICD-10-CM | POA: Diagnosis not present

## 2020-07-20 DIAGNOSIS — E559 Vitamin D deficiency, unspecified: Secondary | ICD-10-CM | POA: Diagnosis not present

## 2020-07-20 DIAGNOSIS — I10 Essential (primary) hypertension: Secondary | ICD-10-CM

## 2020-07-20 LAB — POCT URINALYSIS DIPSTICK
Bilirubin, UA: NEGATIVE
Blood, UA: NEGATIVE
Glucose, UA: NEGATIVE
Ketones, UA: NEGATIVE
Leukocytes, UA: NEGATIVE
Nitrite, UA: NEGATIVE
Protein, UA: NEGATIVE
Spec Grav, UA: 1.025 (ref 1.010–1.025)
Urobilinogen, UA: 0.2 E.U./dL
pH, UA: 7 (ref 5.0–8.0)

## 2020-07-20 LAB — POCT UA - MICROALBUMIN
Albumin/Creatinine Ratio, Urine, POC: <30
Creatinine, POC: 200 mg/dL
Microalbumin Ur, POC: 10 mg/L

## 2020-07-20 MED ORDER — VITAMIN D (ERGOCALCIFEROL) 1.25 MG (50000 UNIT) PO CAPS: 50000.0000 [IU] | ORAL_CAPSULE | ORAL | 1 refills | Status: DC

## 2020-07-20 NOTE — Patient Instructions (Addendum)
Health Maintenance, Female Adopting a healthy lifestyle and getting preventive care are important in promoting health and wellness. Ask your health care provider about:  The right schedule for you to have regular tests and exams.  Things you can do on your own to prevent diseases and keep yourself healthy. What should I know about diet, weight, and exercise? Eat a healthy diet   Eat a diet that includes plenty of vegetables, fruits, low-fat dairy products, and lean protein.  Do not eat a lot of foods that are high in solid fats, added sugars, or sodium. Maintain a healthy weight Body mass index (BMI) is used to identify weight problems. It estimates body fat based on height and weight. Your health care provider can help determine your BMI and help you achieve or maintain a healthy weight. Get regular exercise Get regular exercise. This is one of the most important things you can do for your health. Most adults should:  Exercise for at least 150 minutes each week. The exercise should increase your heart rate and make you sweat (moderate-intensity exercise).  Do strengthening exercises at least twice a week. This is in addition to the moderate-intensity exercise.  Spend less time sitting. Even light physical activity can be beneficial. Watch cholesterol and blood lipids Have your blood tested for lipids and cholesterol at 73 years of age, then have this test every 5 years. Have your cholesterol levels checked more often if:  Your lipid or cholesterol levels are high.  You are older than 73 years of age.  You are at high risk for heart disease. What should I know about cancer screening? Depending on your health history and family history, you may need to have cancer screening at various ages. This may include screening for:  Breast cancer.  Cervical cancer.  Colorectal cancer.  Skin cancer.  Lung cancer. What should I know about heart disease, diabetes, and high blood  pressure? Blood pressure and heart disease  High blood pressure causes heart disease and increases the risk of stroke. This is more likely to develop in people who have high blood pressure readings, are of African descent, or are overweight.  Have your blood pressure checked: ? Every 3-5 years if you are 18-39 years of age. ? Every year if you are 40 years old or older. Diabetes Have regular diabetes screenings. This checks your fasting blood sugar level. Have the screening done:  Once every three years after age 40 if you are at a normal weight and have a low risk for diabetes.  More often and at a younger age if you are overweight or have a high risk for diabetes. What should I know about preventing infection? Hepatitis B If you have a higher risk for hepatitis B, you should be screened for this virus. Talk with your health care provider to find out if you are at risk for hepatitis B infection. Hepatitis C Testing is recommended for:  Everyone born from 1945 through 1965.  Anyone with known risk factors for hepatitis C. Sexually transmitted infections (STIs)  Get screened for STIs, including gonorrhea and chlamydia, if: ? You are sexually active and are younger than 73 years of age. ? You are older than 73 years of age and your health care provider tells you that you are at risk for this type of infection. ? Your sexual activity has changed since you were last screened, and you are at increased risk for chlamydia or gonorrhea. Ask your health care provider if   you are at risk.  Ask your health care provider about whether you are at high risk for HIV. Your health care provider may recommend a prescription medicine to help prevent HIV infection. If you choose to take medicine to prevent HIV, you should first get tested for HIV. You should then be tested every 3 months for as long as you are taking the medicine. Pregnancy  If you are about to stop having your period (premenopausal) and  you may become pregnant, seek counseling before you get pregnant.  Take 400 to 800 micrograms (mcg) of folic acid every day if you become pregnant.  Ask for birth control (contraception) if you want to prevent pregnancy. Osteoporosis and menopause Osteoporosis is a disease in which the bones lose minerals and strength with aging. This can result in bone fractures. If you are 44 years old or older, or if you are at risk for osteoporosis and fractures, ask your health care provider if you should:  Be screened for bone loss.  Take a calcium or vitamin D supplement to lower your risk of fractures.  Be given hormone replacement therapy (HRT) to treat symptoms of menopause. Follow these instructions at home: Lifestyle  Do not use any products that contain nicotine or tobacco, such as cigarettes, e-cigarettes, and chewing tobacco. If you need help quitting, ask your health care provider.  Do not use street drugs.  Do not share needles.  Ask your health care provider for help if you need support or information about quitting drugs. Alcohol use  Do not drink alcohol if: ? Your health care provider tells you not to drink. ? You are pregnant, may be pregnant, or are planning to become pregnant.  If you drink alcohol: ? Limit how much you use to 0-1 drink a day. ? Limit intake if you are breastfeeding.  Be aware of how much alcohol is in your drink. In the [place] one drink equals one 12 oz bottle of beer (355 mL), one 5 oz glass of wine (148 mL), or one 1 oz glass of hard liquor (44 mL). General instructions  Schedule regular health, dental, and eye exams.  Stay current with your vaccines.  Tell your health care provider if: ? You often feel depressed. ? You have ever been abused or do not feel safe at home. Summary  Adopting a healthy lifestyle and getting preventive care are important in promoting health and wellness.  Follow your health care provider's instructions about healthy  diet, exercising, and getting tested or screened for diseases.  Follow your health care provider's instructions on monitoring your cholesterol and blood pressure. This information is not intended to replace advice given to you by your health care provider. Make sure you discuss any questions you have with your health care provider. Document Revised: 10/28/2018 Document Reviewed: 10/28/2018 Elsevier Patient Education  [place].   Chronic Kidney Disease, Adult Chronic kidney disease (CKD) occurs when the kidneys become damaged slowly over a long period of time. The kidneys are a pair of organs that do many important jobs in the body, including:  Removing waste and extra fluid from the blood to make urine.  Making hormones that maintain the amount of fluid in tissues and blood vessels.  Maintaining the right amount of fluids and chemicals in the body. A small amount of kidney damage may not cause problems, but a large amount of damage may make it hard or impossible for the kidneys to work the way they should. If  steps are not taken to slow down kidney damage or to stop it from getting worse, the kidneys may stop working permanently (end-stage renal disease or ESRD). Most of the time, CKD does not go away, but it can often be controlled. People who have CKD are usually able to live normal lives. What are the causes? The most common causes of this condition are diabetes and high blood pressure (hypertension). Other causes include:  Heart and blood vessel (cardiovascular) disease.  Kidney diseases, such as: ? Glomerulonephritis. ? Interstitial nephritis. ? Polycystic kidney disease. ? Renal vascular disease.  Diseases that affect the immune system.  Genetic diseases.  Medicines that damage the kidneys, such as anti-inflammatory medicines.  Being around or being in contact with poisonous (toxic) substances.  A kidney or urinary infection that occurs again and again  (recurs).  Vasculitis. This is swelling or inflammation of the blood vessels.  A problem with urine flow that may be caused by: ? Cancer. ? Having kidney stones more than one time. ? An enlarged prostate, in males. What increases the risk? You are more likely to develop this condition if you:  Are older than age 90.  Are female.  Are African-American, Hispanic, Asian, [place], or American [place].  Are a current or former smoker.  Are obese.  Have a family history of kidney disease or failure.  Often take medicines that are damaging to the kidneys. What are the signs or symptoms? Symptoms of this condition include:  Swelling (edema) of the face, legs, ankles, or feet.  Tiredness (lethargy) and having less energy.  Nausea or vomiting.  Confusion or trouble concentrating.  Problems with urination, such as: ? Painful or burning feeling during urination. ? Decreased urine production. ? Frequent urination, especially at night. ? Bloody urine.  Muscle twitches and cramps, especially in the legs.  Shortness of breath.  Weakness.  Loss of appetite.  Metallic taste in the mouth.  Trouble sleeping.  Dry, itchy skin.  A low blood count (anemia).  Pale lining of the eyelids and surface of the eye (conjunctiva). Symptoms develop slowly and may not be obvious until the kidney damage becomes severe. It is possible to have kidney disease for years without having any symptoms. How is this diagnosed? This condition may be diagnosed based on:  Blood tests.  Urine tests.  Imaging tests, such as an ultrasound or CT scan.  A test in which a sample of tissue is removed from the kidneys to be examined under a microscope (kidney biopsy). These test results will help your health care provider determine how serious the CKD is. How is this treated? There is no cure for most cases of this condition, but treatment usually relieves symptoms and prevents or slows the  progression of the disease. Treatment may include:  Making diet changes, which may require you to avoid alcohol, salty foods (sodium), and foods that are high in potassium, calcium, and protein.  Medicines: ? To lower blood pressure. ? To control blood glucose. ? To relieve anemia. ? To relieve swelling. ? To protect your bones. ? To improve the balance of electrolytes in your blood.  Removing toxic waste from the body through types of dialysis, if the kidneys can no longer do their job (kidney failure).  Managing any other conditions that are causing your CKD or making it worse. Follow these instructions at home: Medicines  Take over-the-counter and prescription medicines only as told by your health care provider. The dose of some medicines  that you take may need to be adjusted.  Do not take any new medicines unless approved by your health care provider. Many medicines can worsen your kidney damage.  Do not take any vitamin and mineral supplements unless approved by your health care provider. Many nutritional supplements can worsen your kidney damage. General instructions  Follow your prescribed diet as told by your health care provider.  Do not use any products that contain nicotine or tobacco, such as cigarettes and e-cigarettes. If you need help quitting, ask your health care provider.  Monitor and track your blood pressure at home. Report changes in your blood pressure as told by your health care provider.  If you are being treated for diabetes, monitor and track your blood sugar (blood glucose) levels as told by your health care provider.  Maintain a healthy weight. If you need help with this, ask your health care provider.  Start or continue an exercise plan. Exercise at least 30 minutes a day, 5 days a week.  Keep your immunizations up to date as told by your health care provider.  Keep all follow-up visits as told by your health care provider. This is important. Where  to find more information  American Association of Kidney Patients: BombTimer.gl  National Kidney Foundation: www.kidney.[place]: https://mathis.com/  [facility]: www.lifeoptions.org and www.kidneyschool.org Contact a health care provider if:  Your symptoms get worse.  You develop new symptoms. Get help right away if:  You develop symptoms of ESRD, which include: ? Headaches. ? Numbness in the hands or feet. ? Easy bruising. ? Frequent hiccups. ? Chest pain. ? Shortness of breath. ? Lack of menstruation, in women.  You have a fever.  You have decreased urine production.  You have pain or bleeding when you urinate. Summary  Chronic kidney disease (CKD) occurs when the kidneys become damaged slowly over a long period of time.  The most common causes of this condition are diabetes and high blood pressure (hypertension).  There is no cure for most cases of this condition, but treatment usually relieves symptoms and prevents or slows the progression of the disease. Treatment may include a combination of medicines and lifestyle changes. This information is not intended to replace advice given to you by your health care provider. Make sure you discuss any questions you have with your health care provider. Document Revised: 10/17/2017 Document Reviewed: 12/12/2016 Elsevier Patient Education  2020 [employer].

## 2020-07-20 NOTE — Progress Notes (Signed)
This visit occurred during the SARS-CoV-2 public health emergency.  Safety protocols were in place, including screening questions prior to the visit, additional usage of staff PPE, and extensive cleaning of exam room while observing appropriate contact time as indicated for disinfecting solutions.  Subjective:     Patient ID: [name] , female    DOB: Aug 11, 1946 , 74 y.o.   MRN: [id number]   Chief Complaint  Patient presents with   Annual Exam   Hypertension    HPI  She is here today for a full physical exam. She is followed by GYN for her pelvic exams. She reports compliance with meds. She is in the process of moving and selling her condo.   Hypertension This is a chronic problem. The current episode started more than 1 year ago. The problem has been gradually improving since onset. The problem is uncontrolled. Pertinent negatives include no blurred vision, chest pain, palpitations or shortness of breath. The current treatment provides moderate improvement.     Past Medical History:  Diagnosis Date   Back pain    Falls    Fibromyalgia    Hypertension    Joint pain    Joint stiffness    Joint swelling    Pure hypercholesterolemia 01/11/2020   Raynaud's disease    age 37's   Sleep apnea    Varicose veins of both lower extremities    Vitamin B12 deficiency    2020   Vitamin D deficiency      Family History  Problem Relation Age of Onset   Hypertension Mother    Hyperthyroidism Mother    Hypertension Father    Prostate cancer Father    Diabetes Brother    Breast cancer Paternal Aunt    Breast cancer Paternal Grandmother    Peripheral vascular disease Other      Current Outpatient Medications:    acetaminophen (TYLENOL) 500 MG tablet, Take 1,000 mg by mouth daily as needed for moderate pain., Disp: , Rfl:    Ascorbic Acid (VITAMIN C) 1000 MG tablet, Take 1,000 mg by mouth daily., Disp: , Rfl:    ASTRAGALUS PO, Take by mouth.  (Patient not taking: Reported on 07/20/2020), Disp: , Rfl:    CALCIUM PO, Take by mouth., Disp: , Rfl:    COLLAGEN PO, Take by mouth., Disp: , Rfl:    Cyanocobalamin (VITAMIN B 12 PO), Take by mouth., Disp: , Rfl:    diazepam (VALIUM) 2 MG tablet, Take one tab one hour prior to procedure, repeat in one hour if needed, Disp: 2 tablet, Rfl: 0   Diclofenac Sodium (PENNSAID) 2 % SOLN, Place onto the skin., Disp: , Rfl:    ECHINACEA EXTRACT PO, Take by mouth., Disp: , Rfl:    hydrochlorothiazide (HYDRODIURIL) 25 MG tablet, Take 1 tablet (25 mg total) by mouth daily., Disp: 90 tablet, Rfl: 1   lisinopril (ZESTRIL) 20 MG tablet, Take 1 tablet (20 mg total) by mouth daily., Disp: 90 tablet, Rfl: 1   Magnesium 100 MG CAPS, Take by mouth., Disp: , Rfl:    Multiple Vitamin (MULTIVITAMIN) capsule, Take 1 capsule by mouth daily., Disp: , Rfl:    Pyridoxine HCl (VITAMIN B6 PO), Take by mouth., Disp: , Rfl:    terbinafine (LAMISIL) 250 MG tablet, Take 1 tablet (250 mg total) by mouth daily., Disp: 90 tablet, Rfl: 0   topiramate (TOPAMAX) 25 MG tablet, Take 1 tablet (25 mg total) by mouth daily with supper., Disp: 30 tablet, Rfl: 0  VITAMIN D PO, Take by mouth., Disp: , Rfl:    Vitamin D, Ergocalciferol, (DRISDOL) 1.25 MG (50000 UNIT) CAPS capsule, Take 1 capsule (50,000 Units total) by mouth every 7 (seven) days., Disp: 12 capsule, Rfl: 1   Zinc 30 MG CAPS, Take by mouth., Disp: , Rfl:    No Known Allergies    The patient states she uses post menopausal status for birth control. Last LMP was No LMP recorded. Patient is postmenopausal.. Negative for Dysmenorrhea. Negative for: breast discharge, breast lump(s), breast pain and breast self exam. Associated symptoms include abnormal vaginal bleeding. Pertinent negatives include abnormal bleeding (hematology), anxiety, decreased libido, depression, difficulty falling sleep, dyspareunia, history of infertility, nocturia, sexual dysfunction, sleep  disturbances, urinary incontinence, urinary urgency, vaginal discharge and vaginal itching. Diet regular.The patient states her exercise level is  intermittent.  . The patient's tobacco use is:  Social History   Tobacco Use  Smoking Status Former Smoker  Smokeless Tobacco Never Used  Tobacco Comment   quit at age 27,only smoked for 1-2 years  . She has been exposed to passive smoke. The patient's alcohol use is:  Social History   Substance and Sexual Activity  Alcohol Use Yes   Comment: 2 drinks weekly  .    Review of Systems  Constitutional: Negative.   HENT: Negative.   Eyes: Negative.  Negative for blurred vision.  Respiratory: Negative.  Negative for shortness of breath.   Cardiovascular: Negative.  Negative for chest pain and palpitations.  Endocrine: Negative.   Genitourinary: Negative.   Musculoskeletal: Negative.   Skin: Negative.   Allergic/Immunologic: Negative.   Neurological: Negative.   Hematological: Negative.   Psychiatric/Behavioral: Negative.      Today's Vitals   07/20/20 1137  BP: 140/82  Pulse: 72  Temp: 97.9 F (36.6 C)   There is no height or weight on file to calculate BMI.   Objective:  Physical Exam Vitals and nursing note reviewed.  Constitutional:      General: She is not in acute distress.    Appearance: Normal appearance. She is well-developed. She is obese.  HENT:     Head: Normocephalic and atraumatic.     Right Ear: Hearing, tympanic membrane, ear canal and external ear normal. There is no impacted cerumen.     Left Ear: Hearing, tympanic membrane, ear canal and external ear normal. There is no impacted cerumen.     Nose:     Comments: Deferred, masked    Mouth/Throat:     Comments: Deferred, masked Eyes:     General: Lids are normal.     Extraocular Movements: Extraocular movements intact.     Conjunctiva/sclera: Conjunctivae normal.     Pupils: Pupils are equal, round, and reactive to light.     Funduscopic exam:    Right  eye: No papilledema.        Left eye: No papilledema.  Neck:     Thyroid: No thyroid mass.     Vascular: No carotid bruit.  Cardiovascular:     Rate and Rhythm: Normal rate and regular rhythm.     Pulses: Normal pulses.     Heart sounds: Normal heart sounds. No murmur heard.   Pulmonary:     Effort: Pulmonary effort is normal.     Breath sounds: Normal breath sounds.  Chest:     Breasts: Tanner Score is 5.        Right: Normal.        Left: Normal.  Abdominal:  General: Bowel sounds are normal. There is no distension.     Palpations: Abdomen is soft.     Tenderness: There is no abdominal tenderness.     Comments: Rounded, soft  Genitourinary:    Rectum: Guaiac result negative.  Musculoskeletal:        General: No swelling. Normal range of motion.     Cervical back: Full passive range of motion without pain, normal range of motion and neck supple.     Right lower leg: No edema.     Left lower leg: No edema.  Skin:    General: Skin is warm and dry.     Capillary Refill: Capillary refill takes less than 2 seconds.  Neurological:     General: No focal deficit present.     Mental Status: She is alert and oriented to person, place, and time.     Cranial Nerves: No cranial nerve deficit.     Sensory: No sensory deficit.  Psychiatric:        Mood and Affect: Mood normal.        Behavior: Behavior normal.        Thought Content: Thought content normal.        Judgment: Judgment normal.         Assessment And Plan:     1. Routine general medical examination at health care facility Comments: A full exam was performed. Importance of monthly self breast exam was discussed with the patient. PATIENT IS ADVISED TO GET 30-45 MINUTES REGULAR EXERCISE NO LESS THAN FOUR TO FIVE DAYS PER WEEK - BOTH WEIGHTBEARING EXERCISES AND AEROBIC ARE RECOMMENDED.  PATIENT IS ADVISED TO FOLLOW A HEALTHY DIET WITH AT LEAST SIX FRUITS/VEGGIES PER DAY, DECREASE INTAKE OF RED MEAT, AND TO INCREASE FISH  INTAKE TO TWO DAYS PER WEEK.  MEATS/FISH SHOULD NOT BE FRIED, BAKED OR BROILED IS PREFERABLE.  I SUGGEST WEARING SPF 50 SUNSCREEN ON EXPOSED PARTS AND ESPECIALLY WHEN IN THE DIRECT SUNLIGHT FOR AN EXTENDED PERIOD OF TIME.  PLEASE AVOID FAST FOOD RESTAURANTS AND INCREASE YOUR WATER INTAKE.   2. Parenchymal renal hypertension, stage 1 through stage 4 or unspecified chronic kidney disease Comments: Chronic, fair control. She will continue with current meds for now. EKG not performed, one was done earlier this year with Cardiology. Recent labs from [facility] reviewed in full detail.   3. Chronic renal disease, stage II Comments: Chronic, I will check GFR, Cr today. Encouraged to stay well hydrated and importance of maintaining optimal BP control is stressed to the patient.   4. Pure hypercholesterolemia Comments: Goal LDL is less than 100. Most recent results show LDL 130. Encouraged to avoid fried foods and to aim for at least 150 minutes of exercise per week. She does not wish to take statins at this time. Will consider use of lipid supplement if RYR, CoQ10 if her levels remain elevated.   5. Other abnormal glucose Comments: I will check an a1c. She has had elevated glucose/hba1c in the past. Encouraged to avoid sugary beverages, including diet drinks - Hemoglobin A1c  6. Vitamin D deficiency Comments: I will check vitamin D level and supplement as needed.  - Vitamin D, Ergocalciferol, (DRISDOL) 1.25 MG (50000 UNIT) CAPS capsule; Take 1 capsule (50,000 Units total) by mouth every 7 (seven) days.  Dispense: 12 capsule; Refill: 1  7. Drug therapy - Liver Profile     Patient was given opportunity to ask questions. Patient verbalized understanding of the plan and was able  to repeat key elements of the plan. All questions were answered to their satisfaction.   Maximino Greenland, MD   I, Maximino Greenland, MD, have reviewed all documentation for this visit. The documentation on 07/24/20 for  the exam, diagnosis, procedures, and orders are all accurate and complete.  THE PATIENT IS ENCOURAGED TO PRACTICE SOCIAL DISTANCING DUE TO THE COVID-19 PANDEMIC.

## 2020-07-20 NOTE — Progress Notes (Deleted)
This visit occurred during the SARS-CoV-2 public health emergency.  Safety protocols were in place, including screening questions prior to the visit, additional usage of staff PPE, and extensive cleaning of exam room while observing appropriate contact time as indicated for disinfecting solutions.  Subjective:     Patient ID: [name] , female    DOB: [birth year] , 74 y.o.   MRN: [id number]   Chief Complaint  Patient presents with  . Annual Exam  . Hypertension    HPI  She is here today for a full physical exam. She is followed by GYN for her pelvic exams. Patient refused weight and height.    Hypertension This is a chronic problem. The current episode started more than 1 year ago. The problem has been gradually improving since onset. The problem is uncontrolled. Pertinent negatives include no blurred vision, chest pain, palpitations or shortness of breath. The current treatment provides moderate improvement.     Past Medical History:  Diagnosis Date  . Back pain   . Falls   . Fibromyalgia   . Hypertension   . Joint pain   . Joint stiffness   . Joint swelling   . Pure hypercholesterolemia 01/11/2020  . Raynaud's disease    age 23's  . Sleep apnea   . Varicose veins of both lower extremities   . Vitamin B12 deficiency    2020  . Vitamin D deficiency      Family History  Problem Relation Age of Onset  . Hypertension Mother   . Hyperthyroidism Mother   . Hypertension Father   . Prostate cancer Father   . Diabetes Brother   . Breast cancer Paternal Aunt   . Breast cancer Paternal Grandmother   . Peripheral vascular disease Other      Current Outpatient Medications:  .  acetaminophen (TYLENOL) 500 MG tablet, Take 1,000 mg by mouth daily as needed for moderate pain., Disp: , Rfl:  .  Ascorbic Acid (VITAMIN C) 1000 MG tablet, Take 1,000 mg by mouth daily., Disp: , Rfl:  .  ASTRAGALUS PO, Take by mouth. (Patient not taking: Reported on 07/20/2020), Disp: , Rfl:   .  CALCIUM PO, Take by mouth., Disp: , Rfl:  .  COLLAGEN PO, Take by mouth., Disp: , Rfl:  .  Cyanocobalamin (VITAMIN B 12 PO), Take by mouth., Disp: , Rfl:  .  diazepam (VALIUM) 2 MG tablet, Take one tab one hour prior to procedure, repeat in one hour if needed, Disp: 2 tablet, Rfl: 0 .  Diclofenac Sodium (PENNSAID) 2 % SOLN, Place onto the skin., Disp: , Rfl:  .  ECHINACEA EXTRACT PO, Take by mouth., Disp: , Rfl:  .  hydrochlorothiazide (HYDRODIURIL) 25 MG tablet, Take 1 tablet (25 mg total) by mouth daily., Disp: 90 tablet, Rfl: 1 .  lisinopril (ZESTRIL) 20 MG tablet, Take 1 tablet (20 mg total) by mouth daily., Disp: 90 tablet, Rfl: 1 .  Magnesium 100 MG CAPS, Take by mouth., Disp: , Rfl:  .  Multiple Vitamin (MULTIVITAMIN) capsule, Take 1 capsule by mouth daily., Disp: , Rfl:  .  Pyridoxine HCl (VITAMIN B6 PO), Take by mouth., Disp: , Rfl:  .  terbinafine (LAMISIL) 250 MG tablet, Take 1 tablet (250 mg total) by mouth daily., Disp: 90 tablet, Rfl: 0 .  topiramate (TOPAMAX) 25 MG tablet, Take 1 tablet (25 mg total) by mouth daily with supper., Disp: 30 tablet, Rfl: 0 .  VITAMIN D PO, Take by mouth., Disp: , Rfl:  .  Vitamin D, Ergocalciferol, (DRISDOL) 1.25 MG (50000 UNIT) CAPS capsule, Take 1 capsule (50,000 Units total) by mouth every 7 (seven) days., Disp: 12 capsule, Rfl: 1 .  Zinc 30 MG CAPS, Take by mouth., Disp: , Rfl:    No Known Allergies    The patient states she uses {contraceptive methods:5051} for birth control. Last LMP was No LMP recorded. Patient is postmenopausal.. {Dysmenorrhea-menorrhagia:21918}. Negative for: breast discharge, breast lump(s), breast pain and breast self exam. Associated symptoms include abnormal vaginal bleeding. Pertinent negatives include abnormal bleeding (hematology), anxiety, decreased libido, depression, difficulty falling sleep, dyspareunia, history of infertility, nocturia, sexual dysfunction, sleep disturbances, urinary incontinence, urinary  urgency, vaginal discharge and vaginal itching. Diet regular.The patient states her exercise level is    . The patient's tobacco use is:  Social History   Tobacco Use  Smoking Status Former Smoker  Smokeless Tobacco Never Used  Tobacco Comment   quit at age 70,only smoked for 1-2 years  . She has been exposed to passive smoke. The patient's alcohol use is:  Social History   Substance and Sexual Activity  Alcohol Use Yes   Comment: 2 drinks weekly  . Additional information: Last pap ***, next one scheduled for ***.    Review of Systems  Constitutional: Negative.   HENT: Negative.   Eyes: Negative.  Negative for blurred vision.  Respiratory: Negative.  Negative for shortness of breath.   Cardiovascular: Negative.  Negative for chest pain and palpitations.  Gastrointestinal: Negative.   Endocrine: Negative.   Genitourinary: Negative.   Musculoskeletal: Negative.   Skin: Negative.   Allergic/Immunologic: Negative.   Neurological: Negative.   Hematological: Negative.   Psychiatric/Behavioral: Negative.      Today's Vitals   07/20/20 1137  BP: 140/82  Pulse: 72  Temp: 97.9 F (36.6 C)   There is no height or weight on file to calculate BMI.   Objective:  Physical Exam      Assessment And Plan:     1. Routine general medical examination at health care facility Comments: A full exam was performed. Importance of monthly self breast exam was discussed with the patient.   2. Parenchymal renal hypertension, stage 1 through stage 4 or unspecified chronic kidney disease Comments: Chronic, fair control. She will continue with current meds for now. EKG not performed, one was done earlier this year with Cardiology.   3. Chronic renal disease, stage II Comments: Chronic, I will check GFR, Cr today. Encouraged to stay well hydrated and importance of maintaining optimal BP control is stressed to the patient.   4. Pure hypercholesterolemia Comments: Goal LDL is less than 100.  Encouraged to avoid fried foods and to aim for at least 150 minutes of exercise per week. She does not wish to take statins. Will con  5. Other abnormal glucose Comments: I will check an a1c. She has had elevated glucose/hba1c in the past. Encouraged to avoid sugary beverages, including diet drinks - Hemoglobin A1c  6. Vitamin D deficiency Comments: I will check vitamin D level and supplement as needed.  - Vitamin D, Ergocalciferol, (DRISDOL) 1.25 MG (50000 UNIT) CAPS capsule; Take 1 capsule (50,000 Units total) by mouth every 7 (seven) days.  Dispense: 12 capsule; Refill: 1  7. Drug therapy - Liver Profile     Patient was given opportunity to ask questions. Patient verbalized understanding of the plan and was able to repeat key elements of the plan. All questions were answered to their satisfaction.   Maximino Greenland,  MD   I, Maximino Greenland, MD, have reviewed all documentation for this visit. The documentation on 07/29/20 for the exam, diagnosis, procedures, and orders are all accurate and complete.  THE PATIENT IS ENCOURAGED TO PRACTICE SOCIAL DISTANCING DUE TO THE COVID-19 PANDEMIC.

## 2020-07-20 NOTE — Patient Instructions (Signed)
[name] , Thank you for taking time to come for your Medicare Wellness Visit. I appreciate your ongoing commitment to your health goals. Please review the following plan we discussed and let me know if I can assist you in the future.   Screening recommendations/referrals: Colonoscopy: completed 06/05/2017 Mammogram: patient to schedule Bone Density: completed 05/20/2017 Recommended yearly ophthalmology/optometry visit for glaucoma screening and checkup Recommended yearly dental visit for hygiene and checkup  Vaccinations: Influenza vaccine: today Pneumococcal vaccine: completed 02/02/2019 Tdap vaccine: 06/10/2013 Shingles vaccine: discussed   Covid-19: 01/13/2020, 12/23/2019  Advanced directives: Advance directive discussed with you today. Even though you declined this today please call our office should you change your mind and we can give you the proper paperwork for you to fill out.  Conditions/risks identified: none  Next appointment: 01/17/2021 at 10:45 Follow up in one year for your annual wellness visit    Preventive Care 65 Years and Older, Female Preventive care refers to lifestyle choices and visits with your health care provider that can promote health and wellness. What does preventive care include?  A yearly physical exam. This is also called an annual well check.  Dental exams once or twice a year.  Routine eye exams. Ask your health care provider how often you should have your eyes checked.  Personal lifestyle choices, including:  Daily care of your teeth and gums.  Regular physical activity.  Eating a healthy diet.  Avoiding tobacco and drug use.  Limiting alcohol use.  Practicing safe sex.  Taking low-dose aspirin every day.  Taking vitamin and mineral supplements as recommended by your health care provider. What happens during an annual well check? The services and screenings done by your health care provider during your annual well check will depend on  your age, overall health, lifestyle risk factors, and family history of disease. Counseling  Your health care provider may ask you questions about your:  Alcohol use.  Tobacco use.  Drug use.  Emotional well-being.  Home and relationship well-being.  Sexual activity.  Eating habits.  History of falls.  Memory and ability to understand (cognition).  Work and work Statistician.  Reproductive health. Screening  You may have the following tests or measurements:  Height, weight, and BMI.  Blood pressure.  Lipid and cholesterol levels. These may be checked every 5 years, or more frequently if you are over 76 years old.  Skin check.  Lung cancer screening. You may have this screening every year starting at age 34 if you have a 30-pack-year history of smoking and currently smoke or have quit within the past 15 years.  Fecal occult blood test (FOBT) of the stool. You may have this test every year starting at age 17.  Flexible sigmoidoscopy or colonoscopy. You may have a sigmoidoscopy every 5 years or a colonoscopy every 10 years starting at age 2.  Hepatitis C blood test.  Hepatitis B blood test.  Sexually transmitted disease (STD) testing.  Diabetes screening. This is done by checking your blood sugar (glucose) after you have not eaten for a while (fasting). You may have this done every 1-3 years.  Bone density scan. This is done to screen for osteoporosis. You may have this done starting at age 74.  Mammogram. This may be done every 1-2 years. Talk to your health care provider about how often you should have regular mammograms. Talk with your health care provider about your test results, treatment options, and if necessary, the need for more tests. Vaccines  Your health care provider may recommend certain vaccines, such as:  Influenza vaccine. This is recommended every year.  Tetanus, diphtheria, and acellular pertussis (Tdap, Td) vaccine. You may need a Td booster  every 10 years.  Zoster vaccine. You may need this after age 31.  Pneumococcal 13-valent conjugate (PCV13) vaccine. One dose is recommended after age 47.  Pneumococcal polysaccharide (PPSV23) vaccine. One dose is recommended after age 60. Talk to your health care provider about which screenings and vaccines you need and how often you need them. This information is not intended to replace advice given to you by your health care provider. Make sure you discuss any questions you have with your health care provider. Document Released: 12/01/2015 Document Revised: 07/24/2016 Document Reviewed: 09/05/2015 Elsevier Interactive Patient Education  2017 [place] Prevention in the Home Falls can cause injuries. They can happen to people of all ages. There are many things you can do to make your home safe and to help prevent falls. What can I do on the outside of my home?  Regularly fix the edges of walkways and driveways and fix any cracks.  Remove anything that might make you trip as you walk through a door, such as a raised step or threshold.  Trim any bushes or trees on the path to your home.  Use bright outdoor lighting.  Clear any walking paths of anything that might make someone trip, such as rocks or tools.  Regularly check to see if handrails are loose or broken. Make sure that both sides of any steps have handrails.  Any raised decks and porches should have guardrails on the edges.  Have any leaves, snow, or ice cleared regularly.  Use sand or salt on walking paths during winter.  Clean up any spills in your garage right away. This includes oil or grease spills. What can I do in the bathroom?  Use night lights.  Install grab bars by the toilet and in the tub and shower. Do not use towel bars as grab bars.  Use non-skid mats or decals in the tub or shower.  If you need to sit down in the shower, use a plastic, non-slip stool.  Keep the floor dry. Clean up any  water that spills on the floor as soon as it happens.  Remove soap buildup in the tub or shower regularly.  Attach bath mats securely with double-sided non-slip rug tape.  Do not have throw rugs and other things on the floor that can make you trip. What can I do in the bedroom?  Use night lights.  Make sure that you have a light by your bed that is easy to reach.  Do not use any sheets or blankets that are too big for your bed. They should not hang down onto the floor.  Have a firm chair that has side arms. You can use this for support while you get dressed.  Do not have throw rugs and other things on the floor that can make you trip. What can I do in the kitchen?  Clean up any spills right away.  Avoid walking on wet floors.  Keep items that you use a lot in easy-to-reach places.  If you need to reach something above you, use a strong step stool that has a grab bar.  Keep electrical cords out of the way.  Do not use floor polish or wax that makes floors slippery. If you must use wax, use non-skid floor wax.  Do  not have throw rugs and other things on the floor that can make you trip. What can I do with my stairs?  Do not leave any items on the stairs.  Make sure that there are handrails on both sides of the stairs and use them. Fix handrails that are broken or loose. Make sure that handrails are as long as the stairways.  Check any carpeting to make sure that it is firmly attached to the stairs. Fix any carpet that is loose or worn.  Avoid having throw rugs at the top or bottom of the stairs. If you do have throw rugs, attach them to the floor with carpet tape.  Make sure that you have a light switch at the top of the stairs and the bottom of the stairs. If you do not have them, ask someone to add them for you. What else can I do to help prevent falls?  Wear shoes that:  Do not have high heels.  Have rubber bottoms.  Are comfortable and fit you well.  Are closed  at the toe. Do not wear sandals.  If you use a stepladder:  Make sure that it is fully opened. Do not climb a closed stepladder.  Make sure that both sides of the stepladder are locked into place.  Ask someone to hold it for you, if possible.  Clearly mark and make sure that you can see:  Any grab bars or handrails.  First and last steps.  Where the edge of each step is.  Use tools that help you move around (mobility aids) if they are needed. These include:  Canes.  Walkers.  Scooters.  Crutches.  Turn on the lights when you go into a dark area. Replace any light bulbs as soon as they burn out.  Set up your furniture so you have a clear path. Avoid moving your furniture around.  If any of your floors are uneven, fix them.  If there are any pets around you, be aware of where they are.  Review your medicines with your doctor. Some medicines can make you feel dizzy. This can increase your chance of falling. Ask your doctor what other things that you can do to help prevent falls. This information is not intended to replace advice given to you by your health care provider. Make sure you discuss any questions you have with your health care provider. Document Released: 08/31/2009 Document Revised: 04/11/2016 Document Reviewed: 12/09/2014 Elsevier Interactive Patient Education  2017 [employer].

## 2020-07-20 NOTE — Progress Notes (Signed)
This visit occurred during the SARS-CoV-2 public health emergency.  Safety protocols were in place, including screening questions prior to the visit, additional usage of staff PPE, and extensive cleaning of exam room while observing appropriate contact time as indicated for disinfecting solutions.  Subjective:   [name] is a 74 y.o. female who presents for Medicare Annual (Subsequent) preventive examination.  Review of Systems     Cardiac Risk Factors include: advanced age (>29men, >52 women);hypertension;sedentary lifestyle     Objective:    Today's Vitals   07/20/20 1057  BP: 140/82  Pulse: 72  Temp: 97.9 F (36.6 C)  TempSrc: Oral   There is no height or weight on file to calculate BMI.  Advanced Directives 07/20/2020 07/15/2019 02/09/2019  Does Patient Have a Medical Advance Directive? No No No    Current Medications (verified) Outpatient Encounter Medications as of 07/20/2020  Medication Sig  . acetaminophen (TYLENOL) 500 MG tablet Take 1,000 mg by mouth daily as needed for moderate pain.  . Ascorbic Acid (VITAMIN C) 1000 MG tablet Take 1,000 mg by mouth daily.  [name] CALCIUM PO Take by mouth.  . COLLAGEN PO Take by mouth.  . Cyanocobalamin (VITAMIN B 12 PO) Take by mouth.  . diazepam (VALIUM) 2 MG tablet Take one tab one hour prior to procedure, repeat in one hour if needed  . Diclofenac Sodium (PENNSAID) 2 % SOLN Place onto the skin.  [name] ECHINACEA EXTRACT PO Take by mouth.  [name] lisinopril (ZESTRIL) 20 MG tablet Take 1 tablet (20 mg total) by mouth daily.  . Magnesium 100 MG CAPS Take by mouth.  . Multiple Vitamin (MULTIVITAMIN) capsule Take 1 capsule by mouth daily.  . Pyridoxine HCl (VITAMIN B6 PO) Take by mouth.  . terbinafine (LAMISIL) 250 MG tablet Take 1 tablet (250 mg total) by mouth daily.  [name] topiramate (TOPAMAX) 25 MG tablet Take 1 tablet (25 mg total) by mouth daily with supper.  [name] VITAMIN D PO Take by mouth.  . Zinc 30 MG CAPS Take by mouth.  .  [DISCONTINUED] Vitamin D, Ergocalciferol, (DRISDOL) 1.25 MG (50000 UNIT) CAPS capsule Take 1 capsule (50,000 Units total) by mouth every 7 (seven) days.  . ASTRAGALUS PO Take by mouth. (Patient not taking: Reported on 07/20/2020)  . hydrochlorothiazide (HYDRODIURIL) 25 MG tablet Take 1 tablet (25 mg total) by mouth daily.   No facility-administered encounter medications on file as of 07/20/2020.    Allergies (verified) Patient has no known allergies.   History: Past Medical History:  Diagnosis Date  . Back pain   . Falls   . Fibromyalgia   . Hypertension   . Joint pain   . Joint stiffness   . Joint swelling   . Pure hypercholesterolemia 01/11/2020  . Raynaud's disease    age 62's  . Sleep apnea   . Varicose veins of both lower extremities   . Vitamin B12 deficiency    2020  . Vitamin D deficiency    Past Surgical History:  Procedure Laterality Date  . BREAST BIOPSY Right   . BREAST EXCISIONAL BIOPSY Right   . FOOT FRACTURE SURGERY Right   . KNEE SURGERY Left 2007  . TUBAL LIGATION     Family History  Problem Relation Age of Onset  . Hypertension Mother   . Hyperthyroidism Mother   . Hypertension Father   . Prostate cancer Father   . Diabetes Brother   . Breast cancer Paternal Aunt   . Breast cancer Paternal Grandmother   .  Peripheral vascular disease Other    Social History   Socioeconomic History  . Marital status: Single    Spouse name: Not on file  . Number of children: Not on file  . Years of education: Not on file  . Highest education level: Not on file  Occupational History  . Not on file  Tobacco Use  . Smoking status: Former Research scientist (life sciences)  . Smokeless tobacco: Never Used  . Tobacco comment: quit at age 56,only smoked for 1-2 years  Vaping Use  . Vaping Use: Never used  Substance and Sexual Activity  . Alcohol use: Yes    Comment: 2 drinks weekly  . Drug use: No  . Sexual activity: Not Currently  Other Topics Concern  . Not on file  Social History  Narrative  . Not on file   Social Determinants of Health   Financial Resource Strain: Low Risk   . Difficulty of Paying Living Expenses: Not hard at all  Food Insecurity: No Food Insecurity  . Worried About Charity fundraiser in the Last Year: Never true  . Ran Out of Food in the Last Year: Never true  Transportation Needs: No Transportation Needs  . Lack of Transportation (Medical): No  . Lack of Transportation (Non-Medical): No  Physical Activity: Inactive  . Days of Exercise per Week: 0 days  . Minutes of Exercise per Session: 0 min  Stress: No Stress Concern Present  . Feeling of Stress : Only a little  Social Connections:   . Frequency of Communication with Friends and Family: Not on file  . Frequency of Social Gatherings with Friends and Family: Not on file  . Attends Religious Services: Not on file  . Active Member of Clubs or Organizations: Not on file  . Attends Archivist Meetings: Not on file  . Marital Status: Not on file    Tobacco Counseling Counseling given: Not Answered Comment: quit at age 56,only smoked for 1-2 years   Clinical Intake:  Pre-visit preparation completed: Yes  Pain : No/denies pain     Nutritional Risks: None Diabetes: No  How often do you need to have someone help you when you read instructions, pamphlets, or other written materials from your doctor or pharmacy?: 1 - Never What is the last grade level you completed in school?: college +  Diabetic? no  Interpreter Needed?: No  Information entered by :: NAllen LPN   Activities of Daily Living In your present state of health, do you have any difficulty performing the following activities: 07/20/2020  Hearing? N  Vision? Y  Comment has floaters  Difficulty concentrating or making decisions? N  Walking or climbing stairs? Y  Dressing or bathing? N  Doing errands, shopping? N  Preparing Food and eating ? N  Using the Toilet? N  In the past six months, have you  accidently leaked urine? N  Do you have problems with loss of bowel control? N  Managing your Medications? N  Managing your Finances? N  Housekeeping or managing your Housekeeping? N  Some recent data might be hidden    Patient Care Team: Glendale Chard, MD as PCP - General (Internal Medicine) Skeet Latch, MD as PCP - Cardiology (Cardiology)  Indicate any recent Medical Services you may have received from other than [facility] providers in the past year (date may be approximate).     Assessment:   This is a routine wellness examination for Glide.  Hearing/Vision screen  Hearing Screening   125Hz   250Hz  500Hz  1000Hz  2000Hz  3000Hz  4000Hz  6000Hz  8000Hz   Right ear:           Left ear:           Vision Screening Comments: Regular eye exams, Dr. Ronnald Ramp  Dietary issues and exercise activities discussed: Current Exercise Habits: The patient does not participate in regular exercise at present  Goals    . Patient Stated     07/15/2019, wants to get out of pain    . Patient Stated     07/20/2020, wants to weigh 172 pounds    . Weight (lb) < 200 lb (90.7 kg)     07/15/2019, wants to weigh 170 pounds      Depression Screen PHQ 2/9 Scores 07/20/2020 04/18/2020 12/09/2019 07/15/2019 07/08/2019 06/30/2019 06/25/2019  PHQ - 2 Score 0 2 0 0 0 0 0  PHQ- 9 Score - 8 - 1 - - -    Fall Risk Fall Risk  07/20/2020 12/09/2019 07/15/2019 07/08/2019 06/30/2019  Falls in the past year? 0 0 0 0 0  Number falls in past yr: - - - - -  Injury with Fall? - - - - -  Risk for fall due to : Medication side effect - Medication side effect - -  Follow up Falls evaluation completed;Education provided;Falls prevention discussed - Falls evaluation completed;Education provided;Falls prevention discussed - -    Any stairs in or around the home? Yes  If so, are there any without handrails? No  Home free of loose throw rugs in walkways, pet beds, electrical cords, etc? Yes  Adequate lighting in your home to reduce risk  of falls? Yes   ASSISTIVE DEVICES UTILIZED TO PREVENT FALLS:  Life alert? No  Use of a cane, walker or w/c? No  Grab bars in the bathroom? No  Shower chair or bench in shower? No  Elevated toilet seat or a handicapped toilet? No   TIMED UP AND GO:  Was the test performed? No .    Gait steady and fast without use of assistive device  Cognitive Function:     6CIT Screen 07/20/2020 07/15/2019  What Year? 0 points 0 points  What month? 0 points 0 points  What time? 0 points 0 points  Count back from 20 0 points 0 points  Months in reverse 0 points 0 points  Repeat phrase 0 points 0 points  Total Score 0 0    Immunizations Immunization History  Administered Date(s) Administered  . DTaP 06/10/2013  . Influenza, High Dose Seasonal PF 09/11/2018  . Influenza-Unspecified 07/21/2019  . PFIZER SARS-COV-2 Vaccination 12/23/2019, 01/13/2020  . Pneumococcal Conjugate-13 02/02/2019  . Pneumococcal-Unspecified 06/10/2013    TDAP status: Up to date Flu Vaccine status: Completed at today's visit Pneumococcal vaccine status: Up to date Covid-19 vaccine status: Completed vaccines  Qualifies for Shingles Vaccine? Yes   Zostavax completed No   Shingrix Completed?: No.    Education has been provided regarding the importance of this vaccine. Patient has been advised to call insurance company to determine out of pocket expense if they have not yet received this vaccine. Advised may also receive vaccine at local pharmacy or Health Dept. Verbalized acceptance and understanding.  Screening Tests Health Maintenance  Topic Date Due  . INFLUENZA VACCINE  06/18/2020  . MAMMOGRAM  05/02/2021  . TETANUS/TDAP  06/11/2023  . COLONOSCOPY  06/06/2027  . DEXA SCAN  Completed  . COVID-19 Vaccine  Completed  . Hepatitis C Screening  Completed  . PNA  vac Low Risk Adult  Completed    Health Maintenance  Health Maintenance Due  Topic Date Due  . INFLUENZA VACCINE  06/18/2020    Colorectal  cancer screening: Completed 06/05/2017. Repeat every 10 years Mammogram status: Patient to schedule Bone Density status: Completed 05/20/2017.   Lung Cancer Screening: (Low Dose CT Chest recommended if Age 2-80 years, 30 pack-year currently smoking OR have quit w/in 15years.) does not qualify.   Lung Cancer Screening Referral: no  Additional Screening:  Hepatitis C Screening: does qualify; Completed 05/05/2017   Vision Screening: Recommended annual ophthalmology exams for early detection of glaucoma and other disorders of the eye. Is the patient up to date with their annual eye exam?  Yes  Who is the provider or what is the name of the office in which the patient attends annual eye exams? Dr. Ronnald Ramp If pt is not established with a provider, would they like to be referred to a provider to establish care? No .   Dental Screening: Recommended annual dental exams for proper oral hygiene  Community Resource Referral / Chronic Care Management: CRR required this visit?  No   CCM required this visit?  No      Plan:     I have personally reviewed and noted the following in the patient's chart:   . Medical and social history . Use of alcohol, tobacco or illicit drugs  . Current medications and supplements . Functional ability and status . Nutritional status . Physical activity . Advanced directives . List of other physicians . Hospitalizations, surgeries, and ER visits in previous 12 months . Vitals . Screenings to include cognitive, depression, and falls . Referrals and appointments  In addition, I have reviewed and discussed with patient certain preventive protocols, quality metrics, and best practice recommendations. A written personalized care plan for preventive services as well as general preventive health recommendations were provided to patient.     Kellie Simmering, LPN   03/26/1637   Nurse Notes: Patient declined height and weight.

## 2020-07-21 LAB — HEMOGLOBIN A1C
Est. average glucose Bld gHb Est-mCnc: 114 mg/dL
Hgb A1c MFr Bld: 5.6 % (ref 4.8–5.6)

## 2020-07-21 LAB — HEPATIC FUNCTION PANEL
ALT: 20 IU/L (ref 0–32)
AST: 23 IU/L (ref 0–40)
Albumin: 4.3 g/dL (ref 3.7–4.7)
Alkaline Phosphatase: 84 IU/L (ref 48–121)
Bilirubin Total: 0.4 mg/dL (ref 0.0–1.2)
Bilirubin, Direct: 0.12 mg/dL (ref 0.00–0.40)
Total Protein: 7.3 g/dL (ref 6.0–8.5)

## 2020-07-26 DIAGNOSIS — Z0000 Encounter for general adult medical examination without abnormal findings: Secondary | ICD-10-CM | POA: Diagnosis not present

## 2020-07-26 DIAGNOSIS — Z23 Encounter for immunization: Secondary | ICD-10-CM | POA: Diagnosis not present

## 2020-07-26 DIAGNOSIS — I10 Essential (primary) hypertension: Secondary | ICD-10-CM | POA: Diagnosis not present

## 2020-08-11 ENCOUNTER — Other Ambulatory Visit: Payer: Self-pay | Admitting: Cardiovascular Disease

## 2020-08-17 ENCOUNTER — Other Ambulatory Visit: Payer: Self-pay

## 2020-08-17 MED ORDER — LISINOPRIL 20 MG PO TABS
20.0000 mg | ORAL_TABLET | Freq: Every day | ORAL | 1 refills | Status: DC
Start: 2020-08-17 — End: 2021-04-26

## 2020-08-18 ENCOUNTER — Other Ambulatory Visit: Payer: Self-pay

## 2020-08-18 ENCOUNTER — Ambulatory Visit
Admission: RE | Admit: 2020-08-18 | Discharge: 2020-08-18 | Disposition: A | Discharge status: Home / Self Care | Payer: Medicare HMO | Source: Ambulatory Visit | Attending: Internal Medicine | Admitting: Internal Medicine

## 2020-08-18 DIAGNOSIS — Z1231 Encounter for screening mammogram for malignant neoplasm of breast: Secondary | ICD-10-CM

## 2020-08-31 ENCOUNTER — Other Ambulatory Visit: Payer: Self-pay

## 2020-08-31 ENCOUNTER — Other Ambulatory Visit: Payer: Medicare HMO

## 2020-08-31 DIAGNOSIS — Z79899 Other long term (current) drug therapy: Secondary | ICD-10-CM

## 2020-09-01 LAB — HEPATIC FUNCTION PANEL
ALT: 19 IU/L (ref 0–32)
AST: 22 IU/L (ref 0–40)
Albumin: 4.1 g/dL (ref 3.7–4.7)
Alkaline Phosphatase: 94 IU/L (ref 44–121)
Bilirubin Total: 0.2 mg/dL (ref 0.0–1.2)
Bilirubin, Direct: 0.1 mg/dL (ref 0.00–0.40)
Total Protein: 6.7 g/dL (ref 6.0–8.5)

## 2020-09-25 ENCOUNTER — Ambulatory Visit
Admission: RE | Admit: 2020-09-25 | Discharge: 2020-09-25 | Disposition: A | Discharge status: Home / Self Care | Payer: Medicare HMO | Source: Ambulatory Visit | Attending: Internal Medicine | Admitting: Internal Medicine

## 2020-09-25 ENCOUNTER — Other Ambulatory Visit: Payer: Self-pay

## 2020-09-25 DIAGNOSIS — Z1231 Encounter for screening mammogram for malignant neoplasm of breast: Secondary | ICD-10-CM | POA: Diagnosis not present

## 2020-10-02 ENCOUNTER — Ambulatory Visit: Payer: Medicare HMO

## 2020-10-25 DIAGNOSIS — H35342 Macular cyst, hole, or pseudohole, left eye: Secondary | ICD-10-CM | POA: Diagnosis not present

## 2020-10-25 DIAGNOSIS — H35363 Drusen (degenerative) of macula, bilateral: Secondary | ICD-10-CM | POA: Diagnosis not present

## 2020-10-25 DIAGNOSIS — H35371 Puckering of macula, right eye: Secondary | ICD-10-CM | POA: Diagnosis not present

## 2020-10-25 DIAGNOSIS — H35411 Lattice degeneration of retina, right eye: Secondary | ICD-10-CM | POA: Diagnosis not present

## 2020-10-25 DIAGNOSIS — H353131 Nonexudative age-related macular degeneration, bilateral, early dry stage: Secondary | ICD-10-CM | POA: Diagnosis not present

## 2020-10-25 DIAGNOSIS — Z961 Presence of intraocular lens: Secondary | ICD-10-CM | POA: Diagnosis not present

## 2020-10-25 DIAGNOSIS — H33192 Other retinoschisis and retinal cysts, left eye: Secondary | ICD-10-CM | POA: Diagnosis not present

## 2021-01-17 ENCOUNTER — Telehealth: Payer: Self-pay

## 2021-01-17 ENCOUNTER — Ambulatory Visit: Payer: Medicare HMO | Admitting: Internal Medicine

## 2021-01-17 NOTE — Telephone Encounter (Signed)
This nurse called patient in regards to missing today's scheduled appointments. Message left for patient to call back in order to reschedule.

## 2021-01-30 ENCOUNTER — Telehealth: Payer: Self-pay | Admitting: Internal Medicine

## 2021-01-30 NOTE — Telephone Encounter (Signed)
Left message asking patient to call office  Please reschedule 02/08/21 appointment with Pamala Hurry.  Pamala Hurry will be out of office

## 2021-03-14 ENCOUNTER — Telehealth: Payer: Self-pay | Admitting: Internal Medicine

## 2021-03-14 NOTE — Telephone Encounter (Signed)
Left message asking patient to call office please r/s awv 4/28 appointment.  Pamala Hurry will be out of office

## 2021-03-21 ENCOUNTER — Other Ambulatory Visit: Payer: Self-pay

## 2021-03-21 ENCOUNTER — Ambulatory Visit (INDEPENDENT_AMBULATORY_CARE_PROVIDER_SITE_OTHER): Payer: Medicare HMO | Admitting: Nurse Practitioner

## 2021-03-21 VITALS — BP 130/82 | HR 74 | Temp 97.9°F

## 2021-03-21 DIAGNOSIS — N182 Chronic kidney disease, stage 2 (mild): Secondary | ICD-10-CM | POA: Diagnosis not present

## 2021-03-21 DIAGNOSIS — W57XXXA Bitten or stung by nonvenomous insect and other nonvenomous arthropods, initial encounter: Secondary | ICD-10-CM

## 2021-03-21 DIAGNOSIS — E7800 Pure hypercholesterolemia, unspecified: Secondary | ICD-10-CM | POA: Diagnosis not present

## 2021-03-21 DIAGNOSIS — I129 Hypertensive chronic kidney disease with stage 1 through stage 4 chronic kidney disease, or unspecified chronic kidney disease: Secondary | ICD-10-CM | POA: Diagnosis not present

## 2021-03-21 MED ORDER — HYDROCHLOROTHIAZIDE 25 MG PO TABS: 25.0000 mg | ORAL_TABLET | Freq: Every day | ORAL | 1 refills | Status: DC

## 2021-03-21 MED ORDER — DOXYCYCLINE MONOHYDRATE 100 MG PO CAPS: 100.0000 mg | ORAL_CAPSULE | Freq: Two times a day (BID) | ORAL | 0 refills | Status: DC

## 2021-03-21 NOTE — Patient Instructions (Signed)

## 2021-03-21 NOTE — Progress Notes (Signed)
I,Tianna Badgett,acting as a Education administrator for [employer], NP.,have documented all relevant documentation on the behalf of [employer], NP,as directed by  Bary Castilla, NP while in the presence of Bary Castilla, NP.  This visit occurred during the SARS-CoV-2 public health emergency.  Safety protocols were in place, including screening questions prior to the visit, additional usage of staff PPE, and extensive cleaning of exam room while observing appropriate contact time as indicated for disinfecting solutions.  Subjective:     Patient ID: [name] , female    DOB: 1946/11/15 , 75 y.o.   MRN: [id number]   Chief Complaint  Patient presents with  . Insect Bite    HPI  Patient is here for insect bites. She is also due to have her htn and dm check since no showing her March appointment. On Monday afternoon her brother informed her that it was a "fly" on her head. A little while later the area began to swell. The next day there was another area on her chest and back. Patient refuses to weight at appointments. The patient states she has been putting on some peroxide and hydrocortisone cream to help    Past Medical History:  Diagnosis Date  . Back pain   . Falls   . Fibromyalgia   . Hypertension   . Joint pain   . Joint stiffness   . Joint swelling   . Pure hypercholesterolemia 01/11/2020  . Raynaud's disease    age 60's  . Sleep apnea   . Varicose veins of both lower extremities   . Vitamin B12 deficiency    2020  . Vitamin D deficiency      Family History  Problem Relation Age of Onset  . Hypertension Mother   . Hyperthyroidism Mother   . Hypertension Father   . Prostate cancer Father   . Diabetes Brother   . Breast cancer Paternal Aunt   . Breast cancer Paternal Grandmother   . Peripheral vascular disease Other      Current Outpatient Medications:  .  doxycycline (MONODOX) 100 MG capsule, Take 1 capsule (100 mg total) by mouth 2 (two) times  daily., Disp: 20 capsule, Rfl: 0 .  acetaminophen (TYLENOL) 500 MG tablet, Take 1,000 mg by mouth daily as needed for moderate pain., Disp: , Rfl:  .  Ascorbic Acid (VITAMIN C) 1000 MG tablet, Take 1,000 mg by mouth daily., Disp: , Rfl:  .  ASTRAGALUS PO, Take by mouth. (Patient not taking: Reported on 07/20/2020), Disp: , Rfl:  .  CALCIUM PO, Take by mouth., Disp: , Rfl:  .  COLLAGEN PO, Take by mouth., Disp: , Rfl:  .  Cyanocobalamin (VITAMIN B 12 PO), Take by mouth., Disp: , Rfl:  .  diazepam (VALIUM) 2 MG tablet, Take one tab one hour prior to procedure, repeat in one hour if needed, Disp: 2 tablet, Rfl: 0 .  Diclofenac Sodium (PENNSAID) 2 % SOLN, Place onto the skin., Disp: , Rfl:  .  ECHINACEA EXTRACT PO, Take by mouth., Disp: , Rfl:  .  hydrochlorothiazide (HYDRODIURIL) 25 MG tablet, Take 1 tablet (25 mg total) by mouth daily., Disp: 90 tablet, Rfl: 1 .  lisinopril (ZESTRIL) 20 MG tablet, Take 1 tablet (20 mg total) by mouth daily., Disp: 90 tablet, Rfl: 1 .  Magnesium 100 MG CAPS, Take by mouth., Disp: , Rfl:  .  Multiple Vitamin (MULTIVITAMIN) capsule, Take 1 capsule by mouth daily., Disp: , Rfl:  .  Pyridoxine HCl (VITAMIN B6  PO), Take by mouth., Disp: , Rfl:  .  terbinafine (LAMISIL) 250 MG tablet, Take 1 tablet (250 mg total) by mouth daily., Disp: 90 tablet, Rfl: 0 .  topiramate (TOPAMAX) 25 MG tablet, Take 1 tablet (25 mg total) by mouth daily with supper., Disp: 30 tablet, Rfl: 0 .  VITAMIN D PO, Take by mouth., Disp: , Rfl:  .  Vitamin D, Ergocalciferol, (DRISDOL) 1.25 MG (50000 UNIT) CAPS capsule, Take 1 capsule (50,000 Units total) by mouth every 7 (seven) days., Disp: 12 capsule, Rfl: 1 .  Zinc 30 MG CAPS, Take by mouth., Disp: , Rfl:    No Known Allergies   Review of Systems  Constitutional: Negative for chills and fever.  Respiratory: Negative for cough, shortness of breath and wheezing.   Cardiovascular: Positive for leg swelling.  Endocrine: Negative for polydipsia,  polyphagia and polyuria.  Musculoskeletal: Negative for arthralgias.  Skin: Positive for rash and wound.  Neurological: Negative for weakness and headaches.     Today's Vitals   03/21/21 1511  BP: 130/82  Pulse: 74  Temp: 97.9 F (36.6 C)  TempSrc: Oral   There is no height or weight on file to calculate BMI.   Objective:  Physical Exam Constitutional:      Appearance: Normal appearance. She is obese.  HENT:     Head: Normocephalic and atraumatic.  Cardiovascular:     Rate and Rhythm: Normal rate and regular rhythm.     Pulses: Normal pulses.     Heart sounds: Normal heart sounds. No murmur heard.   Pulmonary:     Effort: Pulmonary effort is normal. No respiratory distress.     Breath sounds: Normal breath sounds. No wheezing.  Skin:    General: Skin is warm and dry.     Capillary Refill: Capillary refill takes less than 2 seconds.     Findings: Rash and wound present.       Neurological:     Mental Status: She is alert and oriented to person, place, and time.  Psychiatric:        Mood and Affect: Mood normal.        Behavior: Behavior normal.        Thought Content: Thought content normal.         Assessment And Plan:     1. Bug bite, initial encounter -Patient was outside yesterday and noticed a bug bite on her head. She has self treated with peroxide. Will go ahead and treat her with antx and advised her to not put anything on it. Advised patient she can take benadryl and Pepcid. -Advised patient if the rash/wound gets any bigger, drains or has an odor to give [place] a call.   - doxycycline (MONODOX) 100 MG capsule; Take 1 capsule (100 mg total) by mouth 2 (two) times daily.  Dispense: 20 capsule; Refill: 0 - CBC no Diff  2. Parenchymal renal hypertension, stage 1 through stage 4 or unspecified chronic kidney disease -Chronic, stable  - hydrochlorothiazide (HYDRODIURIL) 25 MG tablet; Take 1 tablet (25 mg total) by mouth daily.  Dispense: 90 tablet; Refill:  1 -Limit the intake of processed foods and salt intake. You should increase your intake of green vegetables and fruits. Limit the use of alcohol. Limit fast foods and fried foods. Avoid high fatty saturated and trans fat foods. Keep yourself hydrated with drinking water. Avoid red meats. Eat lean meats instead. Exercise for atleast 30-45 min for atleast 4-5 times a week.  -  CMP14+EGFR - CBC no Diff  3. Pure hypercholesterolemia -Chronic, stable  -Will check and assess.  -Advised patient of a healthy diet. Exercise for atleast 30-45 min. Daily and avoid fast food and fatty fried foods.  - Lipid panel   Follow up: if symptoms get worse or do not improve.   Patient was given opportunity to ask questions. Patient verbalized understanding of the plan and was able to repeat key elements of the plan. All questions were answered to their satisfaction.  Bary Castilla, DNP   I, Bary Castilla, DNP have reviewed all documentation for this visit. The documentation on 03/21/21  for the exam, diagnosis, procedures, and orders are all accurate and complete.   IF YOU HAVE BEEN REFERRED TO A SPECIALIST, IT MAY TAKE 1-2 WEEKS TO SCHEDULE/PROCESS THE REFERRAL. IF YOU HAVE NOT HEARD FROM US/SPECIALIST IN TWO WEEKS, PLEASE GIVE [place] A CALL AT [phone].   THE PATIENT IS ENCOURAGED TO PRACTICE SOCIAL DISTANCING DUE TO THE COVID-19 PANDEMIC.

## 2021-03-22 ENCOUNTER — Other Ambulatory Visit: Payer: Self-pay | Admitting: Nurse Practitioner

## 2021-03-22 DIAGNOSIS — E7800 Pure hypercholesterolemia, unspecified: Secondary | ICD-10-CM

## 2021-03-22 LAB — CMP14+EGFR
ALT: 17 IU/L (ref 0–32)
AST: 23 IU/L (ref 0–40)
Albumin/Globulin Ratio: 1.1 — ABNORMAL LOW (ref 1.2–2.2)
Albumin: 3.9 g/dL (ref 3.7–4.7)
Alkaline Phosphatase: 83 IU/L (ref 44–121)
BUN/Creatinine Ratio: 19 (ref 12–28)
BUN: 20 mg/dL (ref 8–27)
Bilirubin Total: 0.3 mg/dL (ref 0.0–1.2)
CO2: 21 mmol/L (ref 20–29)
Calcium: 9.3 mg/dL (ref 8.7–10.3)
Chloride: 105 mmol/L (ref 96–106)
Creatinine, Ser: 1.04 mg/dL — ABNORMAL HIGH (ref 0.57–1.00)
Globulin, Total: 3.4 g/dL (ref 1.5–4.5)
Glucose: 91 mg/dL (ref 65–99)
Potassium: 4.6 mmol/L (ref 3.5–5.2)
Sodium: 142 mmol/L (ref 134–144)
Total Protein: 7.3 g/dL (ref 6.0–8.5)
eGFR: 56 mL/min/{1.73_m2} — ABNORMAL LOW (ref >59)

## 2021-03-22 LAB — CBC
Hematocrit: 38 % (ref 34.0–46.6)
Hemoglobin: 12.4 g/dL (ref 11.1–15.9)
MCH: 30 pg (ref 26.6–33.0)
MCHC: 32.6 g/dL (ref 31.5–35.7)
MCV: 92 fL (ref 79–97)
Platelets: 275 10*3/uL (ref 150–450)
RBC: 4.14 x10E6/uL (ref 3.77–5.28)
RDW: 12.7 % (ref 11.7–15.4)
WBC: 5.3 10*3/uL (ref 3.4–10.8)

## 2021-03-22 LAB — LIPID PANEL
Chol/HDL Ratio: 3.9 ratio (ref 0.0–4.4)
Cholesterol, Total: 221 mg/dL — ABNORMAL HIGH (ref 100–199)
HDL: 56 mg/dL (ref >39)
LDL Chol Calc (NIH): 149 mg/dL — ABNORMAL HIGH (ref 0–99)
Triglycerides: 93 mg/dL (ref 0–149)
VLDL Cholesterol Cal: 16 mg/dL (ref 5–40)

## 2021-03-22 MED ORDER — ATORVASTATIN CALCIUM 10 MG PO TABS: 10.0000 mg | ORAL_TABLET | Freq: Every day | ORAL | 2 refills | Status: DC

## 2021-03-26 ENCOUNTER — Telehealth: Payer: Self-pay

## 2021-03-26 NOTE — Telephone Encounter (Signed)
-----   Message from Bary Castilla, NP sent at 03/22/2021 10:13 AM EDT ----- Good morning. 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 At this stage we usually put in a referral for patients to see a kidney specialist. Would you like me to put in a referral to see a specialist? Make sure you are also staying hydrated with water. Your TC is elevated at 221. Your LDL is at 149, and should be below 100. I am going to send a prescription in for you to take statin to help lower your cholesterol level. Also make sure your are eating a healthy low fat diet, avoid fast food, fried food and red meats. Incorporate exercise into your daily routine. Let us know if you have any questions.

## 2021-03-26 NOTE — Telephone Encounter (Signed)
I left the pt a message to call the office for her lab results or view them on her mychart account

## 2021-03-30 DIAGNOSIS — H33192 Other retinoschisis and retinal cysts, left eye: Secondary | ICD-10-CM | POA: Diagnosis not present

## 2021-03-30 DIAGNOSIS — H35342 Macular cyst, hole, or pseudohole, left eye: Secondary | ICD-10-CM | POA: Diagnosis not present

## 2021-03-30 DIAGNOSIS — H35411 Lattice degeneration of retina, right eye: Secondary | ICD-10-CM | POA: Diagnosis not present

## 2021-03-30 DIAGNOSIS — H35363 Drusen (degenerative) of macula, bilateral: Secondary | ICD-10-CM | POA: Diagnosis not present

## 2021-03-30 DIAGNOSIS — H353131 Nonexudative age-related macular degeneration, bilateral, early dry stage: Secondary | ICD-10-CM | POA: Diagnosis not present

## 2021-03-30 DIAGNOSIS — H35372 Puckering of macula, left eye: Secondary | ICD-10-CM | POA: Diagnosis not present

## 2021-03-30 DIAGNOSIS — Z961 Presence of intraocular lens: Secondary | ICD-10-CM | POA: Diagnosis not present

## 2021-04-05 ENCOUNTER — Other Ambulatory Visit: Payer: Self-pay | Admitting: Nurse Practitioner

## 2021-04-05 DIAGNOSIS — N1831 Chronic kidney disease, stage 3a: Secondary | ICD-10-CM

## 2021-04-10 ENCOUNTER — Encounter: Payer: Self-pay | Admitting: Internal Medicine

## 2021-04-10 ENCOUNTER — Other Ambulatory Visit: Payer: Self-pay

## 2021-04-10 DIAGNOSIS — M1990 Unspecified osteoarthritis, unspecified site: Secondary | ICD-10-CM | POA: Diagnosis not present

## 2021-04-10 DIAGNOSIS — I10 Essential (primary) hypertension: Secondary | ICD-10-CM | POA: Diagnosis not present

## 2021-04-10 DIAGNOSIS — Z87891 Personal history of nicotine dependence: Secondary | ICD-10-CM | POA: Diagnosis not present

## 2021-04-10 DIAGNOSIS — I739 Peripheral vascular disease, unspecified: Secondary | ICD-10-CM | POA: Diagnosis not present

## 2021-04-10 DIAGNOSIS — Z833 Family history of diabetes mellitus: Secondary | ICD-10-CM | POA: Diagnosis not present

## 2021-04-10 DIAGNOSIS — Z8249 Family history of ischemic heart disease and other diseases of the circulatory system: Secondary | ICD-10-CM | POA: Diagnosis not present

## 2021-04-10 DIAGNOSIS — Z823 Family history of stroke: Secondary | ICD-10-CM | POA: Diagnosis not present

## 2021-04-10 DIAGNOSIS — Z008 Encounter for other general examination: Secondary | ICD-10-CM | POA: Diagnosis not present

## 2021-04-10 DIAGNOSIS — E7800 Pure hypercholesterolemia, unspecified: Secondary | ICD-10-CM

## 2021-04-10 DIAGNOSIS — E785 Hyperlipidemia, unspecified: Secondary | ICD-10-CM | POA: Diagnosis not present

## 2021-04-10 DIAGNOSIS — G629 Polyneuropathy, unspecified: Secondary | ICD-10-CM | POA: Diagnosis not present

## 2021-04-10 MED ORDER — ATORVASTATIN CALCIUM 10 MG PO TABS
10.0000 mg | ORAL_TABLET | Freq: Every day | ORAL | 1 refills | Status: DC
Start: 2021-04-10 — End: 2021-10-09

## 2021-04-19 ENCOUNTER — Ambulatory Visit (INDEPENDENT_AMBULATORY_CARE_PROVIDER_SITE_OTHER): Payer: Medicare HMO

## 2021-04-19 ENCOUNTER — Ambulatory Visit
Admission: RE | Admit: 2021-04-19 | Discharge: 2021-04-19 | Disposition: A | Discharge status: Home / Self Care | Payer: Medicare HMO | Source: Ambulatory Visit | Attending: Nurse Practitioner | Admitting: Nurse Practitioner

## 2021-04-19 ENCOUNTER — Other Ambulatory Visit: Payer: Self-pay

## 2021-04-19 VITALS — BP 120/72 | HR 71 | Temp 98.1°F

## 2021-04-19 DIAGNOSIS — Z0000 Encounter for general adult medical examination without abnormal findings: Secondary | ICD-10-CM

## 2021-04-19 DIAGNOSIS — E2839 Other primary ovarian failure: Secondary | ICD-10-CM | POA: Diagnosis not present

## 2021-04-19 DIAGNOSIS — Z23 Encounter for immunization: Secondary | ICD-10-CM

## 2021-04-19 DIAGNOSIS — N1831 Chronic kidney disease, stage 3a: Secondary | ICD-10-CM

## 2021-04-19 DIAGNOSIS — N189 Chronic kidney disease, unspecified: Secondary | ICD-10-CM | POA: Diagnosis not present

## 2021-04-19 MED ORDER — SHINGRIX 50 MCG/0.5ML IM SUSR
0.5000 mL | Freq: Once | INTRAMUSCULAR | 0 refills | Status: AC
Start: 2021-04-19 — End: 2021-04-19

## 2021-04-19 NOTE — Patient Instructions (Signed)
Ms. [name] , Thank you for taking time to come for your Medicare Wellness Visit. I appreciate your ongoing commitment to your health goals. Please review the following plan we discussed and let me know if I can assist you in the future.   Screening recommendations/referrals: Colonoscopy: completed 06/05/2017 Mammogram: completed 09/25/2020 Bone Density: ordered today Recommended yearly ophthalmology/optometry visit for glaucoma screening and checkup Recommended yearly dental visit for hygiene and checkup  Vaccinations: Influenza vaccine: completed 07/26/2020, due 06/18/2021 Pneumococcal vaccine: completed 02/02/2019 Tdap vaccine: completed 02/16/2018, due 02/17/2028 Shingles vaccine: discussed   Covid-19: 08/14/2020, 01/13/2020, 12/23/2019  Advanced directives: Advance directive discussed with you today. Even though you declined this today please call our office should you change your mind and we can give you the proper paperwork for you to fill out.  Conditions/risks identified: none  Next appointment: Follow up in one year for your annual wellness visit    Preventive Care 65 Years and Older, Female Preventive care refers to lifestyle choices and visits with your health care provider that can promote health and wellness. What does preventive care include?  A yearly physical exam. This is also called an annual well check.  Dental exams once or twice a year.  Routine eye exams. Ask your health care provider how often you should have your eyes checked.  Personal lifestyle choices, including:  Daily care of your teeth and gums.  Regular physical activity.  Eating a healthy diet.  Avoiding tobacco and drug use.  Limiting alcohol use.  Practicing safe sex.  Taking low-dose aspirin every day.  Taking vitamin and mineral supplements as recommended by your health care provider. What happens during an annual well check? The services and screenings done by your health care provider during  your annual well check will depend on your age, overall health, lifestyle risk factors, and family history of disease. Counseling  Your health care provider may ask you questions about your:  Alcohol use.  Tobacco use.  Drug use.  Emotional well-being.  Home and relationship well-being.  Sexual activity.  Eating habits.  History of falls.  Memory and ability to understand (cognition).  Work and work Statistician.  Reproductive health. Screening  You may have the following tests or measurements:  Height, weight, and BMI.  Blood pressure.  Lipid and cholesterol levels. These may be checked every 5 years, or more frequently if you are over 74 years old.  Skin check.  Lung cancer screening. You may have this screening every year starting at age 13 if you have a 30-pack-year history of smoking and currently smoke or have quit within the past 15 years.  Fecal occult blood test (FOBT) of the stool. You may have this test every year starting at age 85.  Flexible sigmoidoscopy or colonoscopy. You may have a sigmoidoscopy every 5 years or a colonoscopy every 10 years starting at age 89.  Hepatitis C blood test.  Hepatitis B blood test.  Sexually transmitted disease (STD) testing.  Diabetes screening. This is done by checking your blood sugar (glucose) after you have not eaten for a while (fasting). You may have this done every 1-3 years.  Bone density scan. This is done to screen for osteoporosis. You may have this done starting at age 67.  Mammogram. This may be done every 1-2 years. Talk to your health care provider about how often you should have regular mammograms. Talk with your health care provider about your test results, treatment options, and if necessary, the need for  more tests. Vaccines  Your health care provider may recommend certain vaccines, such as:  Influenza vaccine. This is recommended every year.  Tetanus, diphtheria, and acellular pertussis (Tdap,  Td) vaccine. You may need a Td booster every 10 years.  Zoster vaccine. You may need this after age 39.  Pneumococcal 13-valent conjugate (PCV13) vaccine. One dose is recommended after age 45.  Pneumococcal polysaccharide (PPSV23) vaccine. One dose is recommended after age 53. Talk to your health care provider about which screenings and vaccines you need and how often you need them. This information is not intended to replace advice given to you by your health care provider. Make sure you discuss any questions you have with your health care provider. Document Released: 12/01/2015 Document Revised: 07/24/2016 Document Reviewed: 09/05/2015 Elsevier Interactive Patient Education  2017 [employer] Prevention in the Home Falls can cause injuries. They can happen to people of all ages. There are many things you can do to make your home safe and to help prevent falls. What can I do on the outside of my home?  Regularly fix the edges of walkways and driveways and fix any cracks.  Remove anything that might make you trip as you walk through a door, such as a raised step or threshold.  Trim any bushes or trees on the path to your home.  Use bright outdoor lighting.  Clear any walking paths of anything that might make someone trip, such as rocks or tools.  Regularly check to see if handrails are loose or broken. Make sure that both sides of any steps have handrails.  Any raised decks and porches should have guardrails on the edges.  Have any leaves, snow, or ice cleared regularly.  Use sand or salt on walking paths during winter.  Clean up any spills in your garage right away. This includes oil or grease spills. What can I do in the bathroom?  Use night lights.  Install grab bars by the toilet and in the tub and shower. Do not use towel bars as grab bars.  Use non-skid mats or decals in the tub or shower.  If you need to sit down in the shower, use a plastic, non-slip  stool.  Keep the floor dry. Clean up any water that spills on the floor as soon as it happens.  Remove soap buildup in the tub or shower regularly.  Attach bath mats securely with double-sided non-slip rug tape.  Do not have throw rugs and other things on the floor that can make you trip. What can I do in the bedroom?  Use night lights.  Make sure that you have a light by your bed that is easy to reach.  Do not use any sheets or blankets that are too big for your bed. They should not hang down onto the floor.  Have a firm chair that has side arms. You can use this for support while you get dressed.  Do not have throw rugs and other things on the floor that can make you trip. What can I do in the kitchen?  Clean up any spills right away.  Avoid walking on wet floors.  Keep items that you use a lot in easy-to-reach places.  If you need to reach something above you, use a strong step stool that has a grab bar.  Keep electrical cords out of the way.  Do not use floor polish or wax that makes floors slippery. If you must use wax, use non-skid  floor wax.  Do not have throw rugs and other things on the floor that can make you trip. What can I do with my stairs?  Do not leave any items on the stairs.  Make sure that there are handrails on both sides of the stairs and use them. Fix handrails that are broken or loose. Make sure that handrails are as long as the stairways.  Check any carpeting to make sure that it is firmly attached to the stairs. Fix any carpet that is loose or worn.  Avoid having throw rugs at the top or bottom of the stairs. If you do have throw rugs, attach them to the floor with carpet tape.  Make sure that you have a light switch at the top of the stairs and the bottom of the stairs. If you do not have them, ask someone to add them for you. What else can I do to help prevent falls?  Wear shoes that:  Do not have high heels.  Have rubber bottoms.  Are  comfortable and fit you well.  Are closed at the toe. Do not wear sandals.  If you use a stepladder:  Make sure that it is fully opened. Do not climb a closed stepladder.  Make sure that both sides of the stepladder are locked into place.  Ask someone to hold it for you, if possible.  Clearly mark and make sure that you can see:  Any grab bars or handrails.  First and last steps.  Where the edge of each step is.  Use tools that help you move around (mobility aids) if they are needed. These include:  Canes.  Walkers.  Scooters.  Crutches.  Turn on the lights when you go into a dark area. Replace any light bulbs as soon as they burn out.  Set up your furniture so you have a clear path. Avoid moving your furniture around.  If any of your floors are uneven, fix them.  If there are any pets around you, be aware of where they are.  Review your medicines with your doctor. Some medicines can make you feel dizzy. This can increase your chance of falling. Ask your doctor what other things that you can do to help prevent falls. This information is not intended to replace advice given to you by your health care provider. Make sure you discuss any questions you have with your health care provider. Document Released: 08/31/2009 Document Revised: 04/11/2016 Document Reviewed: 12/09/2014 Elsevier Interactive Patient Education  2017 [employer].

## 2021-04-19 NOTE — Progress Notes (Signed)
This visit occurred during the SARS-CoV-2 public health emergency.  Safety protocols were in place, including screening questions prior to the visit, additional usage of staff PPE, and extensive cleaning of exam room while observing appropriate contact time as indicated for disinfecting solutions.  Subjective:   [name] is a 75 y.o. female who presents for Medicare Annual (Subsequent) preventive examination.  Review of Systems     Cardiac Risk Factors include: advanced age (>83men, >43 women);hypertension;obesity (BMI >30kg/m2);sedentary lifestyle     Objective:    Today's Vitals   04/19/21 1007 04/19/21 1014  BP: 120/72   Pulse: 71   Temp: 98.1 F (36.7 C)   TempSrc: Oral   PainSc:  3    There is no height or weight on file to calculate BMI.  Advanced Directives 04/19/2021 07/20/2020 07/15/2019 02/09/2019  Does Patient Have a Medical Advance Directive? No No No No  Would patient like information on creating a medical advance directive? No - Patient declined - - -    Current Medications (verified) Outpatient Encounter Medications as of 04/19/2021  Medication Sig  . atorvastatin (LIPITOR) 10 MG tablet Take 1 tablet (10 mg total) by mouth daily.  . hydrochlorothiazide (HYDRODIURIL) 25 MG tablet Take 1 tablet (25 mg total) by mouth daily.  [name] lisinopril (ZESTRIL) 20 MG tablet Take 1 tablet (20 mg total) by mouth daily.  [name] Zoster Vaccine Adjuvanted [facility] injection Inject 0.5 mLs into the muscle once for 1 dose.  [name] acetaminophen (TYLENOL) 500 MG tablet Take 1,000 mg by mouth daily as needed for moderate pain. (Patient not taking: Reported on 04/19/2021)  . Ascorbic Acid (VITAMIN C) 1000 MG tablet Take 1,000 mg by mouth daily. (Patient not taking: Reported on 04/19/2021)  . ASTRAGALUS PO Take by mouth. (Patient not taking: No sig reported)  . CALCIUM PO Take by mouth. (Patient not taking: Reported on 04/19/2021)  . COLLAGEN PO Take by mouth. (Patient not taking: Reported on  04/19/2021)  . Cyanocobalamin (VITAMIN B 12 PO) Take by mouth. (Patient not taking: Reported on 04/19/2021)  . diazepam (VALIUM) 2 MG tablet Take one tab one hour prior to procedure, repeat in one hour if needed (Patient not taking: Reported on 04/19/2021)  . Diclofenac Sodium 2 % SOLN Place onto the skin. (Patient not taking: Reported on 04/19/2021)  . doxycycline (MONODOX) 100 MG capsule Take 1 capsule (100 mg total) by mouth 2 (two) times daily. (Patient not taking: Reported on 04/19/2021)  . ECHINACEA EXTRACT PO Take by mouth. (Patient not taking: Reported on 04/19/2021)  . Magnesium 100 MG CAPS Take by mouth. (Patient not taking: Reported on 04/19/2021)  . Multiple Vitamin (MULTIVITAMIN) capsule Take 1 capsule by mouth daily. (Patient not taking: Reported on 04/19/2021)  . Pyridoxine HCl (VITAMIN B6 PO) Take by mouth. (Patient not taking: Reported on 04/19/2021)  . terbinafine (LAMISIL) 250 MG tablet Take 1 tablet (250 mg total) by mouth daily. (Patient not taking: Reported on 04/19/2021)  . topiramate (TOPAMAX) 25 MG tablet Take 1 tablet (25 mg total) by mouth daily with supper. (Patient not taking: Reported on 04/19/2021)  . VITAMIN D PO Take by mouth. (Patient not taking: Reported on 04/19/2021)  . Vitamin D, Ergocalciferol, (DRISDOL) 1.25 MG (50000 UNIT) CAPS capsule Take 1 capsule (50,000 Units total) by mouth every 7 (seven) days. (Patient not taking: Reported on 04/19/2021)  . Zinc 30 MG CAPS Take by mouth. (Patient not taking: Reported on 04/19/2021)   No facility-administered encounter medications on file as of  04/19/2021.    Allergies (verified) Penicillins   History: Past Medical History:  Diagnosis Date  . Back pain   . Falls   . Fibromyalgia   . Hypertension   . Joint pain   . Joint stiffness   . Joint swelling   . Pure hypercholesterolemia 01/11/2020  . Raynaud's disease    age 60's  . Sleep apnea   . Varicose veins of both lower extremities   . Vitamin B12 deficiency    2020  . Vitamin D  deficiency    Past Surgical History:  Procedure Laterality Date  . BREAST BIOPSY Right   . BREAST EXCISIONAL BIOPSY Right   . FOOT FRACTURE SURGERY Right   . KNEE SURGERY Left 2007  . TUBAL LIGATION     Family History  Problem Relation Age of Onset  . Hypertension Mother   . Hyperthyroidism Mother   . Hypertension Father   . Prostate cancer Father   . Diabetes Brother   . Breast cancer Paternal Aunt   . Breast cancer Paternal Grandmother   . Peripheral vascular disease Other    Social History   Socioeconomic History  . Marital status: Single    Spouse name: Not on file  . Number of children: Not on file  . Years of education: Not on file  . Highest education level: Not on file  Occupational History  . Not on file  Tobacco Use  . Smoking status: Former Research scientist (life sciences)  . Smokeless tobacco: Never Used  . Tobacco comment: quit at age 34,only smoked for 1-2 years  Vaping Use  . Vaping Use: Never used  Substance and Sexual Activity  . Alcohol use: Yes    Comment: 2 drinks weekly  . Drug use: No  . Sexual activity: Not Currently  Other Topics Concern  . Not on file  Social History Narrative  . Not on file   Social Determinants of Health   Financial Resource Strain: Low Risk   . Difficulty of Paying Living Expenses: Not hard at all  Food Insecurity: No Food Insecurity  . Worried About Charity fundraiser in the Last Year: Never true  . Ran Out of Food in the Last Year: Never true  Transportation Needs: No Transportation Needs  . Lack of Transportation (Medical): No  . Lack of Transportation (Non-Medical): No  Physical Activity: Inactive  . Days of Exercise per Week: 0 days  . Minutes of Exercise per Session: 0 min  Stress: No Stress Concern Present  . Feeling of Stress : Only a little  Social Connections: Not on file    Tobacco Counseling Counseling given: Not Answered Comment: quit at age 34,only smoked for 1-2 years   Clinical Intake:  Pre-visit preparation  completed: Yes  Pain : 0-10 Pain Score: 3  Pain Type: Chronic pain Pain Location: Buttocks Pain Orientation: Right Pain Descriptors / Indicators: Throbbing (both little toes, constant, right thumb sharp constant pain, left knee, lower back constant) Pain Onset: More than a month ago Pain Frequency: Constant Pain Relieving Factors: therapeutic massage  Pain Relieving Factors: therapeutic massage  Nutritional Status: BMI > 30  Obese Nutritional Risks: None Diabetes: No  How often do you need to have someone help you when you read instructions, pamphlets, or other written materials from your doctor or pharmacy?: 1 - Never What is the last grade level you completed in school?: advanced studies  Diabetic? no  Interpreter Needed?: No  Information entered by :: NAllen LPN  Activities of Daily Living In your present state of health, do you have any difficulty performing the following activities: 04/19/2021 07/20/2020  Hearing? N N  Vision? N Y  Comment - has floaters  Difficulty concentrating or making decisions? N N  Walking or climbing stairs? Y Y  Dressing or bathing? N N  Doing errands, shopping? N N  Preparing Food and eating ? N N  Using the Toilet? N N  In the past six months, have you accidently leaked urine? N N  Do you have problems with loss of bowel control? N N  Managing your Medications? N N  Managing your Finances? N N  Housekeeping or managing your Housekeeping? N N  Some recent data might be hidden    Patient Care Team: Glendale Chard, MD as PCP - General (Internal Medicine) Skeet Latch, MD as PCP - Cardiology (Cardiology)  Indicate any recent Medical Services you may have received from other than [facility] providers in the past year (date may be approximate).     Assessment:   This is a routine wellness examination for [place].  Hearing/Vision screen  Hearing Screening   125Hz  250Hz  500Hz  1000Hz  2000Hz  3000Hz  4000Hz  6000Hz  8000Hz   Right ear:            Left ear:           Vision Screening Comments: Regular eye exams,  Dietary issues and exercise activities discussed: Current Exercise Habits: The patient does not participate in regular exercise at present  Goals Addressed            This Visit's Progress   . Patient Stated       04/19/2021, lose weight, stabilize CKD, work on overall wellness      Depression Screen PHQ 2/9 Scores 04/19/2021 07/20/2020 04/18/2020 12/09/2019 07/15/2019 07/08/2019 06/30/2019  PHQ - 2 Score 0 0 2 0 0 0 0  PHQ- 9 Score - - 8 - 1 - -    Fall Risk Fall Risk  04/19/2021 07/20/2020 12/09/2019 07/15/2019 07/08/2019  Falls in the past year? 0 0 0 0 0  Number falls in past yr: - - - - -  Injury with Fall? - - - - -  Risk for fall due to : Medication side effect Medication side effect - Medication side effect -  Follow up Falls evaluation completed;Education provided;Falls prevention discussed Falls evaluation completed;Education provided;Falls prevention discussed - Falls evaluation completed;Education provided;Falls prevention discussed -    FALL RISK PREVENTION PERTAINING TO THE HOME:  Any stairs in or around the home? Yes  If so, are there any without handrails? No  Home free of loose throw rugs in walkways, pet beds, electrical cords, etc? Yes  Adequate lighting in your home to reduce risk of falls? Yes   ASSISTIVE DEVICES UTILIZED TO PREVENT FALLS:  Life alert? No  Use of a cane, walker or w/c? No  Grab bars in the bathroom? No  Shower chair or bench in shower? No  Elevated toilet seat or a handicapped toilet? Yes   TIMED UP AND GO:  Was the test performed? No .   Gait steady and fast without use of assistive device  Cognitive Function:     6CIT Screen 07/20/2020 07/15/2019  What Year? 0 points 0 points  What month? 0 points 0 points  What time? 0 points 0 points  Count back from 20 0 points 0 points  Months in reverse 0 points 0 points  Repeat phrase 0 points 0 points  Total Score 0 0     Immunizations Immunization History  Administered Date(s) Administered  . DTaP 06/10/2013  . Fluad Quad(high Dose 65+) 07/26/2020  . Influenza Inj Mdck Quad Pf 07/21/2019  . Influenza, High Dose Seasonal PF 09/11/2018  . Influenza-Unspecified 07/21/2019  . PFIZER(Purple Top)SARS-COV-2 Vaccination 12/23/2019, 01/13/2020, 08/14/2020  . Pneumococcal Conjugate-13 02/02/2019  . Pneumococcal-Unspecified 06/10/2013  . Tdap 02/16/2018    TDAP status: Up to date  Flu Vaccine status: Up to date  Pneumococcal vaccine status: Up to date  Covid-19 vaccine status: Completed vaccines  Qualifies for Shingles Vaccine? Yes   Zostavax completed No   Shingrix Completed?: No.    Education has been provided regarding the importance of this vaccine. Patient has been advised to call insurance company to determine out of pocket expense if they have not yet received this vaccine. Advised may also receive vaccine at local pharmacy or Health Dept. Verbalized acceptance and understanding.  Screening Tests Health Maintenance  Topic Date Due  . Zoster Vaccines- Shingrix (1 of 2) Never done  . COVID-19 Vaccine (4 - Booster for Pfizer series) 11/13/2020  . INFLUENZA VACCINE  06/18/2021  . MAMMOGRAM  09/25/2022  . COLONOSCOPY (Pts 45-40yrs Insurance coverage will need to be confirmed)  06/06/2027  . TETANUS/TDAP  02/17/2028  . DEXA SCAN  Completed  . Hepatitis C Screening  Completed  . PNA vac Low Risk Adult  Completed  . HPV VACCINES  Aged Out    Health Maintenance  Health Maintenance Due  Topic Date Due  . Zoster Vaccines- Shingrix (1 of 2) Never done  . COVID-19 Vaccine (4 - Booster for Pfizer series) 11/13/2020    Colorectal cancer screening: Type of screening: Colonoscopy. Completed 7/189/2018. Repeat every 10 years  Mammogram status: Completed 09/25/2020. Repeat every year  Bone Density status: Completed 05/20/2017.   Lung Cancer Screening: (Low Dose CT Chest recommended if Age 57-80  years, 30 pack-year currently smoking OR have quit w/in 15years.) does not qualify.   Lung Cancer Screening Referral: no  Additional Screening:  Hepatitis C Screening: does qualify; Completed 05/05/2017   Vision Screening: Recommended annual ophthalmology exams for early detection of glaucoma and other disorders of the eye. Is the patient up to date with their annual eye exam?  Yes  Who is the provider or what is the name of the office in which the patient attends annual eye exams?  If pt is not established with a provider, would they like to be referred to a provider to establish care? No .   Dental Screening: Recommended annual dental exams for proper oral hygiene  Community Resource Referral / Chronic Care Management: CRR required this visit?  No   CCM required this visit?  No      Plan:     I have personally reviewed and noted the following in the patient's chart:   . Medical and social history . Use of alcohol, tobacco or illicit drugs  . Current medications and supplements including opioid prescriptions.  . Functional ability and status . Nutritional status . Physical activity . Advanced directives . List of other physicians . Hospitalizations, surgeries, and ER visits in previous 12 months . Vitals . Screenings to include cognitive, depression, and falls . Referrals and appointments  In addition, I have reviewed and discussed with patient certain preventive protocols, quality metrics, and best practice recommendations. A written personalized care plan for preventive services as well as general preventive health recommendations were provided to patient.  Kellie Simmering, LPN   06/19/5052   Nurse Notes: 6 CIT was not performed. Patient is cognitive per direct observation.

## 2021-04-24 ENCOUNTER — Encounter: Payer: Self-pay | Admitting: Internal Medicine

## 2021-04-26 ENCOUNTER — Other Ambulatory Visit: Payer: Self-pay

## 2021-04-26 MED ORDER — LISINOPRIL 10 MG PO TABS: 10.0000 mg | ORAL_TABLET | Freq: Every day | ORAL | 1 refills | Status: DC

## 2021-04-30 ENCOUNTER — Other Ambulatory Visit: Payer: Self-pay

## 2021-04-30 DIAGNOSIS — N1831 Chronic kidney disease, stage 3a: Secondary | ICD-10-CM

## 2021-05-16 ENCOUNTER — Encounter: Payer: Self-pay | Admitting: Internal Medicine

## 2021-05-16 ENCOUNTER — Other Ambulatory Visit: Payer: Self-pay

## 2021-05-16 ENCOUNTER — Ambulatory Visit (INDEPENDENT_AMBULATORY_CARE_PROVIDER_SITE_OTHER): Payer: Medicare HMO | Admitting: Internal Medicine

## 2021-05-16 VITALS — BP 118/66 | HR 92 | Temp 98.2°F | Ht 67.0 in

## 2021-05-16 DIAGNOSIS — M79672 Pain in left foot: Secondary | ICD-10-CM | POA: Diagnosis not present

## 2021-05-16 DIAGNOSIS — I129 Hypertensive chronic kidney disease with stage 1 through stage 4 chronic kidney disease, or unspecified chronic kidney disease: Secondary | ICD-10-CM | POA: Diagnosis not present

## 2021-05-16 DIAGNOSIS — I739 Peripheral vascular disease, unspecified: Secondary | ICD-10-CM

## 2021-05-16 DIAGNOSIS — N182 Chronic kidney disease, stage 2 (mild): Secondary | ICD-10-CM | POA: Diagnosis not present

## 2021-05-16 DIAGNOSIS — Z23 Encounter for immunization: Secondary | ICD-10-CM | POA: Diagnosis not present

## 2021-05-16 DIAGNOSIS — M1712 Unilateral primary osteoarthritis, left knee: Secondary | ICD-10-CM | POA: Diagnosis not present

## 2021-05-16 DIAGNOSIS — N1831 Chronic kidney disease, stage 3a: Secondary | ICD-10-CM | POA: Diagnosis not present

## 2021-05-16 DIAGNOSIS — M79671 Pain in right foot: Secondary | ICD-10-CM

## 2021-05-16 NOTE — Progress Notes (Addendum)
I,[name],acting as a Education administrator for Emma Greenland, MD.,have documented all relevant documentation on the behalf of Emma Greenland, MD,as directed by  Emma Greenland, MD while in the presence of Emma Greenland, MD.  This visit occurred during the SARS-CoV-2 public health emergency.  Safety protocols were in place, including screening questions prior to the visit, additional usage of staff PPE, and extensive cleaning of exam room while observing appropriate contact time as indicated for disinfecting solutions.  Subjective:     Patient ID: [name] , female    DOB: 1946-01-11 , 75 y.o.   MRN: [id number]   Chief Complaint  Patient presents with   Leg Pain    HPI  The patient is here today for evaluation of left leg pain and bilateral feet pain.  She was recently evaluated for a home health screening for her insurance company. NP performed PAD screen which was positive for decreased reading on the left. She does admit to calf pain with ambulation. However, she notes that Dr. Oval Gordon did a full workup of her circulation in Jan 2021 and her results were normal. She also c/o b/l foot pain. Denies fall/trauma. She also has knee OA and is hesitant to pursue knee replacement. She prefers to consider PRP therapy. Once ready, she will seek this service from Dr. Neomia Gordon.     Past Medical History:  Diagnosis Date   Back pain    Falls    Fibromyalgia    Hypertension    Joint pain    Joint stiffness    Joint swelling    Pure hypercholesterolemia 01/11/2020   Raynaud's disease    age 5's   Sleep apnea    Varicose veins of both lower extremities    Vitamin B12 deficiency    2020   Vitamin D deficiency      Family History  Problem Relation Age of Onset   Hypertension Mother    Hyperthyroidism Mother    Hypertension Father    Prostate cancer Father    Diabetes Brother    Breast cancer Paternal Aunt    Breast cancer Paternal Grandmother    Peripheral vascular  disease Other      Current Outpatient Medications:    acetaminophen (TYLENOL) 500 MG tablet, Take 1,000 mg by mouth daily as needed for moderate pain., Disp: , Rfl:    atorvastatin (LIPITOR) 10 MG tablet, TAKE 1 TABLET(10 MG) BY MOUTH DAILY, Disp: 90 tablet, Rfl: 1   hydrochlorothiazide (HYDRODIURIL) 25 MG tablet, TAKE 1 TABLET(25 MG) BY MOUTH DAILY, Disp: 30 tablet, Rfl: 0   lisinopril (ZESTRIL) 10 MG tablet, Take 1 tablet (10 mg total) by mouth daily., Disp: 90 tablet, Rfl: 1   Multiple Vitamin (MULTIVITAMIN) capsule, Take 1 capsule by mouth daily., Disp: , Rfl:    pantoprazole (PROTONIX) 40 MG tablet, TAKE 1 TABLET(40 MG) BY MOUTH DAILY, Disp: 30 tablet, Rfl: 0   Allergies  Allergen Reactions   Penicillins     Does not remember     Review of Systems  Constitutional: Negative.   Respiratory: Negative.    Cardiovascular: Negative.   Gastrointestinal: Negative.   Musculoskeletal:  Positive for arthralgias.  Psychiatric/Behavioral: Negative.    All other systems reviewed and are negative.   Today's Vitals   05/16/21 1425  BP: 118/66  Pulse: 92  Temp: 98.2 F (36.8 C)  TempSrc: Oral  Height: 5' 7"  (1.702 m)  PainSc: 7    Body mass index is 37.59 kg/m.  Wt Readings from Last 3 Encounters:  01/07/22 226 lb 8 oz (102.7 kg)  11/28/21 215 lb (97.5 kg)  05/29/20 240 lb (108.9 kg)    BP Readings from Last 3 Encounters:  02/19/22 118/76  01/07/22 (!) 147/82  12/10/21 114/82    Objective:  Physical Exam Vitals and nursing note reviewed.  Constitutional:      Appearance: Normal appearance.  HENT:     Head: Normocephalic and atraumatic.     Nose:     Comments: Masked     Mouth/Throat:     Comments: Masked  Cardiovascular:     Rate and Rhythm: Normal rate and regular rhythm.     Heart sounds: Normal heart sounds.  Pulmonary:     Effort: Pulmonary effort is normal.     Breath sounds: Normal breath sounds.  Skin:    General: Skin is warm.  Neurological:      General: No focal deficit present.     Mental Status: She is alert.  Psychiatric:        Mood and Affect: Mood normal.        Behavior: Behavior normal.        Assessment And Plan:     1. Claudication of left lower extremity ([name]) Comments: PAD screening results reviewed. She does not wish to pursue Vascular evaluation at this time. Encouraged to follow heart healthy diet and stay active.   2. Primary osteoarthritis of left knee Comments: I agree that PRP therapy can be helpful. She is encouraged to follow anti-inflammatory diet.   3. Parenchymal renal hypertension, stage 1 through stage 4 or unspecified chronic kidney disease Comments: Chronic, well controlled. She is encouraged to limit her intake of salty foods.  4. Chronic kidney disease, stage II (mild) Comments: Chronic. Most recent reading in stage 3 range. I will recheck this today. I will also check u/a and microalbumin.  - POCT Urinalysis Dipstick (81002) - POCT UA - Microalbumin - BMP8+eGFR  5. Bilateral foot pain Comments: I will refer her to Podiatry for further evaluation.  - Ambulatory referral to Podiatry  6. Immunization due Comments: I will send rx Shingrix to her local pharmacy.      Patient was given opportunity to ask questions. Patient verbalized understanding of the plan and was able to repeat key elements of the plan. All questions were answered to their satisfaction.   I, Emma Greenland, MD, have reviewed all documentation for this visit. The documentation on 05/17/21 for the exam, diagnosis, procedures, and orders are all accurate and complete.   IF YOU HAVE BEEN REFERRED TO A SPECIALIST, IT MAY TAKE 1-2 WEEKS TO SCHEDULE/PROCESS THE REFERRAL. IF YOU HAVE NOT HEARD FROM US/SPECIALIST IN TWO WEEKS, PLEASE GIVE [place] A CALL AT [phone].   THE PATIENT IS ENCOURAGED TO PRACTICE SOCIAL DISTANCING DUE TO THE COVID-19 PANDEMIC.

## 2021-05-17 ENCOUNTER — Encounter: Payer: Self-pay | Admitting: Internal Medicine

## 2021-05-17 LAB — BMP8+EGFR
BUN/Creatinine Ratio: 16 (ref 12–28)
BUN: 20 mg/dL (ref 8–27)
CO2: 23 mmol/L (ref 20–29)
Calcium: 9.6 mg/dL (ref 8.7–10.3)
Chloride: 100 mmol/L (ref 96–106)
Creatinine, Ser: 1.22 mg/dL — ABNORMAL HIGH (ref 0.57–1.00)
Glucose: 104 mg/dL — ABNORMAL HIGH (ref 65–99)
Potassium: 4.4 mmol/L (ref 3.5–5.2)
Sodium: 138 mmol/L (ref 134–144)
eGFR: 47 mL/min/{1.73_m2} — ABNORMAL LOW (ref >59)

## 2021-05-17 LAB — POCT URINALYSIS DIPSTICK
Bilirubin, UA: NEGATIVE
Blood, UA: NEGATIVE
Glucose, UA: NEGATIVE
Ketones, UA: NEGATIVE
Leukocytes, UA: NEGATIVE
Nitrite, UA: NEGATIVE
Protein, UA: NEGATIVE
Spec Grav, UA: 1.015 (ref 1.010–1.025)
Urobilinogen, UA: 0.2 E.U./dL
pH, UA: 7 (ref 5.0–8.0)

## 2021-05-17 LAB — POCT UA - MICROALBUMIN
Albumin/Creatinine Ratio, Urine, POC: <30
Creatinine, POC: 100 mg/dL
Microalbumin Ur, POC: 10 mg/L

## 2021-05-23 LAB — PROTEIN ELECTROPHORESIS
A/G Ratio: 1.2 (ref 0.7–1.7)
Albumin ELP: 3.8 g/dL (ref 2.9–4.4)
Alpha 1: 0.3 g/dL (ref 0.0–0.4)
Alpha 2: 0.8 g/dL (ref 0.4–1.0)
Beta: 0.9 g/dL (ref 0.7–1.3)
Gamma Globulin: 1.2 g/dL (ref 0.4–1.8)
Globulin, Total: 3.1 g/dL (ref 2.2–3.9)
Total Protein: 6.9 g/dL (ref 6.0–8.5)

## 2021-05-23 LAB — SPECIMEN STATUS REPORT

## 2021-05-28 ENCOUNTER — Encounter: Payer: Self-pay | Admitting: Podiatry

## 2021-05-28 ENCOUNTER — Other Ambulatory Visit: Payer: Self-pay | Admitting: Physical Medicine and Rehabilitation

## 2021-05-28 ENCOUNTER — Ambulatory Visit: Payer: Medicare HMO | Admitting: Podiatry

## 2021-05-28 ENCOUNTER — Ambulatory Visit
Admission: RE | Admit: 2021-05-28 | Discharge: 2021-05-28 | Disposition: A | Discharge status: Home / Self Care | Payer: Medicare HMO | Source: Ambulatory Visit | Attending: Physical Medicine and Rehabilitation | Admitting: Physical Medicine and Rehabilitation

## 2021-05-28 ENCOUNTER — Other Ambulatory Visit: Payer: Self-pay

## 2021-05-28 ENCOUNTER — Ambulatory Visit (INDEPENDENT_AMBULATORY_CARE_PROVIDER_SITE_OTHER): Payer: Medicare HMO

## 2021-05-28 DIAGNOSIS — M25562 Pain in left knee: Secondary | ICD-10-CM

## 2021-05-28 DIAGNOSIS — M1712 Unilateral primary osteoarthritis, left knee: Secondary | ICD-10-CM | POA: Diagnosis not present

## 2021-05-28 DIAGNOSIS — M79671 Pain in right foot: Secondary | ICD-10-CM | POA: Diagnosis not present

## 2021-05-28 DIAGNOSIS — M79672 Pain in left foot: Secondary | ICD-10-CM | POA: Diagnosis not present

## 2021-05-28 DIAGNOSIS — M778 Other enthesopathies, not elsewhere classified: Secondary | ICD-10-CM | POA: Diagnosis not present

## 2021-05-28 NOTE — Progress Notes (Signed)
Subjective:   Patient ID: [name], female   DOB: 75 y.o.   MRN: [id number]   HPI Patient presents stating she has been getting chronic pain on top of both feet and states that its been going on for a while but it is gotten worse recently.  She tries to wear looser shoes as best as possible   ROS      Objective:  Physical Exam  Neurovascular status intact with extensor of inflammation with tendinitis and midfoot inflammation pain noted bilateral     Assessment:  Extensor tendinitis bilateral with probability of arthritis     Plan:  Sterile prep injected the extensor complex bilateral 3 mg Kenalog 5 mg Xylocaine and advised on topical medications looser shoes reappoint as needed may ultimately require MRIs or other treatments  X-rays indicate that there is reactivity around the midfoot consistent with spurring occurring

## 2021-05-29 IMAGING — MG DIGITAL SCREENING BILATERAL MAMMOGRAM WITH TOMO AND CAD
8 series · 8 of 24 positions shown · non-contrast
Comparison: Previous exam(s).

CLINICAL DATA: Screening.

EXAM:
DIGITAL SCREENING BILATERAL MAMMOGRAM WITH TOMO AND CAD

[L CC synth-2D]
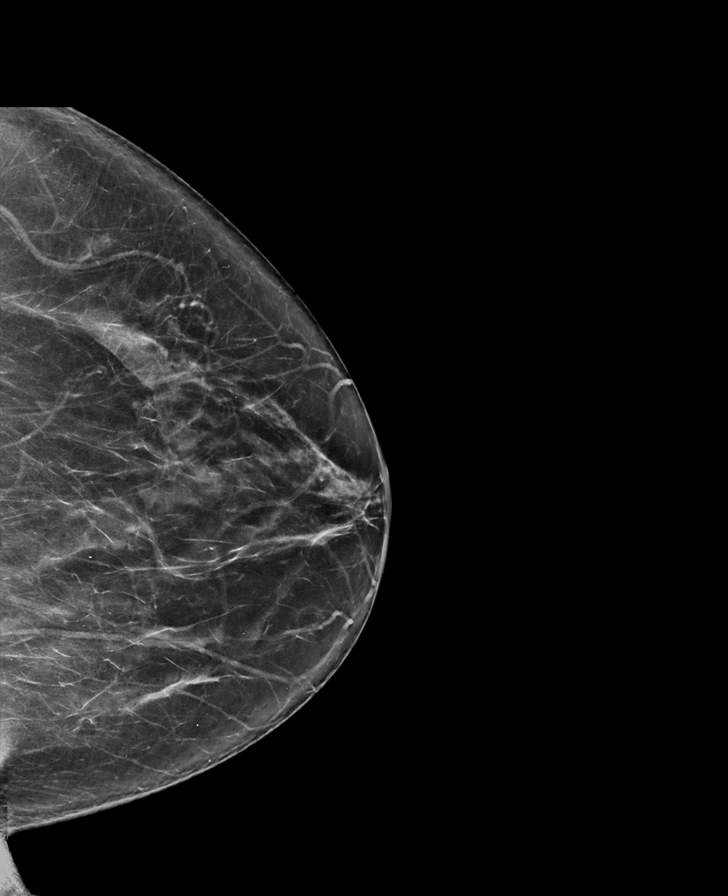

[R MLO synth-2D]
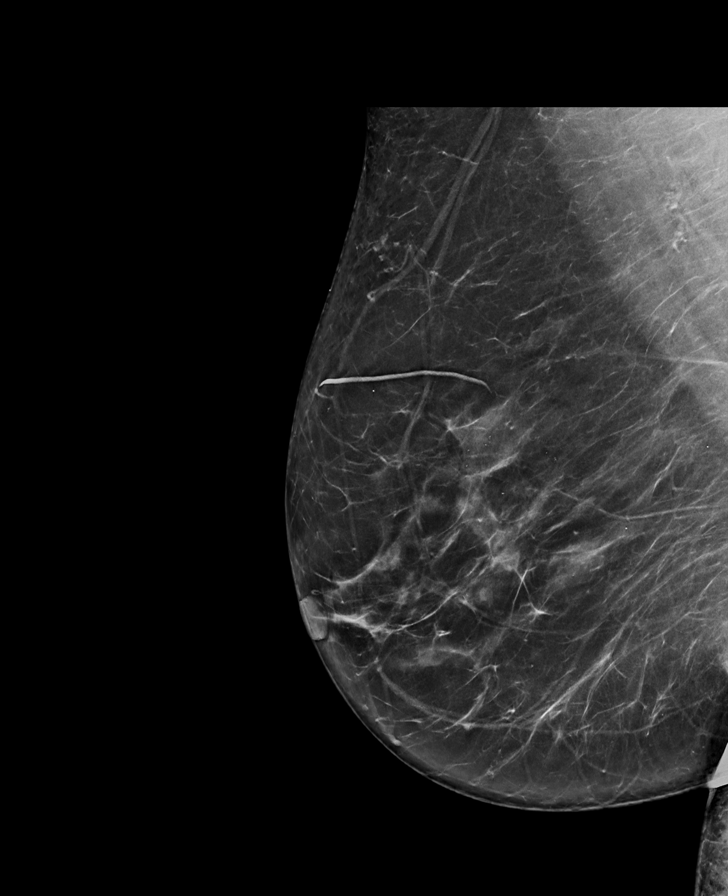

[R CC synth-2D]
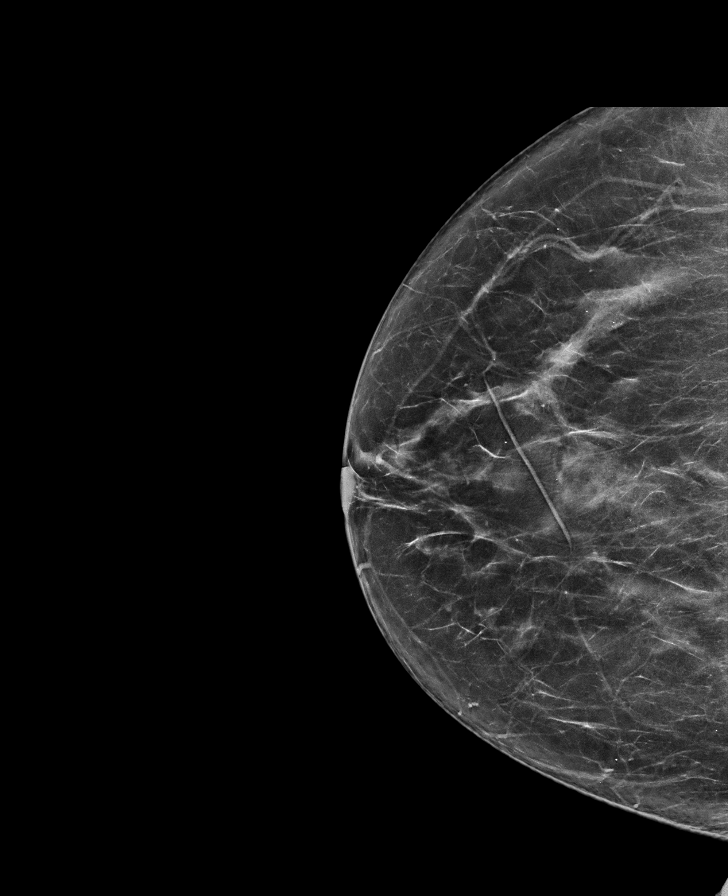

[L MLO synth-2D]
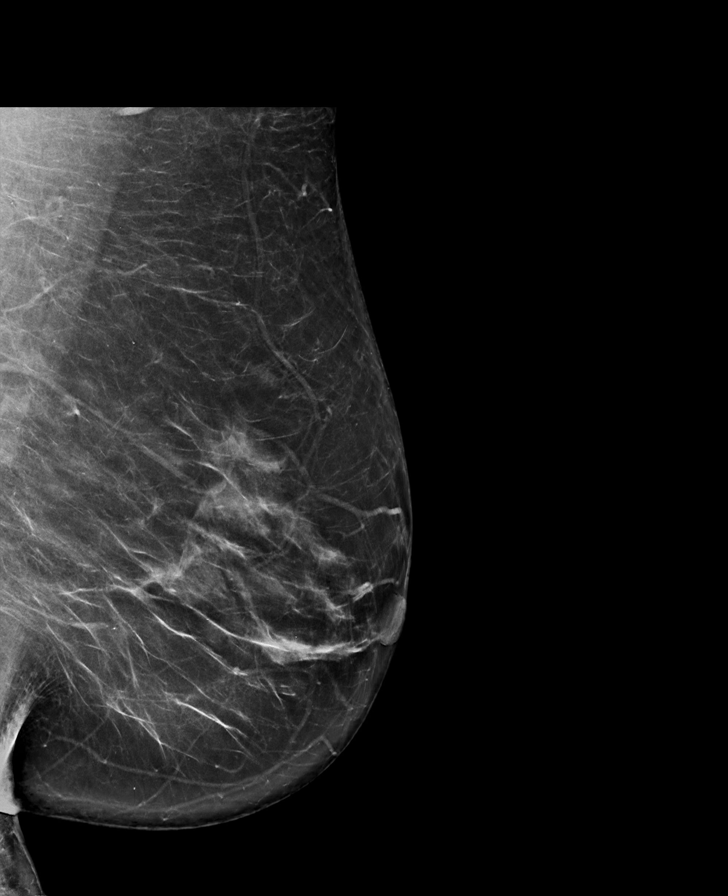

[L CC tomo · tomo slice 39/78.0]
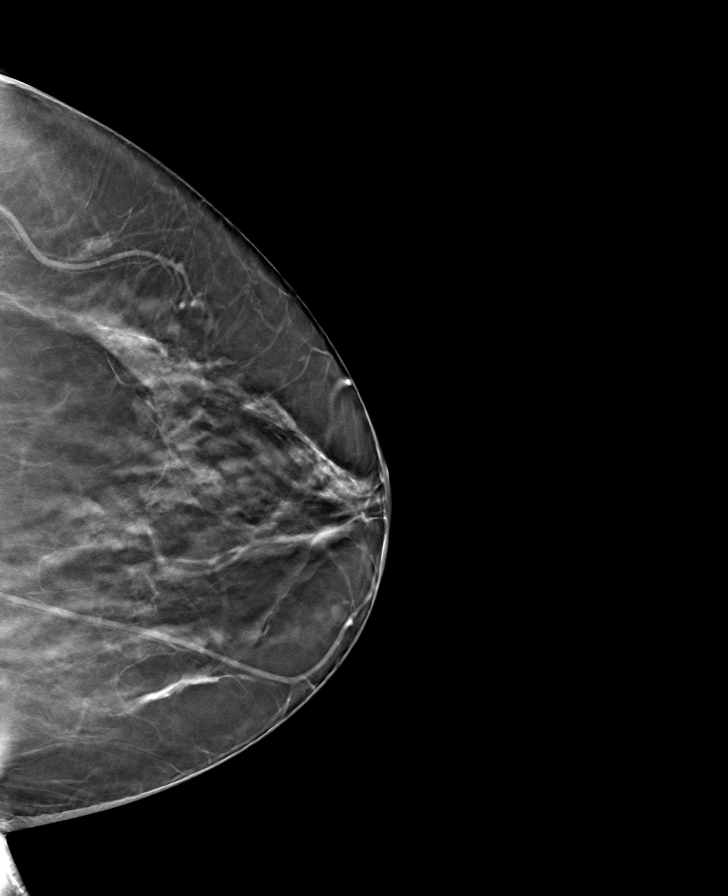

[R CC tomo · tomo slice 39/77.0]
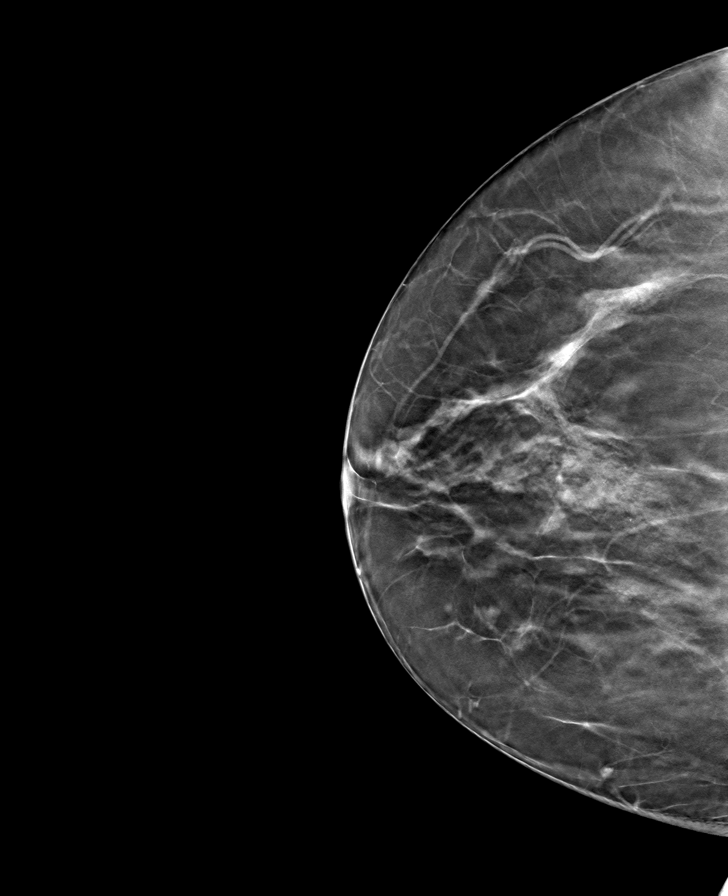

[R MLO tomo · tomo slice 44/87.0]
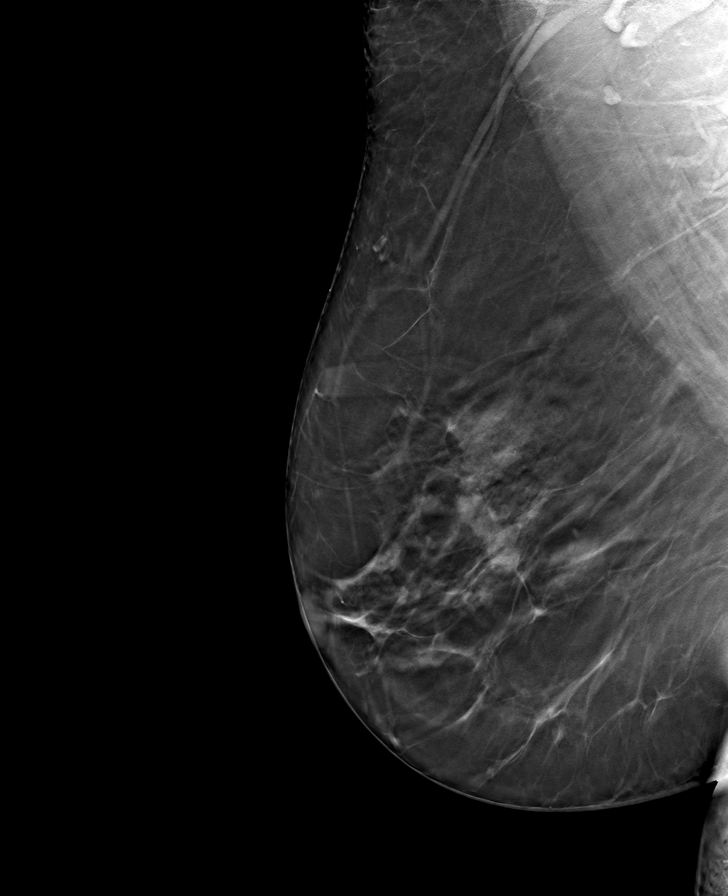

[L MLO tomo · tomo slice 45/88.0]
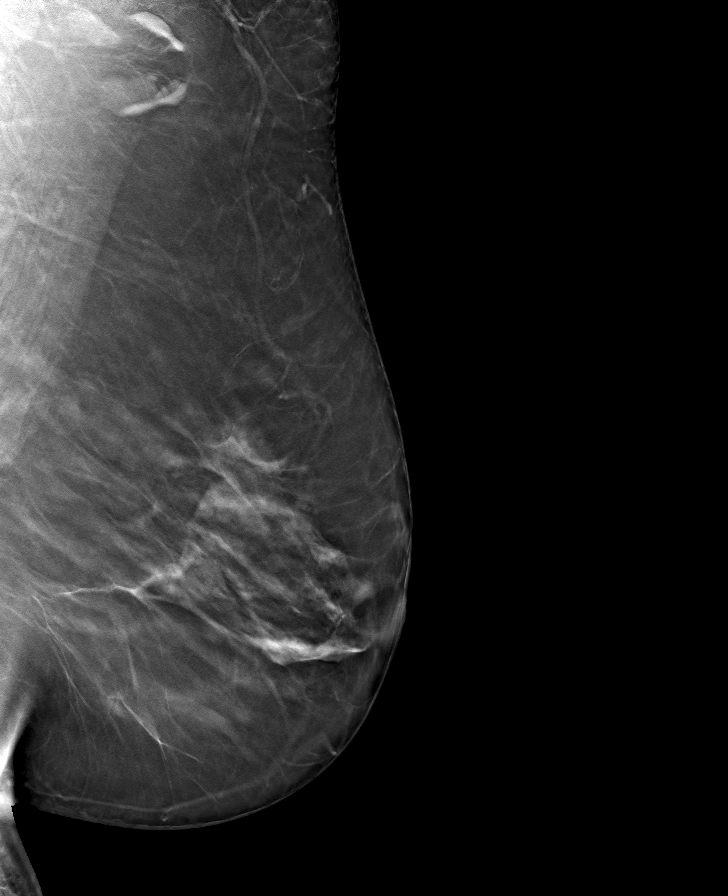

[8 of 24 positions shown; findings below may reference images not displayed]

ACR Breast Density Category b: There are scattered areas of
fibroglandular density.
FINDINGS: There are no findings suspicious for malignancy. Images were
processed with CAD.
IMPRESSION: No mammographic evidence of malignancy. A result letter of this
screening mammogram will be mailed directly to the patient.

RECOMMENDATION:
Screening mammogram in one year. (Code:[id number])

BI-RADS CATEGORY  1: Negative.

## 2021-05-30 ENCOUNTER — Other Ambulatory Visit: Payer: Self-pay | Admitting: Internal Medicine

## 2021-05-30 DIAGNOSIS — Z1231 Encounter for screening mammogram for malignant neoplasm of breast: Secondary | ICD-10-CM

## 2021-05-31 ENCOUNTER — Ambulatory Visit
Admission: RE | Admit: 2021-05-31 | Discharge: 2021-05-31 | Disposition: A | Discharge status: Home / Self Care | Payer: Medicare HMO | Source: Ambulatory Visit | Attending: Internal Medicine | Admitting: Internal Medicine

## 2021-05-31 ENCOUNTER — Other Ambulatory Visit: Payer: Self-pay

## 2021-05-31 DIAGNOSIS — M85852 Other specified disorders of bone density and structure, left thigh: Secondary | ICD-10-CM | POA: Diagnosis not present

## 2021-05-31 DIAGNOSIS — M810 Age-related osteoporosis without current pathological fracture: Secondary | ICD-10-CM | POA: Diagnosis not present

## 2021-05-31 DIAGNOSIS — Z780 Asymptomatic menopausal state: Secondary | ICD-10-CM | POA: Diagnosis not present

## 2021-05-31 DIAGNOSIS — E2839 Other primary ovarian failure: Secondary | ICD-10-CM

## 2021-06-07 ENCOUNTER — Encounter: Payer: Self-pay | Admitting: Internal Medicine

## 2021-06-08 ENCOUNTER — Other Ambulatory Visit: Payer: Self-pay | Admitting: Internal Medicine

## 2021-06-08 ENCOUNTER — Other Ambulatory Visit: Payer: Self-pay

## 2021-06-08 DIAGNOSIS — E559 Vitamin D deficiency, unspecified: Secondary | ICD-10-CM

## 2021-06-08 DIAGNOSIS — M816 Localized osteoporosis [Lequesne]: Secondary | ICD-10-CM

## 2021-06-08 MED ORDER — VITAMIN D (ERGOCALCIFEROL) 1.25 MG (50000 UNIT) PO CAPS: 50000.0000 [IU] | ORAL_CAPSULE | ORAL | 1 refills | Status: DC

## 2021-08-01 ENCOUNTER — Encounter: Payer: Medicare HMO | Admitting: Internal Medicine

## 2021-08-06 DIAGNOSIS — M797 Fibromyalgia: Secondary | ICD-10-CM | POA: Diagnosis not present

## 2021-08-06 DIAGNOSIS — E785 Hyperlipidemia, unspecified: Secondary | ICD-10-CM | POA: Diagnosis not present

## 2021-08-06 DIAGNOSIS — E669 Obesity, unspecified: Secondary | ICD-10-CM | POA: Diagnosis not present

## 2021-08-06 DIAGNOSIS — N1831 Chronic kidney disease, stage 3a: Secondary | ICD-10-CM | POA: Diagnosis not present

## 2021-08-06 DIAGNOSIS — I129 Hypertensive chronic kidney disease with stage 1 through stage 4 chronic kidney disease, or unspecified chronic kidney disease: Secondary | ICD-10-CM | POA: Diagnosis not present

## 2021-08-08 ENCOUNTER — Ambulatory Visit (INDEPENDENT_AMBULATORY_CARE_PROVIDER_SITE_OTHER): Payer: Medicare HMO | Admitting: Internal Medicine

## 2021-08-08 ENCOUNTER — Other Ambulatory Visit: Payer: Self-pay

## 2021-08-08 ENCOUNTER — Other Ambulatory Visit (HOSPITAL_COMMUNITY)
Admission: RE | Admit: 2021-08-08 | Discharge: 2021-08-08 | Disposition: A | Discharge status: Home / Self Care | Payer: Medicare HMO | Source: Ambulatory Visit | Attending: Internal Medicine | Admitting: Internal Medicine

## 2021-08-08 ENCOUNTER — Encounter: Payer: Self-pay | Admitting: Internal Medicine

## 2021-08-08 VITALS — BP 122/80 | HR 68 | Temp 98.6°F

## 2021-08-08 DIAGNOSIS — E7800 Pure hypercholesterolemia, unspecified: Secondary | ICD-10-CM

## 2021-08-08 DIAGNOSIS — Z23 Encounter for immunization: Secondary | ICD-10-CM | POA: Diagnosis not present

## 2021-08-08 DIAGNOSIS — Z01419 Encounter for gynecological examination (general) (routine) without abnormal findings: Secondary | ICD-10-CM

## 2021-08-08 DIAGNOSIS — Z0000 Encounter for general adult medical examination without abnormal findings: Secondary | ICD-10-CM

## 2021-08-08 DIAGNOSIS — N182 Chronic kidney disease, stage 2 (mild): Secondary | ICD-10-CM | POA: Insufficient documentation

## 2021-08-08 DIAGNOSIS — I129 Hypertensive chronic kidney disease with stage 1 through stage 4 chronic kidney disease, or unspecified chronic kidney disease: Secondary | ICD-10-CM | POA: Diagnosis not present

## 2021-08-08 DIAGNOSIS — R7309 Other abnormal glucose: Secondary | ICD-10-CM | POA: Insufficient documentation

## 2021-08-08 DIAGNOSIS — N183 Hypertensive chronic kidney disease with stage 1 through stage 4 chronic kidney disease, or unspecified chronic kidney disease: Secondary | ICD-10-CM | POA: Insufficient documentation

## 2021-08-08 DIAGNOSIS — N1831 Chronic kidney disease, stage 3a: Secondary | ICD-10-CM | POA: Diagnosis not present

## 2021-08-08 DIAGNOSIS — E559 Vitamin D deficiency, unspecified: Secondary | ICD-10-CM

## 2021-08-08 LAB — POC HEMOCCULT BLD/STL (OFFICE/1-CARD/DIAGNOSTIC): Fecal Occult Blood, POC: NEGATIVE

## 2021-08-08 MED ORDER — HYDROCHLOROTHIAZIDE 25 MG PO TABS: 25.0000 mg | ORAL_TABLET | Freq: Every day | ORAL | 1 refills | Status: DC

## 2021-08-08 MED ORDER — LISINOPRIL 10 MG PO TABS: 10.0000 mg | ORAL_TABLET | Freq: Every day | ORAL | 1 refills | Status: DC

## 2021-08-08 NOTE — Patient Instructions (Signed)
Health Maintenance, Female Adopting a healthy lifestyle and getting preventive care are important in promoting health and wellness. Ask your health care provider about: The right schedule for you to have regular tests and exams. Things you can do on your own to prevent diseases and keep yourself healthy. What should I know about diet, weight, and exercise? Eat a healthy diet  Eat a diet that includes plenty of vegetables, fruits, low-fat dairy products, and lean protein. Do not eat a lot of foods that are high in solid fats, added sugars, or sodium. Maintain a healthy weight Body mass index (BMI) is used to identify weight problems. It estimates body fat based on height and weight. Your health care provider can help determine your BMI and help you achieve or maintain a healthy weight. Get regular exercise Get regular exercise. This is one of the most important things you can do for your health. Most adults should: Exercise for at least 150 minutes each week. The exercise should increase your heart rate and make you sweat (moderate-intensity exercise). Do strengthening exercises at least twice a week. This is in addition to the moderate-intensity exercise. Spend less time sitting. Even light physical activity can be beneficial. Watch cholesterol and blood lipids Have your blood tested for lipids and cholesterol at 74 years of age, then have this test every 5 years. Have your cholesterol levels checked more often if: Your lipid or cholesterol levels are high. You are older than 74 years of age. You are at high risk for heart disease. What should I know about cancer screening? Depending on your health history and family history, you may need to have cancer screening at various ages. This may include screening for: Breast cancer. Cervical cancer. Colorectal cancer. Skin cancer. Lung cancer. What should I know about heart disease, diabetes, and high blood pressure? Blood pressure and heart  disease High blood pressure causes heart disease and increases the risk of stroke. This is more likely to develop in people who have high blood pressure readings, are of African descent, or are overweight. Have your blood pressure checked: Every 3-5 years if you are 18-39 years of age. Every year if you are 40 years old or older. Diabetes Have regular diabetes screenings. This checks your fasting blood sugar level. Have the screening done: Once every three years after age 40 if you are at a normal weight and have a low risk for diabetes. More often and at a younger age if you are overweight or have a high risk for diabetes. What should I know about preventing infection? Hepatitis B If you have a higher risk for hepatitis B, you should be screened for this virus. Talk with your health care provider to find out if you are at risk for hepatitis B infection. Hepatitis C Testing is recommended for: Everyone born from 1945 through 1965. Anyone with known risk factors for hepatitis C. Sexually transmitted infections (STIs) Get screened for STIs, including gonorrhea and chlamydia, if: You are sexually active and are younger than 74 years of age. You are older than 74 years of age and your health care provider tells you that you are at risk for this type of infection. Your sexual activity has changed since you were last screened, and you are at increased risk for chlamydia or gonorrhea. Ask your health care provider if you are at risk. Ask your health care provider about whether you are at high risk for HIV. Your health care provider may recommend a prescription medicine   to help prevent HIV infection. If you choose to take medicine to prevent HIV, you should first get tested for HIV. You should then be tested every 3 months for as long as you are taking the medicine. Pregnancy If you are about to stop having your period (premenopausal) and you may become pregnant, seek counseling before you get  pregnant. Take 400 to 800 micrograms (mcg) of folic acid every day if you become pregnant. Ask for birth control (contraception) if you want to prevent pregnancy. Osteoporosis and menopause Osteoporosis is a disease in which the bones lose minerals and strength with aging. This can result in bone fractures. If you are 65 years old or older, or if you are at risk for osteoporosis and fractures, ask your health care provider if you should: Be screened for bone loss. Take a calcium or vitamin D supplement to lower your risk of fractures. Be given hormone replacement therapy (HRT) to treat symptoms of menopause. Follow these instructions at home: Lifestyle Do not use any products that contain nicotine or tobacco, such as cigarettes, e-cigarettes, and chewing tobacco. If you need help quitting, ask your health care provider. Do not use street drugs. Do not share needles. Ask your health care provider for help if you need support or information about quitting drugs. Alcohol use Do not drink alcohol if: Your health care provider tells you not to drink. You are pregnant, may be pregnant, or are planning to become pregnant. If you drink alcohol: Limit how much you use to 0-1 drink a day. Limit intake if you are breastfeeding. Be aware of how much alcohol is in your drink. In the [place] one drink equals one 12 oz bottle of beer (355 mL), one 5 oz glass of wine (148 mL), or one 1 oz glass of hard liquor (44 mL). General instructions Schedule regular health, dental, and eye exams. Stay current with your vaccines. Tell your health care provider if: You often feel depressed. You have ever been abused or do not feel safe at home. Summary Adopting a healthy lifestyle and getting preventive care are important in promoting health and wellness. Follow your health care provider's instructions about healthy diet, exercising, and getting tested or screened for diseases. Follow your health care provider's  instructions on monitoring your cholesterol and blood pressure. This information is not intended to replace advice given to you by your health care provider. Make sure you discuss any questions you have with your health care provider. Document Revised: 01/12/2021 Document Reviewed: 10/28/2018 Elsevier Patient Education  2022 [employer] Inc.  

## 2021-08-08 NOTE — Progress Notes (Signed)
I,YAMILKA J Llittleton,acting as a Education administrator for Maximino Greenland, MD.,have documented all relevant documentation on the behalf of Maximino Greenland, MD,as directed by  Maximino Greenland, MD while in the presence of Maximino Greenland, MD.  This visit occurred during the SARS-CoV-2 public health emergency.  Safety protocols were in place, including screening questions prior to the visit, additional usage of staff PPE, and extensive cleaning of exam room while observing appropriate contact time as indicated for disinfecting solutions.  Subjective:     Patient ID: [name] , female    DOB: 07-07-1946 , 75 y.o.   MRN: [id number]   Chief Complaint  Patient presents with   Annual Exam   Hypertension    HPI  Patient here for hm. She is no longer followed by a GYN. She is okay with getting a Pap smear today. She reports compliance with meds. Denies headaches, chest pain and shortness of breath.   Hypertension This is a chronic problem. The current episode started more than 1 year ago. The problem has been gradually improving since onset. The problem is uncontrolled. Pertinent negatives include no blurred vision, chest pain, palpitations or shortness of breath. The current treatment provides moderate improvement.    Past Medical History:  Diagnosis Date   Back pain    Falls    Fibromyalgia    Hypertension    Joint pain    Joint stiffness    Joint swelling    Pure hypercholesterolemia 01/11/2020   Raynaud's disease    age 77's   Sleep apnea    Varicose veins of both lower extremities    Vitamin B12 deficiency    2020   Vitamin D deficiency      Family History  Problem Relation Age of Onset   Hypertension Mother    Hyperthyroidism Mother    Hypertension Father    Prostate cancer Father    Diabetes Brother    Breast cancer Paternal Aunt    Breast cancer Paternal Grandmother    Peripheral vascular disease Other      Current Outpatient Medications:    acetaminophen (TYLENOL) 500  MG tablet, Take 1,000 mg by mouth daily as needed for moderate pain., Disp: , Rfl:    Ascorbic Acid (VITAMIN C) 1000 MG tablet, Take 1,000 mg by mouth daily., Disp: , Rfl:    atorvastatin (LIPITOR) 10 MG tablet, Take 1 tablet (10 mg total) by mouth daily., Disp: 90 tablet, Rfl: 1   Cyanocobalamin (VITAMIN B 12 PO), Take by mouth., Disp: , Rfl:    ECHINACEA EXTRACT PO, Take by mouth., Disp: , Rfl:    Multiple Vitamin (MULTIVITAMIN) capsule, Take 1 capsule by mouth daily., Disp: , Rfl:    topiramate (TOPAMAX) 25 MG tablet, Take 1 tablet (25 mg total) by mouth daily with supper., Disp: 30 tablet, Rfl: 0   Vitamin D, Ergocalciferol, (DRISDOL) 1.25 MG (50000 UNIT) CAPS capsule, Take 1 capsule (50,000 Units total) by mouth every 7 (seven) days., Disp: 12 capsule, Rfl: 1   Zinc 30 MG CAPS, Take by mouth., Disp: , Rfl:    hydrochlorothiazide (HYDRODIURIL) 25 MG tablet, Take 1 tablet (25 mg total) by mouth daily., Disp: 90 tablet, Rfl: 1   lisinopril (ZESTRIL) 10 MG tablet, Take 1 tablet (10 mg total) by mouth daily., Disp: 90 tablet, Rfl: 1   Allergies  Allergen Reactions   Penicillins     Does not remember      The patient states she uses post menopausal  status for birth control. Last LMP was No LMP recorded. Patient is postmenopausal.. Negative for Dysmenorrhea. Negative for: breast discharge, breast lump(s), breast pain and breast self exam. Associated symptoms include abnormal vaginal bleeding. Pertinent negatives include abnormal bleeding (hematology), anxiety, decreased libido, depression, difficulty falling sleep, dyspareunia, history of infertility, nocturia, sexual dysfunction, sleep disturbances, urinary incontinence, urinary urgency, vaginal discharge and vaginal itching. Diet regular. Her tobacco use is:  Social History   Tobacco Use  Smoking Status Former  Smokeless Tobacco Never  Tobacco Comments   quit at age 64,only smoked for 1-2 years  . She has been exposed to passive smoke.  The patient's alcohol use is:  Social History   Substance and Sexual Activity  Alcohol Use Yes   Comment: 2 drinks weekly   Review of Systems  Constitutional: Negative.   HENT: Negative.    Eyes: Negative.  Negative for blurred vision.  Respiratory: Negative.  Negative for shortness of breath.   Cardiovascular: Negative.  Negative for chest pain and palpitations.  Gastrointestinal: Negative.   Endocrine: Negative.   Genitourinary: Negative.   Musculoskeletal: Negative.   Skin: Negative.   Allergic/Immunologic: Negative.   Neurological: Negative.   Hematological: Negative.   Psychiatric/Behavioral: Negative.      Today's Vitals   08/08/21 1104  BP: 122/80  Pulse: 68  Temp: 98.6 F (37 C)   There is no height or weight on file to calculate BMI. Pt declined to get her weight today.  Objective:  Physical Exam Vitals and nursing note reviewed. Exam conducted with a chaperone present.  Constitutional:      Appearance: Normal appearance. She is obese. She is not ill-appearing.  HENT:     Head: Normocephalic and atraumatic.     Right Ear: Tympanic membrane, ear canal and external ear normal.     Left Ear: Tympanic membrane, ear canal and external ear normal.     Nose:     Comments: Masked     Mouth/Throat:     Comments: Masked  Eyes:     Extraocular Movements: Extraocular movements intact.     Conjunctiva/sclera: Conjunctivae normal.     Pupils: Pupils are equal, round, and reactive to light.  Cardiovascular:     Rate and Rhythm: Normal rate and regular rhythm.     Pulses: Normal pulses.     Heart sounds: Normal heart sounds.  Pulmonary:     Effort: Pulmonary effort is normal.     Breath sounds: Normal breath sounds.  Chest:  Breasts:    Tanner Score is 5.     Right: Normal.     Left: Normal.  Abdominal:     General: Abdomen is flat. Bowel sounds are normal.     Palpations: Abdomen is soft.     Hernia: There is no hernia in the left inguinal area or right  inguinal area.  Genitourinary:    General: Normal vulva.     Exam position: Lithotomy position.     Pubic Area: No rash or pubic lice.      Tanner stage (genital): 5.     Labia:        Right: No rash.        Left: No rash.      Vagina: Normal.     Cervix: Normal.     Uterus: Normal.      Rectum: Normal. Guaiac result negative. No tenderness.  Musculoskeletal:        General: Normal range of motion.  Cervical back: Normal range of motion and neck supple.  Lymphadenopathy:     Lower Body: No right inguinal adenopathy. No left inguinal adenopathy.  Skin:    General: Skin is warm and dry.  Neurological:     General: No focal deficit present.     Mental Status: She is alert and oriented to person, place, and time.  Psychiatric:        Mood and Affect: Mood normal.        Behavior: Behavior normal.        Assessment And Plan:     1. Encounter for general adult medical examination w/o abnormal findings Comments: A full exam was performed.  Importance of monthly self breast exams was discussed to the patient. PATIENT IS ADVISED TO GET 30-45 MINUTES REGULAR EXERCISE NO LESS THAN FOUR TO FIVE DAYS PER WEEK - BOTH WEIGHTBEARING EXERCISES AND AEROBIC ARE RECOMMENDED.  PATIENT IS ADVISED TO FOLLOW A HEALTHY DIET WITH AT LEAST SIX FRUITS/VEGGIES PER DAY, DECREASE INTAKE OF RED MEAT, AND TO INCREASE FISH INTAKE TO TWO DAYS PER WEEK.  MEATS/FISH SHOULD NOT BE FRIED, BAKED OR BROILED IS PREFERABLE.  IT IS ALSO IMPORTANT TO CUT BACK ON YOUR SUGAR INTAKE. PLEASE AVOID ANYTHING WITH ADDED SUGAR, CORN SYRUP OR OTHER SWEETENERS. IF YOU MUST USE A SWEETENER, YOU CAN TRY STEVIA. IT IS ALSO IMPORTANT TO AVOID ARTIFICIALLY SWEETENERS AND DIET BEVERAGES. LASTLY, I SUGGEST WEARING SPF 50 SUNSCREEN ON EXPOSED PARTS AND ESPECIALLY WHEN IN THE DIRECT SUNLIGHT FOR AN EXTENDED PERIOD OF TIME.  PLEASE AVOID FAST FOOD RESTAURANTS AND INCREASE YOUR WATER INTAKE.  - POC Hemoccult Bld/Stl (1-Cd Office Dx)  2.  Cervical smear, as part of routine gynecological examination Comments: Pap smear performed. Stool is heme negative.  - Cytology -Pap Smear - POC Hemoccult Bld/Stl (1-Cd Office Dx)  3. Parenchymal renal hypertension, stage 1 through stage 4 or unspecified chronic kidney disease Comments: Chronic, well controlled. EKG performed, NSR w/o acute changes. She is encouraged to follow low sodium diet. Also advised to increase daily activity. She will f/u in six months for re-evaluation.  - EKG 12-Lead - CBC - Lipid panel - lisinopril (ZESTRIL) 10 MG tablet; Take 1 tablet (10 mg total) by mouth daily.  Dispense: 90 tablet; Refill: 1 - hydrochlorothiazide (HYDRODIURIL) 25 MG tablet; Take 1 tablet (25 mg total) by mouth daily.  Dispense: 90 tablet; Refill: 1  4. Stage 3a chronic kidney disease ([place]) Comments: She had two readings with GFR 45-59 within 90 days. She has been evaluated by Renal.  Encouraged to stay well hydrated and stay hydrated.   5. Pure hypercholesterolemia Comments: Chronic, she was started on atorvastatin 10mg  at her last visit. She has taken meds without any issues. I will check lipid/ALT today. - Lipid panel - ALT  6. Other abnormal glucose Comments: Her a1c has been elevated in the past. I will recheck this today. She is encouraged to limit her intake of sweetened beverages.  - Hemoglobin A1c  7. Immunization due Comments: She was given high dose flu vaccine.  - Flu Vaccine QUAD High Dose(Fluad)  Patient was given opportunity to ask questions. Patient verbalized understanding of the plan and was able to repeat key elements of the plan. All questions were answered to their satisfaction.   I, Maximino Greenland, MD, have reviewed all documentation for this visit. The documentation on 08/10/21 for the exam, diagnosis, procedures, and orders are all accurate and complete.  THE PATIENT IS ENCOURAGED TO PRACTICE  SOCIAL DISTANCING DUE TO THE COVID-19 PANDEMIC.

## 2021-08-09 LAB — CBC
Hematocrit: 38.2 % (ref 34.0–46.6)
Hemoglobin: 12.5 g/dL (ref 11.1–15.9)
MCH: 30.8 pg (ref 26.6–33.0)
MCHC: 32.7 g/dL (ref 31.5–35.7)
MCV: 94 fL (ref 79–97)
Platelets: 307 10*3/uL (ref 150–450)
RBC: 4.06 x10E6/uL (ref 3.77–5.28)
RDW: 12.7 % (ref 11.7–15.4)
WBC: 5.5 10*3/uL (ref 3.4–10.8)

## 2021-08-09 LAB — LIPID PANEL
Chol/HDL Ratio: 2.9 ratio (ref 0.0–4.4)
Cholesterol, Total: 163 mg/dL (ref 100–199)
HDL: 57 mg/dL (ref >39)
LDL Chol Calc (NIH): 90 mg/dL (ref 0–99)
Triglycerides: 88 mg/dL (ref 0–149)
VLDL Cholesterol Cal: 16 mg/dL (ref 5–40)

## 2021-08-09 LAB — HEMOGLOBIN A1C
Est. average glucose Bld gHb Est-mCnc: 108 mg/dL
Hgb A1c MFr Bld: 5.4 % (ref 4.8–5.6)

## 2021-08-09 LAB — ALT: ALT: 16 IU/L (ref 0–32)

## 2021-08-13 LAB — CYTOLOGY - PAP
Comment: NEGATIVE
Diagnosis: NEGATIVE
High risk HPV: NEGATIVE

## 2021-08-20 ENCOUNTER — Ambulatory Visit: Payer: Medicare HMO

## 2021-09-26 ENCOUNTER — Other Ambulatory Visit: Payer: Self-pay

## 2021-09-26 ENCOUNTER — Other Ambulatory Visit: Payer: Self-pay | Admitting: Nurse Practitioner

## 2021-09-26 ENCOUNTER — Ambulatory Visit
Admission: RE | Admit: 2021-09-26 | Discharge: 2021-09-26 | Disposition: A | Discharge status: Home / Self Care | Payer: Medicare HMO | Source: Ambulatory Visit | Attending: Internal Medicine | Admitting: Internal Medicine

## 2021-09-26 DIAGNOSIS — Z1231 Encounter for screening mammogram for malignant neoplasm of breast: Secondary | ICD-10-CM

## 2021-09-26 DIAGNOSIS — I129 Hypertensive chronic kidney disease with stage 1 through stage 4 chronic kidney disease, or unspecified chronic kidney disease: Secondary | ICD-10-CM

## 2021-09-26 MED ORDER — HYDROCHLOROTHIAZIDE 25 MG PO TABS
25.0000 mg | ORAL_TABLET | Freq: Every day | ORAL | 0 refills | Status: DC
Start: 2021-09-26 — End: 2021-11-28

## 2021-10-08 DIAGNOSIS — H35411 Lattice degeneration of retina, right eye: Secondary | ICD-10-CM | POA: Diagnosis not present

## 2021-10-08 DIAGNOSIS — H353131 Nonexudative age-related macular degeneration, bilateral, early dry stage: Secondary | ICD-10-CM | POA: Diagnosis not present

## 2021-10-08 DIAGNOSIS — Z961 Presence of intraocular lens: Secondary | ICD-10-CM | POA: Diagnosis not present

## 2021-10-08 DIAGNOSIS — H0015 Chalazion left lower eyelid: Secondary | ICD-10-CM | POA: Diagnosis not present

## 2021-10-08 DIAGNOSIS — H35372 Puckering of macula, left eye: Secondary | ICD-10-CM | POA: Diagnosis not present

## 2021-10-08 DIAGNOSIS — H35363 Drusen (degenerative) of macula, bilateral: Secondary | ICD-10-CM | POA: Diagnosis not present

## 2021-10-08 DIAGNOSIS — H35342 Macular cyst, hole, or pseudohole, left eye: Secondary | ICD-10-CM | POA: Diagnosis not present

## 2021-10-09 ENCOUNTER — Other Ambulatory Visit: Payer: Self-pay | Admitting: Nurse Practitioner

## 2021-10-09 DIAGNOSIS — E7800 Pure hypercholesterolemia, unspecified: Secondary | ICD-10-CM

## 2021-11-28 ENCOUNTER — Encounter: Payer: Self-pay | Admitting: Internal Medicine

## 2021-11-28 ENCOUNTER — Other Ambulatory Visit: Payer: Self-pay

## 2021-11-28 ENCOUNTER — Emergency Department (HOSPITAL_COMMUNITY)
Admission: EM | Admit: 2021-11-28 | Discharge: 2021-11-29 | Disposition: A | Discharge status: Home / Self Care | Payer: Medicare HMO | Attending: Emergency Medicine | Admitting: Emergency Medicine

## 2021-11-28 ENCOUNTER — Ambulatory Visit (INDEPENDENT_AMBULATORY_CARE_PROVIDER_SITE_OTHER): Payer: Medicare HMO | Admitting: Internal Medicine

## 2021-11-28 VITALS — BP 124/70 | HR 80 | Temp 98.1°F | Ht 67.0 in

## 2021-11-28 DIAGNOSIS — E7800 Pure hypercholesterolemia, unspecified: Secondary | ICD-10-CM

## 2021-11-28 DIAGNOSIS — R55 Syncope and collapse: Secondary | ICD-10-CM | POA: Insufficient documentation

## 2021-11-28 DIAGNOSIS — Z6837 Body mass index (BMI) 37.0-37.9, adult: Secondary | ICD-10-CM

## 2021-11-28 DIAGNOSIS — N189 Chronic kidney disease, unspecified: Secondary | ICD-10-CM | POA: Insufficient documentation

## 2021-11-28 DIAGNOSIS — Z79899 Other long term (current) drug therapy: Secondary | ICD-10-CM | POA: Diagnosis not present

## 2021-11-28 DIAGNOSIS — Z23 Encounter for immunization: Secondary | ICD-10-CM | POA: Diagnosis not present

## 2021-11-28 DIAGNOSIS — R110 Nausea: Secondary | ICD-10-CM | POA: Insufficient documentation

## 2021-11-28 DIAGNOSIS — R202 Paresthesia of skin: Secondary | ICD-10-CM | POA: Diagnosis not present

## 2021-11-28 DIAGNOSIS — M2550 Pain in unspecified joint: Secondary | ICD-10-CM | POA: Diagnosis not present

## 2021-11-28 DIAGNOSIS — R1084 Generalized abdominal pain: Secondary | ICD-10-CM | POA: Diagnosis not present

## 2021-11-28 DIAGNOSIS — R0689 Other abnormalities of breathing: Secondary | ICD-10-CM | POA: Diagnosis not present

## 2021-11-28 DIAGNOSIS — M79641 Pain in right hand: Secondary | ICD-10-CM | POA: Diagnosis not present

## 2021-11-28 DIAGNOSIS — I129 Hypertensive chronic kidney disease with stage 1 through stage 4 chronic kidney disease, or unspecified chronic kidney disease: Secondary | ICD-10-CM

## 2021-11-28 DIAGNOSIS — R42 Dizziness and giddiness: Secondary | ICD-10-CM | POA: Diagnosis not present

## 2021-11-28 DIAGNOSIS — M79609 Pain in unspecified limb: Secondary | ICD-10-CM | POA: Diagnosis not present

## 2021-11-28 LAB — CBC WITH DIFFERENTIAL/PLATELET
Abs Immature Granulocytes: 0.04 10*3/uL (ref 0.00–0.07)
Basophils Absolute: 0 10*3/uL (ref 0.0–0.1)
Basophils Relative: 0 %
Eosinophils Absolute: 0.1 10*3/uL (ref 0.0–0.5)
Eosinophils Relative: 1 %
HCT: 44.2 % (ref 36.0–46.0)
Hemoglobin: 14.1 g/dL (ref 12.0–15.0)
Immature Granulocytes: 0 %
Lymphocytes Relative: 18 %
Lymphs Abs: 1.9 10*3/uL (ref 0.7–4.0)
MCH: 30.6 pg (ref 26.0–34.0)
MCHC: 31.9 g/dL (ref 30.0–36.0)
MCV: 95.9 fL (ref 80.0–100.0)
Monocytes Absolute: 0.5 10*3/uL (ref 0.1–1.0)
Monocytes Relative: 5 %
Neutro Abs: 8 10*3/uL — ABNORMAL HIGH (ref 1.7–7.7)
Neutrophils Relative %: 76 %
Platelets: 314 10*3/uL (ref 150–400)
RBC: 4.61 MIL/uL (ref 3.87–5.11)
RDW: 13.4 % (ref 11.5–15.5)
WBC: 10.5 10*3/uL (ref 4.0–10.5)
nRBC: 0 % (ref 0.0–0.2)

## 2021-11-28 MED ORDER — ZOSTER VAC RECOMB ADJUVANTED 50 MCG/0.5ML IM SUSR: 0.5000 mL | Freq: Once | INTRAMUSCULAR | 1 refills | Status: DC

## 2021-11-28 MED ORDER — ALUM & MAG HYDROXIDE-SIMETH 200-200-20 MG/5ML PO SUSP
30.0000 mL | Freq: Once | ORAL | Status: AC
  Administered 2021-11-28: 30 mL via ORAL
  Filled 2021-11-28: qty 30

## 2021-11-28 MED ORDER — ASPIRIN 81 MG PO CHEW
324.0000 mg | CHEWABLE_TABLET | Freq: Once | ORAL | Status: AC
  Administered 2021-11-28: 324 mg via ORAL
  Filled 2021-11-28: qty 4

## 2021-11-28 MED ORDER — ATORVASTATIN CALCIUM 10 MG PO TABS: ORAL_TABLET | ORAL | 1 refills | Status: DC

## 2021-11-28 MED ORDER — LISINOPRIL 10 MG PO TABS: 10.0000 mg | ORAL_TABLET | Freq: Every day | ORAL | 1 refills | Status: DC

## 2021-11-28 MED ORDER — HYDROCHLOROTHIAZIDE 25 MG PO TABS: 25.0000 mg | ORAL_TABLET | Freq: Every day | ORAL | 0 refills | Status: DC

## 2021-11-28 MED ORDER — LACTATED RINGERS IV BOLUS
1000.0000 mL | Freq: Once | INTRAVENOUS | Status: AC
  Administered 2021-11-29: 1000 mL via INTRAVENOUS

## 2021-11-28 MED ORDER — ONDANSETRON HCL 4 MG/2ML IJ SOLN
4.0000 mg | Freq: Once | INTRAMUSCULAR | Status: AC
  Administered 2021-11-28: 4 mg via INTRAVENOUS
  Filled 2021-11-28: qty 2

## 2021-11-28 MED ORDER — ZOSTER VAC RECOMB ADJUVANTED 50 MCG/0.5ML IM SUSR: 0.5000 mL | Freq: Once | INTRAMUSCULAR | 1 refills | Status: AC

## 2021-11-28 NOTE — Progress Notes (Signed)
I,[name],acting as a Education administrator for Maximino Greenland, MD.,have documented all relevant documentation on the behalf of Maximino Greenland, MD,as directed by  Maximino Greenland, MD while in the presence of Maximino Greenland, MD.  This visit occurred during the SARS-CoV-2 public health emergency.  Safety protocols were in place, including screening questions prior to the visit, additional usage of staff PPE, and extensive cleaning of exam room while observing appropriate contact time as indicated for disinfecting solutions.  Subjective:     Patient ID: [name] , female    DOB: Sep 02, 1946 , 76 y.o.   MRN: [id number]   Chief Complaint  Patient presents with   Generalized Body Aches    HPI  The patient is here today for body pain.  She c/o generalized joint pains and stiffness.  She states her sx have worsened over the past several weeks. Also with R hand pain. She is unable to make a fist due to pain/stiffness. She has also noticed any RUE weakness. Not sure what triggers her sx. She awakens with pain/stiffness, and states that her right hip persists throughout the day. She does admit that activity helps to decrease her sx.     Past Medical History:  Diagnosis Date   Back pain    Falls    Fibromyalgia    Hypertension    Joint pain    Joint stiffness    Joint swelling    Pure hypercholesterolemia 01/11/2020   Raynaud's disease    age 64's   Sleep apnea    Varicose veins of both lower extremities    Vitamin B12 deficiency    2020   Vitamin D deficiency      Family History  Problem Relation Age of Onset   Hypertension Mother    Hyperthyroidism Mother    Hypertension Father    Prostate cancer Father    Diabetes Brother    Breast cancer Paternal Aunt    Breast cancer Paternal Grandmother    Peripheral vascular disease Other      Current Outpatient Medications:    acetaminophen (TYLENOL) 500 MG tablet, Take 1,000 mg by mouth daily as needed for moderate pain., Disp: ,  Rfl:    Ascorbic Acid (VITAMIN C) 1000 MG tablet, Take 1,000 mg by mouth daily., Disp: , Rfl:    atorvastatin (LIPITOR) 10 MG tablet, TAKE 1 TABLET(10 MG) BY MOUTH DAILY, Disp: 90 tablet, Rfl: 1   Cyanocobalamin (VITAMIN B 12 PO), Take by mouth., Disp: , Rfl:    ECHINACEA EXTRACT PO, Take by mouth., Disp: , Rfl:    hydrochlorothiazide (HYDRODIURIL) 25 MG tablet, Take 1 tablet (25 mg total) by mouth daily., Disp: 30 tablet, Rfl: 0   lisinopril (ZESTRIL) 10 MG tablet, Take 1 tablet (10 mg total) by mouth daily., Disp: 90 tablet, Rfl: 1   Multiple Vitamin (MULTIVITAMIN) capsule, Take 1 capsule by mouth daily., Disp: , Rfl:    topiramate (TOPAMAX) 25 MG tablet, Take 1 tablet (25 mg total) by mouth daily with supper., Disp: 30 tablet, Rfl: 0   Vitamin D, Ergocalciferol, (DRISDOL) 1.25 MG (50000 UNIT) CAPS capsule, Take 1 capsule (50,000 Units total) by mouth every 7 (seven) days., Disp: 12 capsule, Rfl: 1   Zinc 30 MG CAPS, Take by mouth., Disp: , Rfl:    Zoster Vaccine Adjuvanted (SHINGRIX) injection, Inject 0.5 mLs into the muscle once for 1 dose. Please fax when each dose administered [phone], Disp: 0.5 mL, Rfl: 1   Allergies  Allergen  Reactions   Penicillins     Does not remember     Review of Systems  Constitutional: Negative.   Respiratory: Negative.    Cardiovascular: Negative.   Gastrointestinal: Negative.   Musculoskeletal:  Positive for arthralgias.  Neurological:  Positive for numbness.  Psychiatric/Behavioral: Negative.      Today's Vitals   11/28/21 0932  BP: 124/70  Pulse: 80  Temp: 98.1 F (36.7 C)  Height: 5\' 7"  (1.702 m)  PainSc: 3   PainLoc: Generalized   Body mass index is 37.59 kg/m.  Wt Readings from Last 3 Encounters:  05/29/20 240 lb (108.9 kg)  05/02/20 243 lb (110.2 kg)  04/18/20 255 lb (115.7 kg)    BP Readings from Last 3 Encounters:  11/28/21 124/70  08/08/21 122/80  05/16/21 118/66    Objective:  Physical Exam Vitals and nursing note  reviewed.  Constitutional:      Appearance: Normal appearance.  HENT:     Head: Normocephalic and atraumatic.     Nose:     Comments: Masked     Mouth/Throat:     Comments: Masked  Eyes:     Extraocular Movements: Extraocular movements intact.  Cardiovascular:     Rate and Rhythm: Normal rate and regular rhythm.     Heart sounds: Normal heart sounds.  Pulmonary:     Effort: Pulmonary effort is normal.     Breath sounds: Normal breath sounds.  Musculoskeletal:     Cervical back: Normal range of motion.  Skin:    General: Skin is warm.  Neurological:     General: No focal deficit present.     Mental Status: She is alert.  Psychiatric:        Mood and Affect: Mood normal.        Behavior: Behavior normal.        Assessment And Plan:     1. Arthralgia, unspecified joint Comments: She is encouraged to follow anti-inflammatory diet, free of processed foods, sugars and artificial sweeteners. I will check arthritis panel today.  - ANA, IFA (with reflex) - CYCLIC CITRUL PEPTIDE ANTIBODY, IGG/IGA - Rheumatoid factor - Sedimentation rate - Uric acid  2. Right hand pain Comments: Please see above.  - ANA, IFA (with reflex) - CYCLIC CITRUL PEPTIDE ANTIBODY, IGG/IGA - Rheumatoid factor - Sedimentation rate - Uric acid  3. Paresthesia and pain of left extremity Comments: I will check TSH and vitamin B12 levels.  - TSH  4. Class 2 severe obesity with serious comorbidity and body mass index (BMI) of 37.0 to 37.9 in adult, unspecified obesity type ([place]) Comments: She is encouraged to aim for at least 150 minutes of exercise per week. Encouraged to continue with her water aerobics class which should help w/ joint pain.   5. Need for vaccination Comments: I will send rx Shingrix to her local pharmacy. She was also given Pneumovax-23 x 1.   6. Drug therapy - Vitamin B12  Patient was given opportunity to ask questions. Patient verbalized understanding of the plan and was able  to repeat key elements of the plan. All questions were answered to their satisfaction.   I, Maximino Greenland, MD, have reviewed all documentation for this visit. The documentation on 11/28/21 for the exam, diagnosis, procedures, and orders are all accurate and complete.   IF YOU HAVE BEEN REFERRED TO A SPECIALIST, IT MAY TAKE 1-2 WEEKS TO SCHEDULE/PROCESS THE REFERRAL. IF YOU HAVE NOT HEARD FROM US/SPECIALIST IN TWO WEEKS, PLEASE GIVE [place]  A CALL AT [phone].   THE PATIENT IS ENCOURAGED TO PRACTICE SOCIAL DISTANCING DUE TO THE COVID-19 PANDEMIC.

## 2021-11-28 NOTE — ED Triage Notes (Signed)
Pt BIB EMS for a near syncapal episode.Pt was using the bathroom and felt hot and weak with a headache. On EMS arrival pt was back to baseline and had another episode while sitting on the couch. Pt was diaphoretic on scene, denies chest pain. Pt complains of acid reflux and has increasing burping before her "episodes"  Pt reports having her shingles and flu shot at 1200 today.   Intial BP 160/   90/40, 142/100  95% RA  124 CBG  66 HR

## 2021-11-28 NOTE — ED Provider Notes (Signed)
[facility] EMERGENCY DEPARTMENT Provider Note   CSN: [id number] Arrival date & time: 11/28/21  2238     History  Chief Complaint  Patient presents with   Near Syncope    [name] is a 76 y.o. female.  The history is provided by the patient.  Near Syncope She has history of hypertension, hyperlipidemia, chronic kidney disease and comes in after having had 2 episodes of near syncope.  She felt like her stomach was bloated, followed by sense of feeling hot and breaking out in a sweat.  She got lightheaded and noted some tight feeling in her chest and some difficulty breathing.  There was mild nausea.  She thought she might have to have a bowel movement and went to the bathroom, but actually got worse.  Symptoms resolved after few minutes, but she then had a second episode about 15 minutes later.  She came in by ambulance.  She had a similar episode about 2 weeks ago for which she did not seek medical attention.  She denies chest pain per se.  There has been no palpitations.  She is a non-smoker and denies history of diabetes and denies family history of premature coronary atherosclerosis.   Home Medications Prior to Admission medications   Medication Sig Start Date End Date Taking? Authorizing Provider  acetaminophen (TYLENOL) 500 MG tablet Take 1,000 mg by mouth daily as needed for moderate pain.    [provider]  Ascorbic Acid (VITAMIN C) 1000 MG tablet Take 1,000 mg by mouth daily.    [provider]  atorvastatin (LIPITOR) 10 MG tablet TAKE 1 TABLET(10 MG) BY MOUTH DAILY 11/28/21   Glendale Chard, MD  Cyanocobalamin (VITAMIN B 12 PO) Take by mouth.    [provider]  ECHINACEA EXTRACT PO Take by mouth.    [provider]  hydrochlorothiazide (HYDRODIURIL) 25 MG tablet Take 1 tablet (25 mg total) by mouth daily. 11/28/21 05/27/22  Glendale Chard, MD  lisinopril (ZESTRIL) 10 MG tablet Take 1 tablet (10 mg total) by mouth  daily. 11/28/21 11/28/22  Glendale Chard, MD  Multiple Vitamin (MULTIVITAMIN) capsule Take 1 capsule by mouth daily.    [provider]  topiramate (TOPAMAX) 25 MG tablet Take 1 tablet (25 mg total) by mouth daily with supper. 05/29/20   Danford, Valetta Fuller D, NP  Vitamin D, Ergocalciferol, (DRISDOL) 1.25 MG (50000 UNIT) CAPS capsule Take 1 capsule (50,000 Units total) by mouth every 7 (seven) days. 06/08/21   Glendale Chard, MD  Zinc 30 MG CAPS Take by mouth.    [provider]  Zoster Vaccine Adjuvanted [facility]) injection Inject 0.5 mLs into the muscle once for 1 dose. Please fax when each dose administered [phone] 11/28/21 11/28/21  Glendale Chard, MD      Allergies    Penicillins    Review of Systems   Review of Systems  Cardiovascular:  Positive for near-syncope.  All other systems reviewed and are negative.  Physical Exam Updated Vital Signs BP 105/63    Pulse 69    Temp 97.6 F (36.4 C) (Oral)    Resp 16    Ht 5\' 7"  (1.702 m)    Wt 97.5 kg    SpO2 100%    BMI 33.67 kg/m  Physical Exam Vitals and nursing note reviewed.  76 year old female, resting comfortably and in no acute distress. Vital signs are normal. Oxygen saturation is 100%, which is normal. Head is normocephalic and atraumatic. PERRLA, EOMI. Oropharynx  is clear. Neck is nontender and supple without adenopathy or JVD. Back is nontender and there is no CVA tenderness. Lungs are clear without rales, wheezes, or rhonchi. Chest is nontender. Heart has regular rate and rhythm without murmur. Abdomen is soft, flat, nontender without masses or hepatosplenomegaly and peristalsis is normoactive. Extremities have trace edema, full range of motion is present. Skin is warm and dry without rash. Neurologic: Mental status is normal, cranial nerves are intact, there are no motor or sensory deficits.  ED Results / Procedures / Treatments   Labs (all labs ordered are listed, but only abnormal results are  displayed) Labs Reviewed  BASIC METABOLIC PANEL - Abnormal; Notable for the following components:      Result Value   Glucose, Bld 129 (*)    All other components within normal limits  CBC WITH DIFFERENTIAL/PLATELET - Abnormal; Notable for the following components:   Neutro Abs 8.0 (*)    All other components within normal limits  TROPONIN I (HIGH SENSITIVITY)  TROPONIN I (HIGH SENSITIVITY)    EKG EKG Interpretation  Date/Time:  Wednesday November 28 2021 22:48:54 EST Ventricular Rate:  68 PR Interval:  143 QRS Duration: 103 QT Interval:  401 QTC Calculation: 427 R Axis:   -25 Text Interpretation: Sinus rhythm Borderline left axis deviation Low voltage, precordial leads Abnormal R-wave progression, early transition Consider anterior infarct When compared with ECG of 03/30/2018, No significant change was found Confirmed by Delora Fuel ([id number]) on 11/28/2021 11:02:15 PM  Radiology No results found.  Procedures Procedures  Cardiac monitor shows sinus rhythm per my interpretation.  Medications Ordered in ED Medications  pantoprazole (PROTONIX) EC tablet 40 mg (has no administration in time range)  alum & mag hydroxide-simeth (MAALOX/MYLANTA) 200-200-20 MG/5ML suspension 30 mL (30 mLs Oral Given 11/28/21 2354)  aspirin chewable tablet 324 mg (324 mg Oral Given 11/28/21 2352)  ondansetron (ZOFRAN) injection 4 mg (4 mg Intravenous Given 11/28/21 2357)  lactated ringers bolus 1,000 mL (0 mLs Intravenous Stopped 11/29/21 0136)    ED Course/ Medical Decision Making/ A&P                           Medical Decision Making  Near syncope.  Episode sounds like a vasovagal episode, but need to rule out ACS.  While in the ED, patient had recurrence of episode.  Blood pressure dropped down to 94 systolic.  She will be given IV fluids, ondansetron.  We will also give aspirin because of concern of possible ACS.  Old records are reviewed, and she has no relevant past visits.  Labs are unremarkable  including troponin x2.  She had initial improvement following a dose of an antiacid, but belching started to recur and she is given a dose of pantoprazole.  She was ambulated in the emergency department without difficulty.  She is discharged with recommendation to follow-up with her primary care provider and her cardiologist, consider GI consultation as well.  She is given a prescription for pantoprazole.        Final Clinical Impression(s) / ED Diagnoses Final diagnoses:  Near syncope    Rx / DC Orders ED Discharge Orders          Ordered    pantoprazole (PROTONIX) 40 MG tablet  Daily        11/29/21 7001              Delora Fuel, MD 74[birth year]49 [phone]

## 2021-11-29 LAB — BASIC METABOLIC PANEL
Anion gap: 11 (ref 5–15)
BUN: 21 mg/dL (ref 8–23)
CO2: 24 mmol/L (ref 22–32)
Calcium: 9 mg/dL (ref 8.9–10.3)
Chloride: 104 mmol/L (ref 98–111)
Creatinine, Ser: 0.92 mg/dL (ref 0.44–1.00)
GFR, Estimated: >60 mL/min (ref >60)
Glucose, Bld: 129 mg/dL — ABNORMAL HIGH (ref 70–99)
Potassium: 3.9 mmol/L (ref 3.5–5.1)
Sodium: 139 mmol/L (ref 135–145)

## 2021-11-29 LAB — TROPONIN I (HIGH SENSITIVITY)
Troponin I (High Sensitivity): 4 ng/L (ref <18)
Troponin I (High Sensitivity): 4 ng/L (ref <18)

## 2021-11-29 MED ORDER — PANTOPRAZOLE SODIUM 40 MG PO TBEC: 40.0000 mg | DELAYED_RELEASE_TABLET | Freq: Every day | ORAL | 0 refills | Status: DC

## 2021-11-29 MED ORDER — PANTOPRAZOLE SODIUM 40 MG PO TBEC
40.0000 mg | DELAYED_RELEASE_TABLET | Freq: Once | ORAL | Status: AC
  Administered 2021-11-29: 40 mg via ORAL
  Filled 2021-11-29: qty 1

## 2021-11-29 NOTE — ED Notes (Addendum)
Pt ambulated to the restroom unassisted. Strong, steady gait noticed. Pt denies SOB, dizziness, but does endorse some stomach discomfort. Spo2 99% HR 89 while ambulating.

## 2021-11-29 NOTE — Discharge Instructions (Signed)
Please talk with your primary care provider about possible referral to a gastroenterologist.  Return to the emergency department if you are having any problems.

## 2021-11-30 LAB — TSH: TSH: 2.05 u[IU]/mL (ref 0.450–4.500)

## 2021-11-30 LAB — VITAMIN B12: Vitamin B-12: 407 pg/mL (ref 232–1245)

## 2021-11-30 LAB — CYCLIC CITRUL PEPTIDE ANTIBODY, IGG/IGA: Cyclic Citrullin Peptide Ab: 3 units (ref 0–19)

## 2021-11-30 LAB — ANTINUCLEAR ANTIBODIES, IFA: ANA Titer 1: NEGATIVE

## 2021-11-30 LAB — URIC ACID: Uric Acid: 5.6 mg/dL (ref 3.1–7.9)

## 2021-11-30 LAB — SEDIMENTATION RATE: Sed Rate: 19 mm/hr (ref 0–40)

## 2021-11-30 LAB — RHEUMATOID FACTOR: Rheumatoid fact SerPl-aCnc: <10 IU/mL (ref <14.0)

## 2021-12-07 ENCOUNTER — Telehealth: Payer: Self-pay

## 2021-12-07 NOTE — Telephone Encounter (Signed)
Called pt, trying to call twice. Call kept being interrupted with static sound on her end. Pt unable to call back being our phone lines were off. Will try pt again on Monday 1/23. She also has apt scheduled for same day.

## 2021-12-10 ENCOUNTER — Encounter: Payer: Self-pay | Admitting: Internal Medicine

## 2021-12-10 ENCOUNTER — Ambulatory Visit (INDEPENDENT_AMBULATORY_CARE_PROVIDER_SITE_OTHER): Payer: Medicare HMO | Admitting: Internal Medicine

## 2021-12-10 ENCOUNTER — Other Ambulatory Visit: Payer: Self-pay

## 2021-12-10 VITALS — BP 114/82 | HR 64 | Temp 98.2°F | Ht 67.0 in

## 2021-12-10 DIAGNOSIS — R55 Syncope and collapse: Secondary | ICD-10-CM

## 2021-12-10 DIAGNOSIS — N644 Mastodynia: Secondary | ICD-10-CM

## 2021-12-10 DIAGNOSIS — R1013 Epigastric pain: Secondary | ICD-10-CM | POA: Diagnosis not present

## 2021-12-10 NOTE — Progress Notes (Signed)
I,[name],acting as a Education administrator for Emma Greenland, MD.,have documented all relevant documentation on the behalf of Emma Greenland, MD,as directed by  Emma Greenland, MD while in the presence of Emma Greenland, MD.  This visit occurred during the SARS-CoV-2 public health emergency.  Safety protocols were in place, including screening questions prior to the visit, additional usage of staff PPE, and extensive cleaning of exam room while observing appropriate contact time as indicated for disinfecting solutions.  Subjective:     Patient ID: [name] , female    DOB: November 05, 1946 , 76 y.o.   MRN: [id number]   Chief Complaint  Patient presents with   ER follow-up    HPI  The patient is here today for a ER follow-up for syncope.  She went to ER on 11/28/21 for having had 2 episodes of near syncope.  She states she was here that morning, then went to pharmacy to get both Shingrix and flu vaccines. Later that afternoon, she ate some lunch - turnips w/ peanut sauce and carrots. Hours later, she felt like her stomach was bloated, followed by sense of feeling hot and breaking out in a sweat.  She got lightheaded and noted some tight feeling in her chest and some difficulty breathing.  There was some associated nausea.  She states her sx initially resolved, but then returned about 15 minutes later.  She went to ER by ambulance. ER workup neg for cardiac event, she was started on pantoprazole. She admits she is not taking the medication. "She has not needed it". She has not had recurrence of sx.   She adds that she had similar sx week after Christmas, she initially thought it was food poisoning.  She did not seek medical attention. Lastly, she had similar issue back in August as well. Again, she did not seek medical attention. She denies chest pain per se.  There have been no palpitations.  She is a non-smoker and denies history of diabetes and denies family history of premature coronary  atherosclerosis.      Past Medical History:  Diagnosis Date   Back pain    Falls    Fibromyalgia    Hypertension    Joint pain    Joint stiffness    Joint swelling    Pure hypercholesterolemia 01/11/2020   Raynaud's disease    age 20's   Sleep apnea    Varicose veins of both lower extremities    Vitamin B12 deficiency    2020   Vitamin D deficiency      Family History  Problem Relation Age of Onset   Hypertension Mother    Hyperthyroidism Mother    Hypertension Father    Prostate cancer Father    Diabetes Brother    Breast cancer Paternal Aunt    Breast cancer Paternal Grandmother    Peripheral vascular disease Other      Current Outpatient Medications:    acetaminophen (TYLENOL) 500 MG tablet, Take 1,000 mg by mouth daily as needed for moderate pain., Disp: , Rfl:    Ascorbic Acid (VITAMIN C) 1000 MG tablet, Take 1,000 mg by mouth daily., Disp: , Rfl:    atorvastatin (LIPITOR) 10 MG tablet, TAKE 1 TABLET(10 MG) BY MOUTH DAILY, Disp: 90 tablet, Rfl: 1   Cyanocobalamin (VITAMIN B 12 PO), Take by mouth., Disp: , Rfl:    ECHINACEA EXTRACT PO, Take by mouth., Disp: , Rfl:    hydrochlorothiazide (HYDRODIURIL) 25 MG tablet, Take 1 tablet (  25 mg total) by mouth daily., Disp: 30 tablet, Rfl: 0   lisinopril (ZESTRIL) 10 MG tablet, Take 1 tablet (10 mg total) by mouth daily., Disp: 90 tablet, Rfl: 1   Multiple Vitamin (MULTIVITAMIN) capsule, Take 1 capsule by mouth daily., Disp: , Rfl:    pantoprazole (PROTONIX) 40 MG tablet, Take 1 tablet (40 mg total) by mouth daily., Disp: 30 tablet, Rfl: 0   topiramate (TOPAMAX) 25 MG tablet, Take 1 tablet (25 mg total) by mouth daily with supper., Disp: 30 tablet, Rfl: 0   Vitamin D, Ergocalciferol, (DRISDOL) 1.25 MG (50000 UNIT) CAPS capsule, Take 1 capsule (50,000 Units total) by mouth every 7 (seven) days., Disp: 12 capsule, Rfl: 1   Zinc 30 MG CAPS, Take by mouth., Disp: , Rfl:    Allergies  Allergen Reactions   Penicillins     Does  not remember     Review of Systems  Constitutional: Negative.   Respiratory: Negative.    Cardiovascular: Negative.   Gastrointestinal: Negative.   Psychiatric/Behavioral: Negative.    All other systems reviewed and are negative.   Today's Vitals   12/10/21 0935  BP: 114/82  Pulse: 64  Temp: 98.2 F (36.8 C)  Height: 5\' 7"  (1.702 m)  PainSc: 5   PainLoc: Hip   Body mass index is 33.67 kg/m.  Wt Readings from Last 3 Encounters:  11/28/21 215 lb (97.5 kg)  05/29/20 240 lb (108.9 kg)  05/02/20 243 lb (110.2 kg)    BP Readings from Last 3 Encounters:  12/10/21 114/82  11/29/21 110/71  11/28/21 124/70    Objective:  Physical Exam Vitals and nursing note reviewed.  Constitutional:      Appearance: Normal appearance.  HENT:     Head: Normocephalic and atraumatic.     Nose:     Comments: Masked     Mouth/Throat:     Comments: Masked  Eyes:     Extraocular Movements: Extraocular movements intact.  Cardiovascular:     Rate and Rhythm: Normal rate and regular rhythm.     Heart sounds: Normal heart sounds.  Pulmonary:     Effort: Pulmonary effort is normal.     Breath sounds: Normal breath sounds.  Musculoskeletal:     Cervical back: Normal range of motion.  Skin:    General: Skin is warm.  Neurological:     General: No focal deficit present.     Mental Status: She is alert.  Psychiatric:        Mood and Affect: Mood normal.        Behavior: Behavior normal.        Assessment And Plan:     1. Vasovagal syncope Comments: ER records reviewed in full detail. If she has recurrent sx, may need to consider Cardiology evaluation for further evaluation of possible arrhythmia.   2. Dyspepsia Comments: She is encouraged to take pantoprazole daily as prescribed. If sx recur, advised to f/u with GI. Advised to consider taking Beano w/ root/cruciferous veggies as well.  3. Mastodynia of right breast Comments: She is up to date with mammogram. She is encouraged to cut  out caffeine for two weeks. Can consider qod Vitamin E supplementation if sx persist.     Patient was given opportunity to ask questions. Patient verbalized understanding of the plan and was able to repeat key elements of the plan. All questions were answered to their satisfaction.   I, Emma Greenland, MD, have reviewed all documentation for this visit.  The documentation on 12/10/21 for the exam, diagnosis, procedures, and orders are all accurate and complete.   IF YOU HAVE BEEN REFERRED TO A SPECIALIST, IT MAY TAKE 1-2 WEEKS TO SCHEDULE/PROCESS THE REFERRAL. IF YOU HAVE NOT HEARD FROM US/SPECIALIST IN TWO WEEKS, PLEASE GIVE [place] A CALL AT [phone].   THE PATIENT IS ENCOURAGED TO PRACTICE SOCIAL DISTANCING DUE TO THE COVID-19 PANDEMIC.

## 2021-12-10 NOTE — Patient Instructions (Signed)
Syncope, Adult Syncope refers to a condition in which a person temporarily loses consciousness. Syncope may also be called fainting or passing out. It is caused by a sudden decrease in blood flow to the brain. This can happen for a variety of reasons. Most causes of syncope are not dangerous. It can be triggered by things such as needle sticks, seeing blood, pain, or intense emotion. However, syncope can also be a sign of a serious medical problem, such as a heart abnormality. Other causes can include dehydration, migraines, or taking medicines that lower blood pressure. Your health care provider may do tests to find the reason why you are having syncope. If you faint, get medical help right away. Call your local emergency services (911 in the [place] Follow these instructions at home: Pay attention to any changes in your symptoms. Take these actions to stay safe and to help relieve your symptoms: Knowing when you may be about to faint Signs that you may be about to faint include: Feeling dizzy, weak, light-headed, or like the room is spinning. Feeling nauseous. Seeing spots or seeing all white or all black in your field of vision. Having cold, clammy skin or feeling warm and sweaty. Hearing ringing in the ears (tinnitus). If you start to feel like you might faint, sit or lie down right away. If sitting, put your head down between your legs. If lying down, raise (elevate) your feet above the level of your heart. Breathe deeply and steadily. Wait until all the symptoms have passed. Have someone stay with you until you feel stable. Medicines Take over-the-counter and prescription medicines only as told by your health care provider. If you are taking blood pressure or heart medicine, get up slowly and take several minutes to sit and then stand. This can reduce dizziness and decrease the risk of syncope. Lifestyle Do not drive, use machinery, or play sports until your health care provider says it is  okay. Do not drink alcohol. Do not use any products that contain nicotine or tobacco. These products include cigarettes, chewing tobacco, and vaping devices, such as e-cigarettes. If you need help quitting, ask your health care provider. Avoid hot tubs and saunas. General instructions Talk with your health care provider about your symptoms. You may need to have testing to understand the cause of your syncope. Drink enough fluid to keep your urine pale yellow. Avoid prolonged standing. If you must stand for a long time, do movements such as: Moving your legs. Crossing your legs. Flexing and stretching your leg muscles. Squatting. Keep all follow-up visits. This is important. Contact a health care provider if: You have episodes of near fainting. Get help right away if: You faint. You hit your head or are injured after fainting. You have any of these symptoms that may indicate trouble with your heart: Fast or irregular heartbeats (palpitations). Unusual pain in your chest, abdomen, or back. Shortness of breath. You have a seizure. You have a severe headache. You are confused. You have vision problems. You have severe weakness or trouble walking. You are bleeding from your mouth or rectum, or you have black or tarry stool. These symptoms may represent a serious problem that is an emergency. Do not wait to see if your symptoms will go away. Get medical help right away. Call your local emergency services (911 in the [place] Do not drive yourself to the hospital. Summary Syncope refers to a condition in which a person temporarily loses consciousness. Syncope may also be called fainting  or passing out. It is caused by a sudden decrease in blood flow to the brain. Signs that you may be about to faint include dizziness, feeling light-headed, feeling nauseous, sudden vision changes, or cold, clammy skin. Even though most causes of syncope are not dangerous, syncope can be a sign of a serious  medical problem. Get help right away if you faint. If you start to feel like you might faint, sit or lie down right away. If sitting, put your head down between your legs. If lying down, raise (elevate) your feet above the level of your heart. This information is not intended to replace advice given to you by your health care provider. Make sure you discuss any questions you have with your health care provider. Document Revised: 03/15/2021 Document Reviewed: 03/15/2021 Elsevier Patient Education  2022 [employer].

## 2021-12-25 ENCOUNTER — Other Ambulatory Visit: Payer: Self-pay | Admitting: Internal Medicine

## 2021-12-25 DIAGNOSIS — I129 Hypertensive chronic kidney disease with stage 1 through stage 4 chronic kidney disease, or unspecified chronic kidney disease: Secondary | ICD-10-CM

## 2022-01-06 ENCOUNTER — Encounter: Payer: Self-pay | Admitting: Internal Medicine

## 2022-01-07 ENCOUNTER — Encounter: Payer: Self-pay | Admitting: Gastroenterology

## 2022-01-07 ENCOUNTER — Ambulatory Visit (INDEPENDENT_AMBULATORY_CARE_PROVIDER_SITE_OTHER): Payer: Medicare HMO | Admitting: Gastroenterology

## 2022-01-07 ENCOUNTER — Other Ambulatory Visit: Payer: Self-pay

## 2022-01-07 VITALS — BP 147/82 | HR 69 | Temp 98.7°F | Ht 67.0 in | Wt 226.5 lb

## 2022-01-07 DIAGNOSIS — R1013 Epigastric pain: Secondary | ICD-10-CM | POA: Diagnosis not present

## 2022-01-07 NOTE — Progress Notes (Signed)
ne

## 2022-01-07 NOTE — Progress Notes (Signed)
Cephas Darby, MD [address]  [place]  [place], [place] [address]  Main: [phone]  Fax: [phone]    Gastroenterology Consultation  Referring Provider:     Glendale Chard, MD Primary Care Physician:  Glendale Chard, MD Primary Gastroenterologist:  Dr. Cephas Darby Reason for Consultation:     Dyspepsia        HPI:   [name] is a 76 y.o. female referred by Dr. Glendale Chard, MD  for consultation & management of dyspepsia.  Patient reports that in late September, she had a sudden episode of upper abdominal discomfort associated with abdominal bloating, nausea, vomiting and bowel urgency.  Patient denies right upper quadrant pain.  She had associated symptoms of presyncope.  Similar symptoms recurred in end of December, early January when she went to the ER.  She had similar episode on Friday past week lasted until Saturday.  Patient does not report any correlation to the food or after eating.  During the ER visit in January, she is started on Protonix 40 mg daily.  Patient has recently started plant-based diet, having plant-based protein supplements with smoothies.  She has been pescatarian, currently vegetarian.  She denies constipation, rectal bleeding.  Labs from ER visit revealed negative troponins, normal hemoglobin, normal WBC count, normal BMP, LFTs normal in the past.  Patient is planning to travel to [place] on March 11 and hoping to undergo thorough evaluation before then.  Patient does not smoke or drink alcohol  NSAIDs: None  Antiplts/Anticoagulants/Anti thrombotics: None  GI Procedures: Reports undergoing colonoscopy in [place] in 2018, polyps were removed and she was told to have a repeat colonoscopy in 2023  Colonoscopy 11/2/2004FINAL DIAGNOSIS   MICROSCOPIC EXAMINATION AND DIAGNOSIS   1. COLON, CECUM, POLYP: TUBULOVILLOUS ADENOMA. NO HIGH GRADE   DYSPLASIA OR MALIGNANCY IDENTIFIED.    2. COLON BIOPSY, DISTAL SIGMOID:  COLONIC MUCOSA WITH FOCAL MILD   ACTIVE CRYPTITIS; NO ADENOMATOUS CHANGE IDENTIFIED, SEE COMMENT.   Past Medical History:  Diagnosis Date   Back pain    Falls    Fibromyalgia    Hypertension    Joint pain    Joint stiffness    Joint swelling    Pure hypercholesterolemia 01/11/2020   Raynaud's disease    age 89's   Sleep apnea    Varicose veins of both lower extremities    Vitamin B12 deficiency    2020   Vitamin D deficiency     Past Surgical History:  Procedure Laterality Date   BREAST BIOPSY Right    BREAST EXCISIONAL BIOPSY Right    FOOT FRACTURE SURGERY Right    KNEE SURGERY Left 2007   TUBAL LIGATION      Current Outpatient Medications:    acetaminophen (TYLENOL) 500 MG tablet, Take 1,000 mg by mouth daily as needed for moderate pain., Disp: , Rfl:    atorvastatin (LIPITOR) 10 MG tablet, TAKE 1 TABLET(10 MG) BY MOUTH DAILY, Disp: 90 tablet, Rfl: 1   hydrochlorothiazide (HYDRODIURIL) 25 MG tablet, TAKE 1 TABLET(25 MG) BY MOUTH DAILY, Disp: 30 tablet, Rfl: 0   lisinopril (ZESTRIL) 10 MG tablet, Take 1 tablet (10 mg total) by mouth daily., Disp: 90 tablet, Rfl: 1   Multiple Vitamin (MULTIVITAMIN) capsule, Take 1 capsule by mouth daily., Disp: , Rfl:    pantoprazole (PROTONIX) 40 MG tablet, Take 1 tablet (40 mg total) by mouth daily., Disp: 30 tablet, Rfl: 0  Family History  Problem Relation Age of Onset  Hypertension Mother    Hyperthyroidism Mother    Hypertension Father    Prostate cancer Father    Diabetes Brother    Breast cancer Paternal Aunt    Breast cancer Paternal Grandmother    Peripheral vascular disease Other      Social History   Tobacco Use   Smoking status: Former   Smokeless tobacco: Never   Tobacco comments:    quit at age 85,only smoked for 1-2 years  Vaping Use   Vaping Use: Never used  Substance Use Topics   Alcohol use: Yes    Comment: 2 drinks weekly   Drug use: No    Allergies as of 01/07/2022 - Review Complete 01/07/2022   Allergen Reaction Noted   Penicillins  04/19/2021    Review of Systems:    All systems reviewed and negative except where noted in HPI.   Physical Exam:  BP (!) 147/82 (BP Location: Left Arm, Patient Position: Sitting, Cuff Size: Normal)    Pulse 69    Temp 98.7 F (37.1 C) (Oral)    Ht 5\' 7"  (1.702 m)    Wt 226 lb 8 oz (102.7 kg)    BMI 35.47 kg/m  No LMP recorded. Patient is postmenopausal.  General:   Alert,  Well-developed, well-nourished, pleasant and cooperative in NAD Head:  Normocephalic and atraumatic. Eyes:  Sclera clear, no icterus.   Conjunctiva pink. Ears:  Normal auditory acuity. Nose:  No deformity, discharge, or lesions. Mouth:  No deformity or lesions,oropharynx pink & moist. Neck:  Supple; no masses or thyromegaly. Lungs:  Respirations even and unlabored.  Clear throughout to auscultation.   No wheezes, crackles, or rhonchi. No acute distress. Heart:  Regular rate and rhythm; no murmurs, clicks, rubs, or gallops. Abdomen:  Normal bowel sounds. Soft, non-tender and non-distended without masses, hepatosplenomegaly or hernias noted.  No guarding or rebound tenderness.   Rectal: Not performed Msk:  Symmetrical without gross deformities. Good, equal movement & strength bilaterally. Pulses:  Normal pulses noted. Extremities:  No clubbing or edema.  No cyanosis. Neurologic:  Alert and oriented x3;  grossly normal neurologically. Skin:  Intact without significant lesions or rashes. No jaundice. Psych:  Alert and cooperative. Normal mood and affect.  Imaging Studies: No abdominal imaging  Assessment and Plan:   [name] is a 76 y.o. pleasant African-American female with history of hypertension is seen in consultation for recurrent episodes of epigastric pain associated with significant abdominal bloating, nausea, vomiting and presyncope.  Her presyncopal symptoms are likely vasovagal in setting of nausea and vomiting.  Recommend H. pylori breath test.   Patient is chewing gum during office visit today, she will come back tomorrow If H. pylori breath test is negative, will proceed with upper endoscopy for further evaluation.  If this is negative, recommend right upper quadrant ultrasound or a CT scan of the abdomen and pelvis with contrast  Personal history of colon adenomas Recommend colonoscopy in 2023   Follow up after the above work-up   Cephas Darby, MD

## 2022-01-08 DIAGNOSIS — R1013 Epigastric pain: Secondary | ICD-10-CM | POA: Diagnosis not present

## 2022-01-09 ENCOUNTER — Encounter: Payer: Self-pay | Admitting: Gastroenterology

## 2022-01-09 LAB — H. PYLORI BREATH TEST: H pylori Breath Test: NEGATIVE

## 2022-01-14 ENCOUNTER — Telehealth: Payer: Self-pay | Admitting: Gastroenterology

## 2022-01-14 ENCOUNTER — Other Ambulatory Visit: Payer: Self-pay

## 2022-01-14 DIAGNOSIS — R1013 Epigastric pain: Secondary | ICD-10-CM

## 2022-01-14 NOTE — Telephone Encounter (Signed)
Patient requesting a call back to schedule an endoscopy.

## 2022-01-14 NOTE — Telephone Encounter (Signed)
Scheduled patient for 02/25/22. Patient only wants to schedule in [place]. Patient is going to be out of the country on 03/15 and does not return till 02/13/22. Went over instructions and sent to [employer]

## 2022-01-17 ENCOUNTER — Other Ambulatory Visit: Payer: Self-pay

## 2022-01-17 ENCOUNTER — Other Ambulatory Visit: Payer: Self-pay | Admitting: Internal Medicine

## 2022-01-17 ENCOUNTER — Ambulatory Visit: Payer: Medicare HMO | Admitting: Internal Medicine

## 2022-01-17 DIAGNOSIS — I129 Hypertensive chronic kidney disease with stage 1 through stage 4 chronic kidney disease, or unspecified chronic kidney disease: Secondary | ICD-10-CM

## 2022-01-17 DIAGNOSIS — E7800 Pure hypercholesterolemia, unspecified: Secondary | ICD-10-CM

## 2022-01-17 MED ORDER — ATORVASTATIN CALCIUM 10 MG PO TABS: ORAL_TABLET | ORAL | 1 refills | Status: DC

## 2022-01-17 MED ORDER — MEFLOQUINE HCL 250 MG PO TABS: 250.0000 mg | ORAL_TABLET | ORAL | 0 refills | Status: DC

## 2022-01-17 MED ORDER — HYDROCHLOROTHIAZIDE 25 MG PO TABS: ORAL_TABLET | ORAL | 0 refills | Status: DC

## 2022-01-17 MED ORDER — LISINOPRIL 10 MG PO TABS: 10.0000 mg | ORAL_TABLET | Freq: Every day | ORAL | 1 refills | Status: DC

## 2022-01-17 MED ORDER — PANTOPRAZOLE SODIUM 40 MG PO TBEC: 40.0000 mg | DELAYED_RELEASE_TABLET | Freq: Every day | ORAL | 0 refills | Status: DC

## 2022-01-22 ENCOUNTER — Ambulatory Visit: Payer: Medicare HMO | Admitting: Internal Medicine

## 2022-01-25 ENCOUNTER — Ambulatory Visit: Payer: Medicare HMO | Admitting: Podiatry

## 2022-01-25 ENCOUNTER — Other Ambulatory Visit: Payer: Self-pay

## 2022-01-25 ENCOUNTER — Encounter: Payer: Self-pay | Admitting: Podiatry

## 2022-01-25 DIAGNOSIS — M778 Other enthesopathies, not elsewhere classified: Secondary | ICD-10-CM

## 2022-01-25 MED ORDER — TRIAMCINOLONE ACETONIDE 10 MG/ML IJ SUSP
20.0000 mg | Freq: Once | INTRAMUSCULAR | Status: AC
  Administered 2022-01-25: 20 mg

## 2022-01-27 NOTE — Progress Notes (Signed)
Subjective:  ? ?Patient ID: [name], female   DOB: 76 y.o.   MRN: [id number]  ? ?HPI ?Patient presents with pain on top of both feet with inflammation around the midtarsal joint.  Patient states that there has been some swelling associated with this ? ? ?ROS ? ? ?   ?Objective:  ?Physical Exam  ?Neurovascular status intact with discomfort which is developed over the last month or 2 after having had treatment last year for extensor tendinitis ? ?   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 ?   ?Plan:  ?H&P sterile prep and injected the extensor complex around the midtarsal joint bilateral 3 mg dexamethasone Kenalog 5 mg Xylocaine advised on topical medicines and wider shoes.  Reappoint as needed ? ?X-rays indicate there is some spurring of the midtarsal joint bilateral with no other indications of ?   ? ? ?

## 2022-02-19 ENCOUNTER — Ambulatory Visit (INDEPENDENT_AMBULATORY_CARE_PROVIDER_SITE_OTHER): Payer: Medicare HMO | Admitting: Internal Medicine

## 2022-02-19 ENCOUNTER — Encounter: Payer: Self-pay | Admitting: Internal Medicine

## 2022-02-19 ENCOUNTER — Other Ambulatory Visit: Payer: Self-pay | Admitting: Internal Medicine

## 2022-02-19 VITALS — BP 118/76 | HR 71 | Temp 98.2°F | Ht 67.0 in

## 2022-02-19 DIAGNOSIS — I129 Hypertensive chronic kidney disease with stage 1 through stage 4 chronic kidney disease, or unspecified chronic kidney disease: Secondary | ICD-10-CM

## 2022-02-19 DIAGNOSIS — N1831 Chronic kidney disease, stage 3a: Secondary | ICD-10-CM | POA: Diagnosis not present

## 2022-02-19 NOTE — Patient Instructions (Signed)
Exercising to Stay Healthy °To become healthy and stay healthy, it is recommended that you do moderate-intensity and vigorous-intensity exercise. You can tell that you are exercising at a moderate intensity if your heart starts beating faster and you start breathing faster but can still hold a conversation. You can tell that you are exercising at a vigorous intensity if you are breathing much harder and faster and cannot hold a conversation while exercising. °How can exercise benefit me? °Exercising regularly is important. It has many health benefits, such as: °Improving overall fitness, flexibility, and endurance. °Increasing bone density. °Helping with weight control. °Decreasing body fat. °Increasing muscle strength and endurance. °Reducing stress and tension, anxiety, depression, or anger. °Improving overall health. °What guidelines should I follow while exercising? °Before you start a new exercise program, talk with your health care provider. °Do not exercise so much that you hurt yourself, feel dizzy, or get very short of breath. °Wear comfortable clothes and wear shoes with good support. °Drink plenty of water while you exercise to prevent dehydration or heat stroke. °Work out until your breathing and your heartbeat get faster (moderate intensity). °How often should I exercise? °Choose an activity that you enjoy, and set realistic goals. Your health care provider can help you make an activity plan that is individually designed and works best for you. °Exercise regularly as told by your health care provider. This may include: °Doing strength training two times a week, such as: °Lifting weights. °Using resistance bands. °Push-ups. °Sit-ups. °Yoga. °Doing a certain intensity of exercise for a given amount of time. Choose from these options: °A total of 150 minutes of moderate-intensity exercise every week. °A total of 75 minutes of vigorous-intensity exercise every week. °A mix of moderate-intensity and  vigorous-intensity exercise every week. °Children, pregnant women, people who have not exercised regularly, people who are overweight, and older adults may need to talk with a health care provider about what activities are safe to perform. If you have a medical condition, be sure to talk with your health care provider before you start a new exercise program. °What are some exercise ideas? °Moderate-intensity exercise ideas include: °Walking 1 mile (1.6 km) in about 15 minutes. °Biking. °Hiking. °Golfing. °Dancing. °Water aerobics. °Vigorous-intensity exercise ideas include: °Walking 4.5 miles (7.2 km) or more in about 1 hour. °Jogging or running 5 miles (8 km) in about 1 hour. °Biking 10 miles (16.1 km) or more in about 1 hour. °Lap swimming. °Roller-skating or in-line skating. °Cross-country skiing. °Vigorous competitive sports, such as football, basketball, and soccer. °Jumping rope. °Aerobic dancing. °What are some everyday activities that can help me get exercise? °Yard work, such as: °Pushing a lawn mower. °Raking and bagging leaves. °Washing your car. °Pushing a stroller. °Shoveling snow. °Gardening. °Washing windows or floors. °How can I be more active in my day-to-day activities? °Use stairs instead of an elevator. °Take a walk during your lunch break. °If you drive, park your car farther away from your work or school. °If you take public transportation, get off one stop early and walk the rest of the way. °Stand up or walk around during all of your indoor phone calls. °Get up, stretch, and walk around every 30 minutes throughout the day. °Enjoy exercise with a friend. Support to continue exercising will help you keep a regular routine of activity. °Where to find more information °You can find more information about exercising to stay healthy from: °U.S. Department of Health and Human Services: www.hhs.gov °Centers for Disease Control and Prevention (  CDC): www.cdc.gov °Summary °Exercising regularly is  important. It will improve your overall fitness, flexibility, and endurance. °Regular exercise will also improve your overall health. It can help you control your weight, reduce stress, and improve your bone density. °Do not exercise so much that you hurt yourself, feel dizzy, or get very short of breath. °Before you start a new exercise program, talk with your health care provider. °This information is not intended to replace advice given to you by your health care provider. Make sure you discuss any questions you have with your health care provider. °Document Revised: 03/02/2021 Document Reviewed: 03/02/2021 °Elsevier Patient Education © 2022 Elsevier Inc. ° °

## 2022-02-19 NOTE — Progress Notes (Signed)
?[employer] as a Education administrator for Maximino Greenland, MD.,have documented all relevant documentation on the behalf of Maximino Greenland, MD,as directed by  Maximino Greenland, MD while in the presence of Maximino Greenland, MD.  ?This visit occurred during the SARS-CoV-2 public health emergency.  Safety protocols were in place, including screening questions prior to the visit, additional usage of staff PPE, and extensive cleaning of exam room while observing appropriate contact time as indicated for disinfecting solutions. ? ?Subjective:  ?  ? Patient ID: [name] , female    DOB: 09/22/1946 , 76 y.o.   MRN: [id number] ? ? ?Chief Complaint  ?Patient presents with  ? Hypertension  ? ? ?HPI ? ?The patient is here today for a blood pressure f/u.  She reports compliance with meds. She recently returned from her trip to [place], did not take malaria meds - she states they were not needed. She has no new concerns at this time.  ? ?Hypertension ?This is a chronic problem. The current episode started more than 1 year ago. The problem has been gradually improving since onset. The problem is controlled. Pertinent negatives include no orthopnea or shortness of breath. Risk factors for coronary artery disease include sedentary lifestyle. The current treatment provides moderate improvement.   ? ?Past Medical History:  ?Diagnosis Date  ? Back pain   ? Falls   ? Fibromyalgia   ? Hypertension   ? Joint pain   ? Joint stiffness   ? Joint swelling   ? Pure hypercholesterolemia 01/11/2020  ? Raynaud's disease   ? age 81's  ? Sleep apnea   ? Varicose veins of both lower extremities   ? Vitamin B12 deficiency   ? 2020  ? Vitamin D deficiency   ?  ? ?Family History  ?Problem Relation Age of Onset  ? Hypertension Mother   ? Hyperthyroidism Mother   ? Hypertension Father   ? Prostate cancer Father   ? Diabetes Brother   ? Breast cancer Paternal Aunt   ? Breast cancer Paternal Grandmother   ? Peripheral vascular disease Other    ? ? ? ?Current Outpatient Medications:  ?  acetaminophen (TYLENOL) 500 MG tablet, Take 1,000 mg by mouth daily as needed for moderate pain., Disp: , Rfl:  ?  atorvastatin (LIPITOR) 10 MG tablet, TAKE 1 TABLET(10 MG) BY MOUTH DAILY, Disp: 90 tablet, Rfl: 1 ?  hydrochlorothiazide (HYDRODIURIL) 25 MG tablet, TAKE 1 TABLET(25 MG) BY MOUTH DAILY, Disp: 30 tablet, Rfl: 0 ?  lisinopril (ZESTRIL) 10 MG tablet, Take 1 tablet (10 mg total) by mouth daily., Disp: 90 tablet, Rfl: 1 ?  Multiple Vitamin (MULTIVITAMIN) capsule, Take 1 capsule by mouth daily., Disp: , Rfl:  ?  pantoprazole (PROTONIX) 40 MG tablet, TAKE 1 TABLET(40 MG) BY MOUTH DAILY, Disp: 30 tablet, Rfl: 0  ? ?Allergies  ?Allergen Reactions  ? Penicillins   ?  Does not remember  ?  ? ?Review of Systems  ?Constitutional: Negative.   ?Respiratory: Negative.  Negative for shortness of breath.   ?Cardiovascular: Negative.  Negative for orthopnea.  ?Gastrointestinal: Negative.   ?Neurological: Negative.   ?Psychiatric/Behavioral: Negative.     ? ?Today's Vitals  ? 02/19/22 1441  ?BP: 118/76  ?Pulse: 71  ?Temp: 98.2 ?F (36.8 ?C)  ?Height: '5\' 7"'$  (1.702 m)  ? ?Body mass index is 35.47 kg/m?.  ?Wt Readings from Last 3 Encounters:  ?01/07/22 226 lb 8 oz (102.7 kg)  ?11/28/21 215 lb (97.5 kg)  ?  05/29/20 240 lb (108.9 kg)  ?  ?BP Readings from Last 3 Encounters:  ?02/19/22 118/76  ?01/07/22 (!) 147/82  ?12/10/21 114/82  ?  ?Physical Exam ?Vitals and nursing note reviewed.  ?Constitutional:   ?   Appearance: Normal appearance.  ?HENT:  ?   Head: Normocephalic and atraumatic.  ?Cardiovascular:  ?   Rate and Rhythm: Normal rate and regular rhythm.  ?   Heart sounds: Normal heart sounds.  ?Pulmonary:  ?   Effort: Pulmonary effort is normal.  ?   Breath sounds: Normal breath sounds.  ?Skin: ?   General: Skin is warm.  ?Neurological:  ?   General: No focal deficit present.  ?   Mental Status: She is alert.  ?Psychiatric:     ?   Mood and Affect: Mood normal.     ?   Behavior:  Behavior normal.  ?  ? ?   ?Assessment And Plan:  ?   ?1. Parenchymal renal hypertension, stage 1 through stage 4 or unspecified chronic kidney disease ?Comments: Chronic, well controlled. No med changes. She is encouraged to follow low sodium diet.  She will f/u in 4 months for re-evaluation.  ? ?2. Stage 3a chronic kidney disease ([place]) ?Comments: Chronic, she is reminded to avoid NSAIDs, stay well hydrated and keep BP well controlled.  ?  ?Patient was given opportunity to ask questions. Patient verbalized understanding of the plan and was able to repeat key elements of the plan. All questions were answered to their satisfaction.  ? ?I, Maximino Greenland, MD, have reviewed all documentation for this visit. The documentation on 02/19/22 for the exam, diagnosis, procedures, and orders are all accurate and complete.  ? ?IF YOU HAVE BEEN REFERRED TO A SPECIALIST, IT MAY TAKE 1-2 WEEKS TO SCHEDULE/PROCESS THE REFERRAL. IF YOU HAVE NOT HEARD FROM US/SPECIALIST IN TWO WEEKS, PLEASE GIVE [place] A CALL AT [phone].  ? ?THE PATIENT IS ENCOURAGED TO PRACTICE SOCIAL DISTANCING DUE TO THE COVID-19 PANDEMIC.   ?

## 2022-02-22 ENCOUNTER — Encounter: Payer: Self-pay | Admitting: Gastroenterology

## 2022-02-25 ENCOUNTER — Encounter: Admission: RE | Payer: Self-pay | Source: Home / Self Care

## 2022-02-25 ENCOUNTER — Ambulatory Visit: Admission: RE | Admit: 2022-02-25 | Payer: Medicare HMO | Source: Home / Self Care | Admitting: Gastroenterology

## 2022-02-25 SURGERY — ESOPHAGOGASTRODUODENOSCOPY (EGD) WITH PROPOFOL
Anesthesia: General

## 2022-03-24 ENCOUNTER — Other Ambulatory Visit: Payer: Self-pay | Admitting: Internal Medicine

## 2022-04-10 DIAGNOSIS — H26492 Other secondary cataract, left eye: Secondary | ICD-10-CM | POA: Diagnosis not present

## 2022-04-10 DIAGNOSIS — H35342 Macular cyst, hole, or pseudohole, left eye: Secondary | ICD-10-CM | POA: Diagnosis not present

## 2022-04-10 DIAGNOSIS — H353131 Nonexudative age-related macular degeneration, bilateral, early dry stage: Secondary | ICD-10-CM | POA: Diagnosis not present

## 2022-04-10 DIAGNOSIS — H35372 Puckering of macula, left eye: Secondary | ICD-10-CM | POA: Diagnosis not present

## 2022-04-10 DIAGNOSIS — H35363 Drusen (degenerative) of macula, bilateral: Secondary | ICD-10-CM | POA: Diagnosis not present

## 2022-04-10 DIAGNOSIS — H35411 Lattice degeneration of retina, right eye: Secondary | ICD-10-CM | POA: Diagnosis not present

## 2022-05-12 ENCOUNTER — Other Ambulatory Visit: Payer: Self-pay | Admitting: Internal Medicine

## 2022-05-12 DIAGNOSIS — I129 Hypertensive chronic kidney disease with stage 1 through stage 4 chronic kidney disease, or unspecified chronic kidney disease: Secondary | ICD-10-CM

## 2022-05-15 ENCOUNTER — Ambulatory Visit (INDEPENDENT_AMBULATORY_CARE_PROVIDER_SITE_OTHER): Payer: Medicare HMO

## 2022-05-15 ENCOUNTER — Ambulatory Visit (INDEPENDENT_AMBULATORY_CARE_PROVIDER_SITE_OTHER): Payer: Medicare HMO | Admitting: Internal Medicine

## 2022-05-15 ENCOUNTER — Encounter: Payer: Self-pay | Admitting: Internal Medicine

## 2022-05-15 VITALS — BP 120/62 | HR 79 | Temp 97.9°F

## 2022-05-15 DIAGNOSIS — I129 Hypertensive chronic kidney disease with stage 1 through stage 4 chronic kidney disease, or unspecified chronic kidney disease: Secondary | ICD-10-CM

## 2022-05-15 DIAGNOSIS — N1831 Chronic kidney disease, stage 3a: Secondary | ICD-10-CM

## 2022-05-15 DIAGNOSIS — Z0000 Encounter for general adult medical examination without abnormal findings: Secondary | ICD-10-CM | POA: Diagnosis not present

## 2022-05-15 DIAGNOSIS — F5101 Primary insomnia: Secondary | ICD-10-CM | POA: Diagnosis not present

## 2022-05-15 DIAGNOSIS — E7800 Pure hypercholesterolemia, unspecified: Secondary | ICD-10-CM | POA: Diagnosis not present

## 2022-05-15 DIAGNOSIS — R69 Illness, unspecified: Secondary | ICD-10-CM | POA: Diagnosis not present

## 2022-05-15 LAB — BMP8+EGFR
BUN/Creatinine Ratio: 18 (ref 12–28)
BUN: 18 mg/dL (ref 8–27)
CO2: 25 mmol/L (ref 20–29)
Calcium: 9.5 mg/dL (ref 8.7–10.3)
Chloride: 104 mmol/L (ref 96–106)
Creatinine, Ser: 1.01 mg/dL — ABNORMAL HIGH (ref 0.57–1.00)
Glucose: 70 mg/dL (ref 70–99)
Potassium: 4.7 mmol/L (ref 3.5–5.2)
Sodium: 141 mmol/L (ref 134–144)
eGFR: 58 mL/min/{1.73_m2} — ABNORMAL LOW (ref >59)

## 2022-05-15 MED ORDER — ATORVASTATIN CALCIUM 10 MG PO TABS: ORAL_TABLET | ORAL | 1 refills | Status: DC

## 2022-05-15 MED ORDER — HYDROCHLOROTHIAZIDE 25 MG PO TABS: ORAL_TABLET | ORAL | 1 refills | Status: DC

## 2022-05-15 MED ORDER — LISINOPRIL 10 MG PO TABS: 10.0000 mg | ORAL_TABLET | Freq: Every day | ORAL | 1 refills | Status: DC

## 2022-05-15 NOTE — Progress Notes (Signed)
Rich Brave [place],acting as a Education administrator for Maximino Greenland, MD.,have documented all relevant documentation on the behalf of Maximino Greenland, MD,as directed by  Maximino Greenland, MD while in the presence of Maximino Greenland, MD.  This visit occurred during the SARS-CoV-2 public health emergency.  Safety protocols were in place, including screening questions prior to the visit, additional usage of staff PPE, and extensive cleaning of exam room while observing appropriate contact time as indicated for disinfecting solutions.  Subjective:     Patient ID: [name] , female    DOB: Apr 24, 1946 , 76 y.o.   MRN: [id number]   Chief Complaint  Patient presents with   Hypertension    HPI  The patient is here today for a blood pressure f/u.  She reports compliance with meds. She denies headaches, chest pain and shortness of breath.   She is also scheduled for AWV with [facility] Advisor today.   Hypertension This is a chronic problem. The current episode started more than 1 year ago. The problem has been gradually improving since onset. The problem is controlled. Pertinent negatives include no orthopnea or shortness of breath. Risk factors for coronary artery disease include sedentary lifestyle. The current treatment provides moderate improvement.     Past Medical History:  Diagnosis Date   Back pain    Falls    Fibromyalgia    Hypertension    Joint pain    Joint stiffness    Joint swelling    Pure hypercholesterolemia 01/11/2020   Raynaud's disease    age 76's   Sleep apnea    Varicose veins of both lower extremities    Vitamin B12 deficiency    2020   Vitamin D deficiency      Family History  Problem Relation Age of Onset   Hypertension Mother    Hyperthyroidism Mother    Hypertension Father    Prostate cancer Father    Diabetes Brother    Breast cancer Paternal Aunt    Breast cancer Paternal Grandmother    Peripheral vascular disease Other      Current Outpatient  Medications:    acetaminophen (TYLENOL) 500 MG tablet, Take 1,000 mg by mouth daily as needed for moderate pain., Disp: , Rfl:    atorvastatin (LIPITOR) 10 MG tablet, TAKE 1 TABLET(10 MG) BY MOUTH DAILY, Disp: 90 tablet, Rfl: 1   Black Pepper-Turmeric (TURMERIC COMPLEX/BLACK PEPPER PO), Take 1 tablet by mouth daily., Disp: , Rfl:    Doxylamine Succinate, Sleep, (SLEEP AID PO), Take 1 tablet by mouth daily. Takes half a tab at bedtime, Disp: , Rfl:    hydrochlorothiazide (HYDRODIURIL) 25 MG tablet, One tab po qd, Disp: 90 tablet, Rfl: 1   lisinopril (ZESTRIL) 10 MG tablet, Take 1 tablet (10 mg total) by mouth daily., Disp: 90 tablet, Rfl: 1   Melatonin 1 MG CAPS, Take 1 capsule by mouth daily., Disp: , Rfl:    Multiple Vitamin (MULTIVITAMIN) capsule, Take 1 capsule by mouth daily., Disp: , Rfl:    pantoprazole (PROTONIX) 40 MG tablet, TAKE 1 TABLET(40 MG) BY MOUTH DAILY (Patient not taking: Reported on 05/15/2022), Disp: 30 tablet, Rfl: 0   Allergies  Allergen Reactions   Penicillins     Does not remember     Review of Systems  Constitutional: Negative.   Respiratory: Negative.  Negative for shortness of breath.   Cardiovascular: Negative.  Negative for orthopnea.  Endocrine: Negative for polydipsia, polyphagia and polyuria.  Neurological: Negative.  Psychiatric/Behavioral:  Positive for sleep disturbance.        She c/o issues sleeping. She states her sx have worsened in the past several months. She denies feeling stressed. States her mind doesn't settle down.  She drinks one cup of coffee daily. No more caffeine the remainder of the day.       Today's Vitals   05/15/22 0918  BP: 120/62  Pulse: 79  Temp: 97.9 F (36.6 C)   There is no height or weight on file to calculate BMI.   Objective:  Physical Exam Vitals and nursing note reviewed.  Constitutional:      Appearance: Normal appearance.  HENT:     Head: Normocephalic and atraumatic.  Eyes:     Extraocular Movements:  Extraocular movements intact.  Cardiovascular:     Rate and Rhythm: Normal rate and regular rhythm.     Heart sounds: Normal heart sounds.  Pulmonary:     Effort: Pulmonary effort is normal.     Breath sounds: Normal breath sounds.  Musculoskeletal:     Cervical back: Normal range of motion.  Skin:    General: Skin is warm.  Neurological:     General: No focal deficit present.     Mental Status: She is alert.  Psychiatric:        Mood and Affect: Mood normal.        Behavior: Behavior normal.       Assessment And Plan:     1. Parenchymal renal hypertension, stage 1 through stage 4 or unspecified chronic kidney disease Comments: Chronic, well controlled. I will send rx lisinopril and hctz refills. I will check renal function today.  She will rto in Oct 2023 for her next physical exam.  - lisinopril (ZESTRIL) 10 MG tablet; Take 1 tablet (10 mg total) by mouth daily.  Dispense: 90 tablet; Refill: 1 - hydrochlorothiazide (HYDRODIURIL) 25 MG tablet; One tab po qd  Dispense: 90 tablet; Refill: 1 - BMP8+eGFR  2. Stage 3a chronic kidney disease ([place]) Comments: Chronic, I will check renal function today. She is reminded to avoid NSAIDs to decrease cardiac risk.   3. Pure hypercholesterolemia Comments: Chronic, on atorvastatin. I will send refill to the local pharmacy. I will check fasting lipid panel at her physical in October 2023. - atorvastatin (LIPITOR) 10 MG tablet; TAKE 1 TABLET(10 MG) BY MOUTH DAILY  Dispense: 90 tablet; Refill: 1  4. Primary insomnia Comments: She was given samples of B[place]ra to take nightly. She was given 74m, advised to take an hour before bedtime.    Patient was given opportunity to ask questions. Patient verbalized understanding of the plan and was able to repeat key elements of the plan. All questions were answered to their satisfaction.   I, RMaximino Greenland MD, have reviewed all documentation for this visit. The documentation on 05/15/22 for the exam,  diagnosis, procedures, and orders are all accurate and complete.   IF YOU HAVE BEEN REFERRED TO A SPECIALIST, IT MAY TAKE 1-2 WEEKS TO SCHEDULE/PROCESS THE REFERRAL. IF YOU HAVE NOT HEARD FROM US/SPECIALIST IN TWO WEEKS, PLEASE GIVE [place] CALL AT [phone].   THE PATIENT IS ENCOURAGED TO PRACTICE SOCIAL DISTANCING DUE TO THE COVID-19 PANDEMIC.

## 2022-05-15 NOTE — Patient Instructions (Signed)
Hypertension, Adult ?Hypertension is another name for high blood pressure. High blood pressure forces your heart to work harder to pump blood. This can cause problems over time. ?There are two numbers in a blood pressure reading. There is a top number (systolic) over a bottom number (diastolic). It is best to have a blood pressure that is below 120/80. ?What are the causes? ?The cause of this condition is not known. Some other conditions can lead to high blood pressure. ?What increases the risk? ?Some lifestyle factors can make you more likely to develop high blood pressure: ?Smoking. ?Not getting enough exercise or physical activity. ?Being overweight. ?Having too much fat, sugar, calories, or salt (sodium) in your diet. ?Drinking too much alcohol. ?Other risk factors include: ?Having any of these conditions: ?Heart disease. ?Diabetes. ?High cholesterol. ?Kidney disease. ?Obstructive sleep apnea. ?Having a family history of high blood pressure and high cholesterol. ?Age. The risk increases with age. ?Stress. ?What are the signs or symptoms? ?High blood pressure may not cause symptoms. Very high blood pressure (hypertensive crisis) may cause: ?Headache. ?Fast or uneven heartbeats (palpitations). ?Shortness of breath. ?Nosebleed. ?Vomiting or feeling like you may vomit (nauseous). ?Changes in how you see. ?Very bad chest pain. ?Feeling dizzy. ?Seizures. ?How is this treated? ?This condition is treated by making healthy lifestyle changes, such as: ?Eating healthy foods. ?Exercising more. ?Drinking less alcohol. ?Your doctor may prescribe medicine if lifestyle changes do not help enough and if: ?Your top number is above 130. ?Your bottom number is above 80. ?Your personal target blood pressure may vary. ?Follow these instructions at home: ?Eating and drinking ? ?If told, follow the DASH eating plan. To follow this plan: ?Fill one half of your plate at each meal with fruits and vegetables. ?Fill one fourth of your plate  at each meal with whole grains. Whole grains include whole-wheat pasta, brown rice, and whole-grain bread. ?Eat or drink low-fat dairy products, such as skim milk or low-fat yogurt. ?Fill one fourth of your plate at each meal with low-fat (lean) proteins. Low-fat proteins include fish, chicken without skin, eggs, beans, and tofu. ?Avoid fatty meat, cured and processed meat, or chicken with skin. ?Avoid pre-made or processed food. ?Limit the amount of salt in your diet to less than 1,500 mg each day. ?Do not drink alcohol if: ?Your doctor tells you not to drink. ?You are pregnant, may be pregnant, or are planning to become pregnant. ?If you drink alcohol: ?Limit how much you have to: ?0-1 drink a day for women. ?0-2 drinks a day for men. ?Know how much alcohol is in your drink. In the [place] one drink equals one 12 oz bottle of beer (355 mL), one 5 oz glass of wine (148 mL), or one 1? oz glass of hard liquor (44 mL). ?Lifestyle ? ?Work with your doctor to stay at a healthy weight or to lose weight. Ask your doctor what the best weight is for you. ?Get at least 30 minutes of exercise that causes your heart to beat faster (aerobic exercise) most days of the week. This may include walking, swimming, or biking. ?Get at least 30 minutes of exercise that strengthens your muscles (resistance exercise) at least 3 days a week. This may include lifting weights or doing Pilates. ?Do not smoke or use any products that contain nicotine or tobacco. If you need help quitting, ask your doctor. ?Check your blood pressure at home as told by your doctor. ?Keep all follow-up visits. ?Medicines ?Take over-the-counter and prescription medicines   only as told by your doctor. Follow directions carefully. ?Do not skip doses of blood pressure medicine. The medicine does not work as well if you skip doses. Skipping doses also puts you at risk for problems. ?Ask your doctor about side effects or reactions to medicines that you should watch  for. ?Contact a doctor if: ?You think you are having a reaction to the medicine you are taking. ?You have headaches that keep coming back. ?You feel dizzy. ?You have swelling in your ankles. ?You have trouble with your vision. ?Get help right away if: ?You get a very bad headache. ?You start to feel mixed up (confused). ?You feel weak or numb. ?You feel faint. ?You have very bad pain in your: ?Chest. ?Belly (abdomen). ?You vomit more than once. ?You have trouble breathing. ?These symptoms may be an emergency. Get help right away. Call 911. ?Do not wait to see if the symptoms will go away. ?Do not drive yourself to the hospital. ?Summary ?Hypertension is another name for high blood pressure. ?High blood pressure forces your heart to work harder to pump blood. ?For most people, a normal blood pressure is less than 120/80. ?Making healthy choices can help lower blood pressure. If your blood pressure does not get lower with healthy choices, you may need to take medicine. ?This information is not intended to replace advice given to you by your health care provider. Make sure you discuss any questions you have with your health care provider. ?Document Revised: 08/23/2021 Document Reviewed: 08/23/2021 ?Elsevier Patient Education ? 2023 [employer] ? ?

## 2022-05-15 NOTE — Progress Notes (Signed)
Subjective:   [name] is a 76 y.o. female who presents for Medicare Annual (Subsequent) preventive examination.  Review of Systems     Cardiac Risk Factors include: advanced age (>22mn, >>45women);hypertension     Objective:    Today's Vitals   05/15/22 0908  BP: 120/62  Pulse: 79  Temp: 97.9 F (36.6 C)  TempSrc: Oral  SpO2: 99%   There is no height or weight on file to calculate BMI.     05/15/2022    9:21 AM 11/28/2021   10:55 PM 04/19/2021   10:24 AM 07/20/2020   11:02 AM 07/15/2019   10:13 AM 02/09/2019   10:58 AM  Advanced Directives  Does Patient Have a Medical Advance Directive? No No No No No No  Would patient like information on creating a medical advance directive? No - Patient declined  No - Patient declined       Current Medications (verified) Outpatient Encounter Medications as of 05/15/2022  Medication Sig   acetaminophen (TYLENOL) 500 MG tablet Take 1,000 mg by mouth daily as needed for moderate pain.   atorvastatin (LIPITOR) 10 MG tablet TAKE 1 TABLET(10 MG) BY MOUTH DAILY   Black Pepper-Turmeric (TURMERIC COMPLEX/BLACK PEPPER PO) Take 1 tablet by mouth daily.   Doxylamine Succinate, Sleep, (SLEEP AID PO) Take 1 tablet by mouth daily. Takes half a tab at bedtime   hydrochlorothiazide (HYDRODIURIL) 25 MG tablet TAKE 1 TABLET(25 MG) BY MOUTH DAILY   lisinopril (ZESTRIL) 10 MG tablet Take 1 tablet (10 mg total) by mouth daily.   Melatonin 1 MG CAPS Take 1 capsule by mouth daily.   Multiple Vitamin (MULTIVITAMIN) capsule Take 1 capsule by mouth daily.   pantoprazole (PROTONIX) 40 MG tablet TAKE 1 TABLET(40 MG) BY MOUTH DAILY (Patient not taking: Reported on 05/15/2022)   No facility-administered encounter medications on file as of 05/15/2022.    Allergies (verified) Penicillins   History: Past Medical History:  Diagnosis Date   Back pain    Falls    Fibromyalgia    Hypertension    Joint pain    Joint stiffness    Joint swelling    Pure  hypercholesterolemia 01/11/2020   Raynaud's disease    age 76's  Sleep apnea    Varicose veins of both lower extremities    Vitamin B12 deficiency    2020   Vitamin D deficiency    Past Surgical History:  Procedure Laterality Date   BREAST BIOPSY Right    BREAST EXCISIONAL BIOPSY Right    FOOT FRACTURE SURGERY Right    FRACTURE SURGERY     KNEE SURGERY Left 2007   TUBAL LIGATION     Family History  Problem Relation Age of Onset   Hypertension Mother    Hyperthyroidism Mother    Hypertension Father    Prostate cancer Father    Diabetes Brother    Breast cancer Paternal Aunt    Breast cancer Paternal Grandmother    Peripheral vascular disease Other    Social History   Socioeconomic History   Marital status: Divorced    Spouse name: Not on file   Number of children: Not on file   Years of education: Not on file   Highest education level: Not on file  Occupational History   Not on file  Tobacco Use   Smoking status: Former   Smokeless tobacco: Never   Tobacco comments:    quit at age 75,only smoked for 1-2 years  V36  Use   Vaping Use: Never used  Substance and Sexual Activity   Alcohol use: Yes    Comment: 2 drinks weekly   Drug use: No   Sexual activity: Not Currently  Other Topics Concern   Not on file  Social History Narrative   Not on file   Social Determinants of Health   Financial Resource Strain: Low Risk  (05/15/2022)   Overall Financial Resource Strain (CARDIA)    Difficulty of Paying Living Expenses: Not hard at all  Food Insecurity: No Food Insecurity (05/15/2022)   Hunger Vital Sign    Worried About Running Out of Food in the Last Year: Never true    Ran Out of Food in the Last Year: Never true  Transportation Needs: No Transportation Needs (05/15/2022)   PRAPARE - Hydrologist (Medical): No    Lack of Transportation (Non-Medical): No  Physical Activity: Inactive (05/15/2022)   Exercise Vital Sign    Days of  Exercise per Week: 0 days    Minutes of Exercise per Session: 0 min  Stress: No Stress Concern Present (05/15/2022)   [place]    Feeling of Stress : Not at all  Social Connections: Not on file    Tobacco Counseling Counseling given: Not Answered Tobacco comments: quit at age 24,only smoked for 1-2 years   Clinical Intake:  Pre-visit preparation completed: Yes  Pain : 0-10 Pain Score:  (varies) Pain Type: Chronic pain Pain Location: Hand Pain Orientation: Right Pain Descriptors / Indicators: Aching Pain Onset: More than a month ago Pain Frequency: Intermittent     Nutritional Risks: None Diabetes: No  How often do you need to have someone help you when you read instructions, pamphlets, or other written materials from your doctor or pharmacy?: 1 - Never  Diabetic? no  Interpreter Needed?: No  Information entered by :: NAllen LPN   Activities of Daily Living    05/15/2022    9:23 AM  In your present state of health, do you have any difficulty performing the following activities:  Hearing? 0  Vision? 0  Difficulty concentrating or making decisions? 0  Walking or climbing stairs? 0  Dressing or bathing? 0  Doing errands, shopping? 0  Preparing Food and eating ? N  Using the Toilet? N  In the past six months, have you accidently leaked urine? N  Do you have problems with loss of bowel control? N  Managing your Medications? N  Managing your Finances? N  Housekeeping or managing your Housekeeping? N    Patient Care Team: Glendale Chard, MD as PCP - General (Internal Medicine) Skeet Latch, MD as PCP - Cardiology (Cardiology)  Indicate any recent Medical Services you may have received from other than [facility] providers in the past year (date may be approximate).     Assessment:   This is a routine wellness examination for [place].  Hearing/Vision screen Vision Screening - Comments::  Regular eye exams, Dr. Posey Pronto  Dietary issues and exercise activities discussed: Current Exercise Habits: The patient does not participate in regular exercise at present   Goals Addressed             This Visit's Progress    Patient Stated       05/15/2022, alleviate pain in knees, lose weight, maintain state of mind       Depression Screen    05/15/2022    9:23 AM 04/19/2021   10:24  AM 07/20/2020   11:03 AM 04/18/2020    9:44 AM 12/09/2019   11:16 AM 07/15/2019   10:13 AM 07/08/2019    2:54 PM  PHQ 2/9 Scores  PHQ - 2 Score 0 0 0 2 0 0 0  PHQ- 9 Score    8  1     Fall Risk    05/15/2022    9:23 AM 04/19/2021   10:24 AM 07/20/2020   11:03 AM 12/09/2019   11:16 AM 07/15/2019   10:13 AM  Fall Risk   Falls in the past year? 0 0 0 0 0  Number falls in past yr: 0      Injury with Fall? 0      Risk for fall due to : Medication side effect Medication side effect Medication side effect  Medication side effect  Follow up Falls evaluation completed;Education provided;Falls prevention discussed Falls evaluation completed;Education provided;Falls prevention discussed Falls evaluation completed;Education provided;Falls prevention discussed  Falls evaluation completed;Education provided;Falls prevention discussed    FALL RISK PREVENTION PERTAINING TO THE HOME:  Any stairs in or around the home? Yes  If so, are there any without handrails? No  Home free of loose throw rugs in walkways, pet beds, electrical cords, etc? Yes  Adequate lighting in your home to reduce risk of falls? Yes   ASSISTIVE DEVICES UTILIZED TO PREVENT FALLS:  Life alert? No  Use of a cane, walker or w/c? No  Grab bars in the bathroom? No  Shower chair or bench in shower? No  Elevated toilet seat or a handicapped toilet? Yes   TIMED UP AND GO:  Was the test performed? No .    Gait steady and fast without use of assistive device  Cognitive Function:        05/15/2022    9:24 AM 07/20/2020   11:05 AM 07/15/2019    10:16 AM  6CIT Screen  What Year? 0 points 0 points 0 points  What month? 0 points 0 points 0 points  What time? 0 points 0 points 0 points  Count back from 20 0 points 0 points 0 points  Months in reverse 0 points 0 points 0 points  Repeat phrase 0 points 0 points 0 points  Total Score 0 points 0 points 0 points    Immunizations Immunization History  Administered Date(s) Administered   DTaP 06/10/2013   Fluad Quad(high Dose 65+) 07/26/2020, 08/08/2021   Influenza Inj Mdck Quad Pf 07/21/2019   Influenza, High Dose Seasonal PF 09/11/2018   Influenza-Unspecified 07/21/2019   PFIZER Comirnaty(Gray Top)Covid-19 Tri-Sucrose Vaccine 06/11/2021   PFIZER(Purple Top)SARS-COV-2 Vaccination 12/23/2019, 01/13/2020, 08/14/2020   Pfizer Covid-19 Vaccine Bivalent Booster 26yr & up 12/10/2021   Pneumococcal Conjugate-13 02/01/2019, 02/02/2019   Pneumococcal Polysaccharide-23 11/28/2021   Pneumococcal-Unspecified 06/10/2013   Tdap 02/16/2018   Zoster Recombinat (Shingrix) 05/17/2021, 11/28/2021    TDAP status: Up to date  Flu Vaccine status: Up to date  Pneumococcal vaccine status: Up to date  Covid-19 vaccine status: Completed vaccines  Qualifies for Shingles Vaccine? Yes   Zostavax completed Yes   Shingrix Completed?: Yes  Screening Tests Health Maintenance  Topic Date Due   INFLUENZA VACCINE  06/18/2022   COLONOSCOPY (Pts [phone] coverage will need to be confirmed)  06/06/2027   TETANUS/TDAP  02/17/2028   Pneumonia Vaccine 6518Years old  Completed   DEXA SCAN  Completed   COVID-19 Vaccine  Completed   Hepatitis C Screening  Completed   Zoster Vaccines- Shingrix  Completed   HPV VACCINES  Aged Out    Health Maintenance  There are no preventive care reminders to display for this patient.  Colorectal cancer screening: Type of screening: Colonoscopy. Completed 06/05/2017. Repeat every 10 years  Mammogram status: Completed 09/26/2021. Repeat every year  Bone  Density status: Completed 05/31/2021.  Lung Cancer Screening: (Low Dose CT Chest recommended if Age 32-80 years, 30 pack-year currently smoking OR have quit w/in 15years.) does not qualify.   Lung Cancer Screening Referral: no  Additional Screening:  Hepatitis C Screening: does qualify; Completed 05/05/2017  Vision Screening: Recommended annual ophthalmology exams for early detection of glaucoma and other disorders of the eye. Is the patient up to date with their annual eye exam?  Yes  Who is the provider or what is the name of the office in which the patient attends annual eye exams? Dr. Posey Pronto If pt is not established with a provider, would they like to be referred to a provider to establish care? No .   Dental Screening: Recommended annual dental exams for proper oral hygiene  Community Resource Referral / Chronic Care Management: CRR required this visit?  No   CCM required this visit?  No      Plan:     I have personally reviewed and noted the following in the patient's chart:   Medical and social history Use of alcohol, tobacco or illicit drugs  Current medications and supplements including opioid prescriptions.  Functional ability and status Nutritional status Physical activity Advanced directives List of other physicians Hospitalizations, surgeries, and ER visits in previous 12 months Vitals Screenings to include cognitive, depression, and falls Referrals and appointments  In addition, I have reviewed and discussed with patient certain preventive protocols, quality metrics, and best practice recommendations. A written personalized care plan for preventive services as well as general preventive health recommendations were provided to patient.     Kellie Simmering, LPN   1/61/0960   Nurse Notes: none

## 2022-05-15 NOTE — Patient Instructions (Signed)
Ms. Carthen , Thank you for taking time to come for your Medicare Wellness Visit. I appreciate your ongoing commitment to your health goals. Please review the following plan we discussed and let me know if I can assist you in the future.   Screening recommendations/referrals: Colonoscopy: completed 06/05/2017 Mammogram: completed 09/26/2021 Bone Density: completed 05/31/2021 Recommended yearly ophthalmology/optometry visit for glaucoma screening and checkup Recommended yearly dental visit for hygiene and checkup  Vaccinations: Influenza vaccine: due 06/18/2022 Pneumococcal vaccine: completed 11/28/2021 Tdap vaccine: completed 02/16/2018, due 02/17/2028 Shingles vaccine: completed   Covid-19: 12/10/2021, 06/11/2021, 08/14/2020, 01/13/2020, 12/23/2019  Advanced directives:   Conditions/risks identified: none  Next appointment: Follow up in one year for your annual wellness visit    Preventive Care 75 Years and Older, Female Preventive care refers to lifestyle choices and visits with your health care provider that can promote health and wellness. What does preventive care include? A yearly physical exam. This is also called an annual well check. Dental exams once or twice a year. Routine eye exams. Ask your health care provider how often you should have your eyes checked. Personal lifestyle choices, including: Daily care of your teeth and gums. Regular physical activity. Eating a healthy diet. Avoiding tobacco and drug use. Limiting alcohol use. Practicing safe sex. Taking low-dose aspirin every day. Taking vitamin and mineral supplements as recommended by your health care provider. What happens during an annual well check? The services and screenings done by your health care provider during your annual well check will depend on your age, overall health, lifestyle risk factors, and family history of disease. Counseling  Your health care provider may ask you questions about your: Alcohol  use. Tobacco use. Drug use. Emotional well-being. Home and relationship well-being. Sexual activity. Eating habits. History of falls. Memory and ability to understand (cognition). Work and work Statistician. Reproductive health. Screening  You may have the following tests or measurements: Height, weight, and BMI. Blood pressure. Lipid and cholesterol levels. These may be checked every 5 years, or more frequently if you are over 22 years old. Skin check. Lung cancer screening. You may have this screening every year starting at age 75 if you have a 30-pack-year history of smoking and currently smoke or have quit within the past 15 years. Fecal occult blood test (FOBT) of the stool. You may have this test every year starting at age 75. Flexible sigmoidoscopy or colonoscopy. You may have a sigmoidoscopy every 5 years or a colonoscopy every 10 years starting at age 75. Hepatitis C blood test. Hepatitis B blood test. Sexually transmitted disease (STD) testing. Diabetes screening. This is done by checking your blood sugar (glucose) after you have not eaten for a while (fasting). You may have this done every 1-3 years. Bone density scan. This is done to screen for osteoporosis. You may have this done starting at age 75. Mammogram. This may be done every 1-2 years. Talk to your health care provider about how often you should have regular mammograms. Talk with your health care provider about your test results, treatment options, and if necessary, the need for more tests. Vaccines  Your health care provider may recommend certain vaccines, such as: Influenza vaccine. This is recommended every year. Tetanus, diphtheria, and acellular pertussis (Tdap, Td) vaccine. You may need a Td booster every 10 years. Zoster vaccine. You may need this after age 18. Pneumococcal 13-valent conjugate (PCV13) vaccine. One dose is recommended after age 64. Pneumococcal polysaccharide (PPSV23) vaccine. One dose is  recommended after  age 75. Talk to your health care provider about which screenings and vaccines you need and how often you need them. This information is not intended to replace advice given to you by your health care provider. Make sure you discuss any questions you have with your health care provider. Document Released: 12/01/2015 Document Revised: 07/24/2016 Document Reviewed: 09/05/2015 Elsevier Interactive Patient Education  2017 [place] Prevention in the Home Falls can cause injuries. They can happen to people of all ages. There are many things you can do to make your home safe and to help prevent falls. What can I do on the outside of my home? Regularly fix the edges of walkways and driveways and fix any cracks. Remove anything that might make you trip as you walk through a door, such as a raised step or threshold. Trim any bushes or trees on the path to your home. Use bright outdoor lighting. Clear any walking paths of anything that might make someone trip, such as rocks or tools. Regularly check to see if handrails are loose or broken. Make sure that both sides of any steps have handrails. Any raised decks and porches should have guardrails on the edges. Have any leaves, snow, or ice cleared regularly. Use sand or salt on walking paths during winter. Clean up any spills in your garage right away. This includes oil or grease spills. What can I do in the bathroom? Use night lights. Install grab bars by the toilet and in the tub and shower. Do not use towel bars as grab bars. Use non-skid mats or decals in the tub or shower. If you need to sit down in the shower, use a plastic, non-slip stool. Keep the floor dry. Clean up any water that spills on the floor as soon as it happens. Remove soap buildup in the tub or shower regularly. Attach bath mats securely with double-sided non-slip rug tape. Do not have throw rugs and other things on the floor that can make you  trip. What can I do in the bedroom? Use night lights. Make sure that you have a light by your bed that is easy to reach. Do not use any sheets or blankets that are too big for your bed. They should not hang down onto the floor. Have a firm chair that has side arms. You can use this for support while you get dressed. Do not have throw rugs and other things on the floor that can make you trip. What can I do in the kitchen? Clean up any spills right away. Avoid walking on wet floors. Keep items that you use a lot in easy-to-reach places. If you need to reach something above you, use a strong step stool that has a grab bar. Keep electrical cords out of the way. Do not use floor polish or wax that makes floors slippery. If you must use wax, use non-skid floor wax. Do not have throw rugs and other things on the floor that can make you trip. What can I do with my stairs? Do not leave any items on the stairs. Make sure that there are handrails on both sides of the stairs and use them. Fix handrails that are broken or loose. Make sure that handrails are as long as the stairways. Check any carpeting to make sure that it is firmly attached to the stairs. Fix any carpet that is loose or worn. Avoid having throw rugs at the top or bottom of the stairs. If you do have  throw rugs, attach them to the floor with carpet tape. Make sure that you have a light switch at the top of the stairs and the bottom of the stairs. If you do not have them, ask someone to add them for you. What else can I do to help prevent falls? Wear shoes that: Do not have high heels. Have rubber bottoms. Are comfortable and fit you well. Are closed at the toe. Do not wear sandals. If you use a stepladder: Make sure that it is fully opened. Do not climb a closed stepladder. Make sure that both sides of the stepladder are locked into place. Ask someone to hold it for you, if possible. Clearly mark and make sure that you can  see: Any grab bars or handrails. First and last steps. Where the edge of each step is. Use tools that help you move around (mobility aids) if they are needed. These include: Canes. Walkers. Scooters. Crutches. Turn on the lights when you go into a dark area. Replace any light bulbs as soon as they burn out. Set up your furniture so you have a clear path. Avoid moving your furniture around. If any of your floors are uneven, fix them. If there are any pets around you, be aware of where they are. Review your medicines with your doctor. Some medicines can make you feel dizzy. This can increase your chance of falling. Ask your doctor what other things that you can do to help prevent falls. This information is not intended to replace advice given to you by your health care provider. Make sure you discuss any questions you have with your health care provider. Document Released: 08/31/2009 Document Revised: 04/11/2016 Document Reviewed: 12/09/2014 Elsevier Interactive Patient Education  2017 [employer].

## 2022-06-26 ENCOUNTER — Encounter (INDEPENDENT_AMBULATORY_CARE_PROVIDER_SITE_OTHER): Payer: Self-pay

## 2022-07-10 ENCOUNTER — Ambulatory Visit (INDEPENDENT_AMBULATORY_CARE_PROVIDER_SITE_OTHER): Payer: Medicare HMO

## 2022-07-10 ENCOUNTER — Ambulatory Visit: Payer: Medicare HMO | Admitting: Podiatry

## 2022-07-10 DIAGNOSIS — M778 Other enthesopathies, not elsewhere classified: Secondary | ICD-10-CM

## 2022-07-10 DIAGNOSIS — M7661 Achilles tendinitis, right leg: Secondary | ICD-10-CM | POA: Diagnosis not present

## 2022-07-10 MED ORDER — TRIAMCINOLONE ACETONIDE 10 MG/ML IJ SUSP
10.0000 mg | Freq: Once | INTRAMUSCULAR | Status: AC
  Administered 2022-07-10: 10 mg

## 2022-07-10 MED ORDER — DICLOFENAC SODIUM 75 MG PO TBEC: 75.0000 mg | DELAYED_RELEASE_TABLET | Freq: Two times a day (BID) | ORAL | 2 refills | Status: DC

## 2022-07-10 NOTE — Patient Instructions (Signed)

## 2022-07-10 NOTE — Progress Notes (Signed)
Subjective:   Patient ID: [name], female   DOB: 76 y.o.   MRN: [id number]   HPI Patient presents with a lot of pain in the back of the right heel states its been very sore and she is going to [place] in November.  States its been hard to be active or wear any type of shoe gear and she does not remember injury   ROS      Objective:  Physical Exam  Neurovascular status intact with exquisite discomfort posterior aspect right heel at the insertion of the Achilles into the calcaneus lateral side no central or medial pain was noted with range of motion adequate no indication muscle strength loss     Assessment:  Acute Achilles tendinitis right with inflammation     Plan:  H&P x-ray reviewed went ahead discussed injection explained chances for problems including rupture associated with this and she is willing to accept risk.  Today I did a sterile prep injected the lateral side at the insertion 3 mg dexamethasone Kenalog 5 mg Xylocaine and then applied air fracture walker to completely immobilize reappoint 1 month or earlier if needed can start stretching exercises in several weeks along with ice therapy  X-rays indicate spur formation right over left heel

## 2022-08-13 ENCOUNTER — Encounter: Payer: Self-pay | Admitting: Internal Medicine

## 2022-08-13 DIAGNOSIS — M1990 Unspecified osteoarthritis, unspecified site: Secondary | ICD-10-CM | POA: Diagnosis not present

## 2022-08-13 DIAGNOSIS — I129 Hypertensive chronic kidney disease with stage 1 through stage 4 chronic kidney disease, or unspecified chronic kidney disease: Secondary | ICD-10-CM | POA: Diagnosis not present

## 2022-08-13 DIAGNOSIS — Z833 Family history of diabetes mellitus: Secondary | ICD-10-CM | POA: Diagnosis not present

## 2022-08-13 DIAGNOSIS — Z008 Encounter for other general examination: Secondary | ICD-10-CM | POA: Diagnosis not present

## 2022-08-13 DIAGNOSIS — E785 Hyperlipidemia, unspecified: Secondary | ICD-10-CM | POA: Diagnosis not present

## 2022-08-13 DIAGNOSIS — E669 Obesity, unspecified: Secondary | ICD-10-CM | POA: Diagnosis not present

## 2022-08-13 DIAGNOSIS — Z87891 Personal history of nicotine dependence: Secondary | ICD-10-CM | POA: Diagnosis not present

## 2022-08-13 DIAGNOSIS — Z6831 Body mass index (BMI) 31.0-31.9, adult: Secondary | ICD-10-CM | POA: Diagnosis not present

## 2022-08-13 DIAGNOSIS — Z8249 Family history of ischemic heart disease and other diseases of the circulatory system: Secondary | ICD-10-CM | POA: Diagnosis not present

## 2022-08-13 DIAGNOSIS — N1831 Chronic kidney disease, stage 3a: Secondary | ICD-10-CM | POA: Diagnosis not present

## 2022-08-13 DIAGNOSIS — Z880 Allergy status to penicillin: Secondary | ICD-10-CM | POA: Diagnosis not present

## 2022-08-20 ENCOUNTER — Encounter: Payer: Self-pay | Admitting: Internal Medicine

## 2022-08-20 ENCOUNTER — Ambulatory Visit (INDEPENDENT_AMBULATORY_CARE_PROVIDER_SITE_OTHER): Payer: Medicare HMO | Admitting: Internal Medicine

## 2022-08-20 VITALS — BP 122/80 | HR 65 | Temp 98.2°F

## 2022-08-20 DIAGNOSIS — I129 Hypertensive chronic kidney disease with stage 1 through stage 4 chronic kidney disease, or unspecified chronic kidney disease: Secondary | ICD-10-CM

## 2022-08-20 DIAGNOSIS — Z23 Encounter for immunization: Secondary | ICD-10-CM

## 2022-08-20 DIAGNOSIS — N1831 Chronic kidney disease, stage 3a: Secondary | ICD-10-CM

## 2022-08-20 DIAGNOSIS — Z0000 Encounter for general adult medical examination without abnormal findings: Secondary | ICD-10-CM | POA: Diagnosis not present

## 2022-08-20 DIAGNOSIS — N950 Postmenopausal bleeding: Secondary | ICD-10-CM

## 2022-08-20 DIAGNOSIS — I739 Peripheral vascular disease, unspecified: Secondary | ICD-10-CM

## 2022-08-20 DIAGNOSIS — E7800 Pure hypercholesterolemia, unspecified: Secondary | ICD-10-CM

## 2022-08-20 LAB — POCT URINALYSIS DIPSTICK
Bilirubin, UA: NEGATIVE
Glucose, UA: NEGATIVE
Ketones, UA: NEGATIVE
Leukocytes, UA: NEGATIVE
Nitrite, UA: NEGATIVE
Protein, UA: NEGATIVE
Spec Grav, UA: 1.02 (ref 1.010–1.025)
Urobilinogen, UA: 0.2 E.U./dL
pH, UA: 7 (ref 5.0–8.0)

## 2022-08-20 NOTE — Patient Instructions (Signed)

## 2022-08-20 NOTE — Progress Notes (Unsigned)
Rich Brave [place]leton,acting as a Education administrator for Maximino Greenland, MD.,have documented all relevant documentation on the behalf of Maximino Greenland, MD,as directed by  Maximino Greenland, MD while in the presence of Maximino Greenland, MD.   Subjective:     Patient ID: [name] , female    DOB: [birth year] , 76 y.o.   MRN: [id number]   Chief Complaint  Patient presents with   Annual Exam   Hypertension    HPI  Patient here for physical exam She is no longer followed by GYN. She reports compliance with meds. Denies headaches, chest pain and shortness of breath. She is not willing to weigh until she reaches her goal weight. She hopes to reach this by her next visit.   Hypertension This is a chronic problem. The current episode started more than 1 year ago. The problem has been gradually improving since onset. The problem is uncontrolled. Pertinent negatives include no blurred vision, chest pain, palpitations or shortness of breath. The current treatment provides moderate improvement.     Past Medical History:  Diagnosis Date   Back pain    Falls    Fibromyalgia    Hypertension    Joint pain    Joint stiffness    Joint swelling    Pure hypercholesterolemia 01/11/2020   Raynaud's disease    age 77's   Sleep apnea    Varicose veins of both lower extremities    Vitamin B12 deficiency    2020   Vitamin D deficiency      Family History  Problem Relation Age of Onset   Hypertension Mother    Hyperthyroidism Mother    Hypertension Father    Prostate cancer Father    Diabetes Brother    Breast cancer Paternal Aunt    Breast cancer Paternal Grandmother    Peripheral vascular disease Other      Current Outpatient Medications:    acetaminophen (TYLENOL) 500 MG tablet, Take 1,000 mg by mouth daily as needed for moderate pain., Disp: , Rfl:    atorvastatin (LIPITOR) 10 MG tablet, TAKE 1 TABLET(10 MG) BY MOUTH DAILY, Disp: 90 tablet, Rfl: 1   Black Pepper-Turmeric (TURMERIC  COMPLEX/BLACK PEPPER PO), Take 1 tablet by mouth daily., Disp: , Rfl:    diclofenac (VOLTAREN) 75 MG EC tablet, Take 1 tablet (75 mg total) by mouth 2 (two) times daily., Disp: 50 tablet, Rfl: 2   Doxylamine Succinate, Sleep, (SLEEP AID PO), Take 1 tablet by mouth daily. Takes half a tab at bedtime, Disp: , Rfl:    hydrochlorothiazide (HYDRODIURIL) 25 MG tablet, One tab po qd, Disp: 90 tablet, Rfl: 1   lisinopril (ZESTRIL) 10 MG tablet, Take 1 tablet (10 mg total) by mouth daily., Disp: 90 tablet, Rfl: 1   Melatonin 1 MG CAPS, Take 1 capsule by mouth daily., Disp: , Rfl:    Multiple Vitamin (MULTIVITAMIN) capsule, Take 1 capsule by mouth daily., Disp: , Rfl:    Allergies  Allergen Reactions   Penicillins     Does not remember      The patient states she uses post menopausal status for birth control. Last LMP was No LMP recorded. Patient is postmenopausal.. Negative for Dysmenorrhea. Negative for: breast discharge, breast lump(s), breast pain and breast self exam. Associated symptoms include abnormal vaginal bleeding. Pertinent negatives include abnormal bleeding (hematology), anxiety, decreased libido, depression, difficulty falling sleep, dyspareunia, history of infertility, nocturia, sexual dysfunction, sleep disturbances, urinary incontinence, urinary urgency, vaginal discharge and vaginal  itching. Diet regular.The patient states her exercise level is  intermittent.  . The patient's tobacco use is:  Social History   Tobacco Use  Smoking Status Former  Smokeless Tobacco Never  Tobacco Comments   quit at age 41,only smoked for 1-2 years  . She has been exposed to passive smoke. The patient's alcohol use is:  Social History   Substance and Sexual Activity  Alcohol Use Yes   Comment: 2 drinks weekly    Review of Systems  Constitutional: Negative.   HENT: Negative.    Eyes: Negative.  Negative for blurred vision.  Respiratory: Negative.  Negative for shortness of breath.    Cardiovascular: Negative.  Negative for chest pain and palpitations.       She reports she was advised by her insurance NP that she had decreased circulation in her lower extremities. She does admit to occasional calf pain with ambulation.   Endocrine: Negative.   Genitourinary: Negative.        She c/o vaginal bleeding. She reports seeing blood on toilet tissue after wiping. She does not think there was blood in her urine. This did not occur after sexual activity. There was no associated abdominal cramping.   Musculoskeletal: Negative.   Skin: Negative.   Allergic/Immunologic: Negative.   Neurological: Negative.   Hematological: Negative.   Psychiatric/Behavioral: Negative.       Today's Vitals   08/20/22 1012  BP: 122/80  Pulse: 65  Temp: 98.2 F (36.8 C)  PainSc: 0-No pain   There is no height or weight on file to calculate BMI.   Objective:  Physical Exam Vitals and nursing note reviewed.  Constitutional:      Appearance: Normal appearance.  HENT:     Head: Normocephalic and atraumatic.     Right Ear: Tympanic membrane, ear canal and external ear normal.     Left Ear: Tympanic membrane, ear canal and external ear normal.     Nose:     Comments: Masked     Mouth/Throat:     Comments: Masked Eyes:     Extraocular Movements: Extraocular movements intact.     Conjunctiva/sclera: Conjunctivae normal.     Pupils: Pupils are equal, round, and reactive to light.  Cardiovascular:     Rate and Rhythm: Normal rate and regular rhythm.     Pulses: Normal pulses.     Heart sounds: Normal heart sounds.  Pulmonary:     Effort: Pulmonary effort is normal.     Breath sounds: Normal breath sounds.  Chest:  Breasts:    Tanner Score is 5.     Right: Normal.     Left: Normal.  Abdominal:     General: Bowel sounds are normal.     Palpations: Abdomen is soft.  Genitourinary:    Comments: deferred Musculoskeletal:        General: Normal range of motion.     Cervical back:  Normal range of motion and neck supple.  Skin:    General: Skin is warm and dry.  Neurological:     General: No focal deficit present.     Mental Status: She is alert and oriented to person, place, and time.  Psychiatric:        Mood and Affect: Mood normal.        Behavior: Behavior normal.      Assessment And Plan:     1. Encounter for general adult medical examination w/o abnormal findings Comments: A full exam was performed. Importance  of monthly self breast exams was discussed with the patient. PATIENT IS ADVISED TO GET 30-45 MINUTES REGULAR EXERCISE NO LESS THAN FOUR TO FIVE DAYS PER WEEK - BOTH WEIGHTBEARING EXERCISES AND AEROBIC ARE RECOMMENDED.  PATIENT IS ADVISED TO FOLLOW A HEALTHY DIET WITH AT LEAST SIX FRUITS/VEGGIES PER DAY, DECREASE INTAKE OF RED MEAT, AND TO INCREASE FISH INTAKE TO TWO DAYS PER WEEK.  MEATS/FISH SHOULD NOT BE FRIED, BAKED OR BROILED IS PREFERABLE.  IT IS ALSO IMPORTANT TO CUT BACK ON YOUR SUGAR INTAKE. PLEASE AVOID ANYTHING WITH ADDED SUGAR, CORN SYRUP OR OTHER SWEETENERS. IF YOU MUST USE A SWEETENER, YOU CAN TRY STEVIA. IT IS ALSO IMPORTANT TO AVOID ARTIFICIALLY SWEETENERS AND DIET BEVERAGES. LASTLY, I SUGGEST WEARING SPF 50 SUNSCREEN ON EXPOSED PARTS AND ESPECIALLY WHEN IN THE DIRECT SUNLIGHT FOR AN EXTENDED PERIOD OF TIME.  PLEASE AVOID FAST FOOD RESTAURANTS AND INCREASE YOUR WATER INTAKE.  2. Parenchymal renal hypertension, stage 1 through stage 4 or unspecified chronic kidney disease Comments: Chronic, well controlled. EKG Performed, SB w/ LA and HR 57. She will c/w lisinopril 78m daily. She will c/w low sodium diet and f/u in 6 months.  - EKG 12-Lead - POCT Urinalysis Dipstick (81002) - Microalbumin / Creatinine Urine Ratio - CBC - CMP14+EGFR - Lipid panel  3. Stage 3a chronic kidney disease (HCC) Comments: Chronic, she is reminded to avoid NSAIDS, stay hydrated and keep BP controlled to decrease risk of CKD progression.   4. Postmenopausal  bleeding Comments: She agrees to GYN  eval. I will also refer her for pelvic/vaginal ultrasound.  - [place] Complete With Transvaginal; Future - Ambulatory referral to Gynecology  5. Pure hypercholesterolemia Comments: Chronic, she is currently on atorvastatin 164mdaily.   6. Claudication of lower extremity ([place]- VAS [place] WITH/WO TBI; Future  7. Immunization due - Flu Vaccine QUAD High Dose(Fluad)  Patient was given opportunity to ask questions. Patient verbalized understanding of the plan and was able to repeat key elements of the plan. All questions were answered to their satisfaction.   I, RoMaximino GreenlandMD, have reviewed all documentation for this visit. The documentation on 08/20/22 for the exam, diagnosis, procedures, and orders are all accurate and complete.   THE PATIENT IS ENCOURAGED TO PRACTICE SOCIAL DISTANCING DUE TO THE COVID-19 PANDEMIC.

## 2022-08-21 LAB — LIPID PANEL
Chol/HDL Ratio: 2.7 ratio (ref 0.0–4.4)
Cholesterol, Total: 167 mg/dL (ref 100–199)
HDL: 63 mg/dL (ref >39)
LDL Chol Calc (NIH): 91 mg/dL (ref 0–99)
Triglycerides: 66 mg/dL (ref 0–149)
VLDL Cholesterol Cal: 13 mg/dL (ref 5–40)

## 2022-08-21 LAB — MICROALBUMIN / CREATININE URINE RATIO
Creatinine, Urine: 104 mg/dL
Microalb/Creat Ratio: 4 mg/g creat (ref 0–29)
Microalbumin, Urine: 4.1 ug/mL

## 2022-08-21 LAB — CMP14+EGFR
ALT: 21 IU/L (ref 0–32)
AST: 24 IU/L (ref 0–40)
Albumin/Globulin Ratio: 1.4 (ref 1.2–2.2)
Albumin: 4.1 g/dL (ref 3.8–4.8)
Alkaline Phosphatase: 75 IU/L (ref 44–121)
BUN/Creatinine Ratio: 15 (ref 12–28)
BUN: 15 mg/dL (ref 8–27)
Bilirubin Total: 0.4 mg/dL (ref 0.0–1.2)
CO2: 24 mmol/L (ref 20–29)
Calcium: 9.3 mg/dL (ref 8.7–10.3)
Chloride: 104 mmol/L (ref 96–106)
Creatinine, Ser: 0.98 mg/dL (ref 0.57–1.00)
Globulin, Total: 3 g/dL (ref 1.5–4.5)
Glucose: 85 mg/dL (ref 70–99)
Potassium: 4.1 mmol/L (ref 3.5–5.2)
Sodium: 142 mmol/L (ref 134–144)
Total Protein: 7.1 g/dL (ref 6.0–8.5)
eGFR: 60 mL/min/{1.73_m2} (ref >59)

## 2022-08-21 LAB — CBC
Hematocrit: 36.4 % (ref 34.0–46.6)
Hemoglobin: 12.1 g/dL (ref 11.1–15.9)
MCH: 31.1 pg (ref 26.6–33.0)
MCHC: 33.2 g/dL (ref 31.5–35.7)
MCV: 94 fL (ref 79–97)
Platelets: 245 10*3/uL (ref 150–450)
RBC: 3.89 x10E6/uL (ref 3.77–5.28)
RDW: 12.6 % (ref 11.7–15.4)
WBC: 4.2 10*3/uL (ref 3.4–10.8)

## 2022-08-26 ENCOUNTER — Ambulatory Visit (HOSPITAL_COMMUNITY)
Admission: RE | Admit: 2022-08-26 | Payer: Medicare HMO | Source: Ambulatory Visit | Attending: Internal Medicine | Admitting: Internal Medicine

## 2022-08-26 ENCOUNTER — Ambulatory Visit
Admission: RE | Admit: 2022-08-26 | Discharge: 2022-08-26 | Disposition: A | Discharge status: Home / Self Care | Payer: Medicare HMO | Source: Ambulatory Visit | Attending: Internal Medicine | Admitting: Internal Medicine

## 2022-08-26 DIAGNOSIS — D251 Intramural leiomyoma of uterus: Secondary | ICD-10-CM | POA: Diagnosis not present

## 2022-08-26 DIAGNOSIS — N950 Postmenopausal bleeding: Secondary | ICD-10-CM

## 2022-08-28 ENCOUNTER — Encounter: Payer: Self-pay | Admitting: Internal Medicine

## 2022-09-04 ENCOUNTER — Ambulatory Visit (HOSPITAL_COMMUNITY)
Admission: RE | Admit: 2022-09-04 | Payer: Medicare HMO | Source: Ambulatory Visit | Attending: Internal Medicine | Admitting: Internal Medicine

## 2022-09-04 DIAGNOSIS — Z01419 Encounter for gynecological examination (general) (routine) without abnormal findings: Secondary | ICD-10-CM | POA: Diagnosis not present

## 2022-09-04 DIAGNOSIS — Z124 Encounter for screening for malignant neoplasm of cervix: Secondary | ICD-10-CM | POA: Diagnosis not present

## 2022-09-04 DIAGNOSIS — Z113 Encounter for screening for infections with a predominantly sexual mode of transmission: Secondary | ICD-10-CM | POA: Diagnosis not present

## 2022-09-04 DIAGNOSIS — R69 Illness, unspecified: Secondary | ICD-10-CM | POA: Diagnosis not present

## 2022-09-04 DIAGNOSIS — N950 Postmenopausal bleeding: Secondary | ICD-10-CM | POA: Diagnosis not present

## 2022-09-20 ENCOUNTER — Other Ambulatory Visit: Payer: Self-pay | Admitting: Obstetrics and Gynecology

## 2022-09-20 DIAGNOSIS — N950 Postmenopausal bleeding: Secondary | ICD-10-CM | POA: Diagnosis not present

## 2022-09-20 DIAGNOSIS — Z9889 Other specified postprocedural states: Secondary | ICD-10-CM | POA: Diagnosis not present

## 2022-09-20 DIAGNOSIS — Z1231 Encounter for screening mammogram for malignant neoplasm of breast: Secondary | ICD-10-CM | POA: Diagnosis not present

## 2022-09-24 ENCOUNTER — Other Ambulatory Visit: Payer: Self-pay | Admitting: Podiatry

## 2022-10-01 ENCOUNTER — Other Ambulatory Visit: Payer: Self-pay

## 2022-10-01 DIAGNOSIS — E7800 Pure hypercholesterolemia, unspecified: Secondary | ICD-10-CM

## 2022-10-01 DIAGNOSIS — I129 Hypertensive chronic kidney disease with stage 1 through stage 4 chronic kidney disease, or unspecified chronic kidney disease: Secondary | ICD-10-CM

## 2022-10-01 MED ORDER — ATORVASTATIN CALCIUM 10 MG PO TABS: ORAL_TABLET | ORAL | 1 refills | Status: DC

## 2022-10-01 MED ORDER — LISINOPRIL 10 MG PO TABS: 10.0000 mg | ORAL_TABLET | Freq: Every day | ORAL | 1 refills | Status: DC

## 2022-10-16 ENCOUNTER — Encounter: Payer: Self-pay | Admitting: Internal Medicine

## 2022-11-07 DIAGNOSIS — M797 Fibromyalgia: Secondary | ICD-10-CM | POA: Diagnosis not present

## 2022-11-07 DIAGNOSIS — I129 Hypertensive chronic kidney disease with stage 1 through stage 4 chronic kidney disease, or unspecified chronic kidney disease: Secondary | ICD-10-CM | POA: Diagnosis not present

## 2022-11-07 DIAGNOSIS — N182 Chronic kidney disease, stage 2 (mild): Secondary | ICD-10-CM | POA: Diagnosis not present

## 2022-11-07 DIAGNOSIS — E785 Hyperlipidemia, unspecified: Secondary | ICD-10-CM | POA: Diagnosis not present

## 2022-11-07 DIAGNOSIS — E669 Obesity, unspecified: Secondary | ICD-10-CM | POA: Diagnosis not present

## 2022-11-19 DIAGNOSIS — R928 Other abnormal and inconclusive findings on diagnostic imaging of breast: Secondary | ICD-10-CM | POA: Diagnosis not present

## 2022-12-17 NOTE — Progress Notes (Unsigned)
I,Victoria T Hamilton,acting as a scribe for Maximino Greenland, MD.,have documented all relevant documentation on the behalf of Maximino Greenland, MD,as directed by  Maximino Greenland, MD while in the presence of Maximino Greenland, MD.      Subjective:     Patient ID: [name] , female    DOB: 1946/01/16 , 77 y.o.   MRN: [id number]   Chief Complaint  Patient presents with   Hand Pain    HPI  Pt presents today for R hand pain. She states it has been going on for 2 years. She states it has worsened recently. She is now having issues with her left hand. She has also noticed tingling and decreased grip strength on the right. She is right-handed. She has not tried any OTC meds for relief.  She does wear wrist brace on occasion.      Past Medical History:  Diagnosis Date   Back pain    Falls    Fibromyalgia    Hypertension    Joint pain    Joint stiffness    Joint swelling    Pure hypercholesterolemia 01/11/2020   Raynaud's disease    age 33's   Sleep apnea    Varicose veins of both lower extremities    Vitamin B12 deficiency    2020   Vitamin D deficiency      Family History  Problem Relation Age of Onset   Hypertension Mother    Hyperthyroidism Mother    Hypertension Father    Prostate cancer Father    Diabetes Brother    Breast cancer Paternal Aunt    Breast cancer Paternal Grandmother    Peripheral vascular disease Other      Current Outpatient Medications:    acetaminophen (TYLENOL) 500 MG tablet, Take 1,000 mg by mouth daily as needed for moderate pain., Disp: , Rfl:    atorvastatin (LIPITOR) 10 MG tablet, TAKE 1 TABLET(10 MG) BY MOUTH DAILY, Disp: 90 tablet, Rfl: 1   Black Pepper-Turmeric (TURMERIC COMPLEX/BLACK PEPPER PO), Take 1 tablet by mouth daily., Disp: , Rfl:    diclofenac (VOLTAREN) 75 MG EC tablet, Take 1 tablet (75 mg total) by mouth 2 (two) times daily., Disp: 50 tablet, Rfl: 2   Doxylamine Succinate, Sleep, (SLEEP AID PO), Take 1 tablet by mouth  daily. Takes half a tab at bedtime, Disp: , Rfl:    hydrochlorothiazide (HYDRODIURIL) 25 MG tablet, One tab po qd, Disp: 90 tablet, Rfl: 1   lisinopril (ZESTRIL) 10 MG tablet, Take 1 tablet (10 mg total) by mouth daily., Disp: 90 tablet, Rfl: 1   Melatonin 1 MG CAPS, Take 1 capsule by mouth daily., Disp: , Rfl:    Multiple Vitamin (MULTIVITAMIN) capsule, Take 1 capsule by mouth daily., Disp: , Rfl:    Allergies  Allergen Reactions   Penicillins     Does not remember     Review of Systems  Constitutional: Negative.   Respiratory: Negative.    Cardiovascular: Negative.   Neurological:  Positive for numbness.  Psychiatric/Behavioral: Negative.       Today's Vitals   12/18/22 0939 12/18/22 0953  BP: (!) 142/90 128/82  Pulse: 92   Temp: 98.1 F (36.7 C)   Height: '5\' 7"'$  (1.702 m)    Body mass index is 35.47 kg/m.  Wt Readings from Last 3 Encounters:  01/07/22 226 lb 8 oz (102.7 kg)  11/28/21 215 lb (97.5 kg)  05/29/20 240 lb (108.9 kg)    Objective:  Physical Exam Vitals and nursing note reviewed.  Constitutional:      Appearance: Normal appearance. She is obese.  HENT:     Head: Normocephalic and atraumatic.     Nose:     Comments: Masked     Mouth/Throat:     Comments: Masked  Eyes:     Extraocular Movements: Extraocular movements intact.  Cardiovascular:     Rate and Rhythm: Normal rate and regular rhythm.     Heart sounds: Normal heart sounds.  Pulmonary:     Effort: Pulmonary effort is normal.     Breath sounds: Normal breath sounds.  Musculoskeletal:     Comments: Pos Phalen's on R , neg Tinel's b/l Pos squeeze test on R  Skin:    General: Skin is warm.  Neurological:     General: No focal deficit present.     Mental Status: She is alert.  Psychiatric:        Mood and Affect: Mood normal.        Behavior: Behavior normal.      Assessment And Plan:     1. 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 May also have OA in [facility] joint. She  agrees to referral to hand specialist. - Ambulatory referral to Hand Surgery  2. Paresthesia and pain of both upper extremities Comments: She agrees to b/l NCS. I will forward results to Hand surgeon. - Ambulatory referral to Neurology - Ambulatory referral to Hand Surgery  3. Parenchymal renal hypertension, stage 1 through stage 4 or unspecified chronic kidney disease Comments: Chronic, fair control. Likely exacerbated by pain. No med changes today.  4. Stage 3a chronic kidney disease ([place]) Comments: Chronic, reminded to avoid NSAIDs.   Patient was given opportunity to ask questions. Patient verbalized understanding of the plan and was able to repeat key elements of the plan. All questions were answered to their satisfaction.   I, Maximino Greenland, MD, have reviewed all documentation for this visit. The documentation on 12/18/22 for the exam, diagnosis, procedures, and orders are all accurate and complete.   IF YOU HAVE BEEN REFERRED TO A SPECIALIST, IT MAY TAKE 1-2 WEEKS TO SCHEDULE/PROCESS THE REFERRAL. IF YOU HAVE NOT HEARD FROM US/SPECIALIST IN TWO WEEKS, PLEASE GIVE [place] A CALL AT [phone] X [phone].   THE PATIENT IS ENCOURAGED TO PRACTICE SOCIAL DISTANCING DUE TO THE COVID-19 PANDEMIC.

## 2022-12-18 ENCOUNTER — Ambulatory Visit (INDEPENDENT_AMBULATORY_CARE_PROVIDER_SITE_OTHER): Payer: Medicare HMO | Admitting: Internal Medicine

## 2022-12-18 ENCOUNTER — Encounter: Payer: Self-pay | Admitting: Internal Medicine

## 2022-12-18 VITALS — BP 128/82 | HR 92 | Temp 98.1°F | Ht 67.0 in

## 2022-12-18 DIAGNOSIS — N1831 Chronic kidney disease, stage 3a: Secondary | ICD-10-CM | POA: Diagnosis not present

## 2022-12-18 DIAGNOSIS — M79602 Pain in left arm: Secondary | ICD-10-CM

## 2022-12-18 DIAGNOSIS — I129 Hypertensive chronic kidney disease with stage 1 through stage 4 chronic kidney disease, or unspecified chronic kidney disease: Secondary | ICD-10-CM

## 2022-12-18 DIAGNOSIS — M79601 Pain in right arm: Secondary | ICD-10-CM | POA: Diagnosis not present

## 2022-12-18 DIAGNOSIS — M79641 Pain in right hand: Secondary | ICD-10-CM

## 2022-12-18 DIAGNOSIS — R202 Paresthesia of skin: Secondary | ICD-10-CM

## 2022-12-18 DIAGNOSIS — M79643 Pain in unspecified hand: Secondary | ICD-10-CM

## 2022-12-27 ENCOUNTER — Ambulatory Visit: Payer: Medicare HMO | Admitting: Podiatry

## 2022-12-27 ENCOUNTER — Encounter: Payer: Self-pay | Admitting: Podiatry

## 2022-12-27 ENCOUNTER — Ambulatory Visit (INDEPENDENT_AMBULATORY_CARE_PROVIDER_SITE_OTHER): Payer: Medicare HMO

## 2022-12-27 DIAGNOSIS — M7661 Achilles tendinitis, right leg: Secondary | ICD-10-CM

## 2022-12-27 MED ORDER — PREDNISONE 10 MG PO TABS: ORAL_TABLET | ORAL | 0 refills | Status: DC

## 2022-12-27 MED ORDER — TRIAMCINOLONE ACETONIDE 10 MG/ML IJ SUSP
10.0000 mg | Freq: Once | INTRAMUSCULAR | Status: AC
  Administered 2022-12-27: 10 mg

## 2022-12-27 NOTE — Progress Notes (Signed)
Subjective:   Patient ID: [name], female   DOB: 77 y.o.   MRN: [id number]   HPI Patient presents stating that her heel has still been hurting her and has gotten worse again recently.  Admits she did not wear the boot to a significant nature but did go to [place] and is due to go to the [place]  Patient states the pain has become worse it is hard to wear closed in shoes neuro   ROS      Objective:  Physical Exam  Vascular status intact no indication of muscle strength loss with inflammation pain Achilles tendon insertion lateral side mild medial and central.  Patient has good digital perfusion well-oriented x 3     Assessment:  Acute Achilles right lateral side with inflammation fluid buildup     Plan:  H&P reviewed again discussed the importance of immobilization and she is getting go back to complete immobilization.  It has been 6 months I did 1 careful steroid injection after explaining the chances for rupture associated with that and patient tolerated that well to the lateral side of the calcaneal Achilles insertion stayed away central medial.  Did discuss shockwave therapy which I do think may be very helpful for her but I would like to get this inflammation down a little bit first  X-rays indicate that there is spur formation that is not increased in size over the last 6 months

## 2023-01-09 ENCOUNTER — Ambulatory Visit: Payer: Medicare HMO | Admitting: Podiatry

## 2023-01-09 ENCOUNTER — Encounter: Payer: Self-pay | Admitting: Podiatry

## 2023-01-09 DIAGNOSIS — M79671 Pain in right foot: Secondary | ICD-10-CM | POA: Diagnosis not present

## 2023-01-09 DIAGNOSIS — M79672 Pain in left foot: Secondary | ICD-10-CM | POA: Diagnosis not present

## 2023-01-09 DIAGNOSIS — M7661 Achilles tendinitis, right leg: Secondary | ICD-10-CM | POA: Diagnosis not present

## 2023-01-09 NOTE — Progress Notes (Signed)
Subjective:   Patient ID: [name], female   DOB: 77 y.o.   MRN: [id number]   HPI Patient states doing much better mild discomfort getting ready to go on vacation   ROS      Objective:  Physical Exam  Neurovascular status intact significant reduction of inflammation posterior heel right and reduction of foot pain.  Patient still has mild discomfort minimal swelling     Assessment:  Achilles tendinitis right with inflammation fluid buildup along with mild foot pain     Plan:  Reviewed Achilles tendinitis reviewed stretching exercises shoes with heels and boot usage as needed along with ice.  Discussed possible shockwave therapy PRP injection or possible surgery if symptoms were to indicate.  All questions answered today

## 2023-01-21 DIAGNOSIS — G629 Polyneuropathy, unspecified: Secondary | ICD-10-CM | POA: Diagnosis not present

## 2023-01-21 DIAGNOSIS — G5603 Carpal tunnel syndrome, bilateral upper limbs: Secondary | ICD-10-CM | POA: Diagnosis not present

## 2023-02-03 DIAGNOSIS — M13841 Other specified arthritis, right hand: Secondary | ICD-10-CM | POA: Diagnosis not present

## 2023-02-03 DIAGNOSIS — R52 Pain, unspecified: Secondary | ICD-10-CM | POA: Diagnosis not present

## 2023-02-03 DIAGNOSIS — G5603 Carpal tunnel syndrome, bilateral upper limbs: Secondary | ICD-10-CM | POA: Diagnosis not present

## 2023-02-21 DIAGNOSIS — M1811 Unilateral primary osteoarthritis of first carpometacarpal joint, right hand: Secondary | ICD-10-CM | POA: Diagnosis not present

## 2023-02-21 DIAGNOSIS — M19031 Primary osteoarthritis, right wrist: Secondary | ICD-10-CM | POA: Diagnosis not present

## 2023-02-21 DIAGNOSIS — G5601 Carpal tunnel syndrome, right upper limb: Secondary | ICD-10-CM | POA: Diagnosis not present

## 2023-02-27 ENCOUNTER — Emergency Department (HOSPITAL_COMMUNITY)
Admission: EM | Admit: 2023-02-27 | Discharge: 2023-02-27 | Disposition: A | Discharge status: Home / Self Care | Payer: Medicare HMO | Attending: Emergency Medicine | Admitting: Emergency Medicine

## 2023-02-27 ENCOUNTER — Emergency Department (HOSPITAL_COMMUNITY): Payer: Medicare HMO

## 2023-02-27 DIAGNOSIS — R079 Chest pain, unspecified: Secondary | ICD-10-CM

## 2023-02-27 DIAGNOSIS — I129 Hypertensive chronic kidney disease with stage 1 through stage 4 chronic kidney disease, or unspecified chronic kidney disease: Secondary | ICD-10-CM | POA: Insufficient documentation

## 2023-02-27 DIAGNOSIS — R0789 Other chest pain: Secondary | ICD-10-CM | POA: Diagnosis not present

## 2023-02-27 DIAGNOSIS — I10 Essential (primary) hypertension: Secondary | ICD-10-CM

## 2023-02-27 DIAGNOSIS — N189 Chronic kidney disease, unspecified: Secondary | ICD-10-CM | POA: Insufficient documentation

## 2023-02-27 DIAGNOSIS — Z743 Need for continuous supervision: Secondary | ICD-10-CM | POA: Diagnosis not present

## 2023-02-27 DIAGNOSIS — Z0389 Encounter for observation for other suspected diseases and conditions ruled out: Secondary | ICD-10-CM | POA: Diagnosis not present

## 2023-02-27 LAB — CBC
HCT: 39.1 % (ref 36.0–46.0)
Hemoglobin: 12.9 g/dL (ref 12.0–15.0)
MCH: 31.5 pg (ref 26.0–34.0)
MCHC: 33 g/dL (ref 30.0–36.0)
MCV: 95.4 fL (ref 80.0–100.0)
Platelets: 297 10*3/uL (ref 150–400)
RBC: 4.1 MIL/uL (ref 3.87–5.11)
RDW: 13.3 % (ref 11.5–15.5)
WBC: 5.1 10*3/uL (ref 4.0–10.5)
nRBC: 0 % (ref 0.0–0.2)

## 2023-02-27 LAB — TROPONIN I (HIGH SENSITIVITY)
Troponin I (High Sensitivity): 4 ng/L (ref <18)
Troponin I (High Sensitivity): 3 ng/L (ref <18)

## 2023-02-27 LAB — BASIC METABOLIC PANEL
Anion gap: 9 (ref 5–15)
BUN: 21 mg/dL (ref 8–23)
CO2: 25 mmol/L (ref 22–32)
Calcium: 9.5 mg/dL (ref 8.9–10.3)
Chloride: 104 mmol/L (ref 98–111)
Creatinine, Ser: 0.91 mg/dL (ref 0.44–1.00)
GFR, Estimated: >60 mL/min (ref >60)
Glucose, Bld: 88 mg/dL (ref 70–99)
Potassium: 4.6 mmol/L (ref 3.5–5.1)
Sodium: 138 mmol/L (ref 135–145)

## 2023-02-27 LAB — D-DIMER, QUANTITATIVE: D-Dimer, Quant: 1.76 ug/mL-FEU — ABNORMAL HIGH (ref 0.00–0.50)

## 2023-02-27 MED ORDER — IOHEXOL 350 MG/ML SOLN
65.0000 mL | Freq: Once | INTRAVENOUS | Status: AC | PRN
  Administered 2023-02-27: 65 mL via INTRAVENOUS

## 2023-02-27 NOTE — ED Provider Notes (Signed)
I saw and evaluated the patient, reviewed the resident's note and I agree with the findings and plan.  EKG Interpretation  Date/Time:  Thursday February 27 2023 14:43:46 EDT Ventricular Rate:  67 PR Interval:  122 QRS Duration: 105 QT Interval:  390 QTC Calculation: 412 R Axis:   -26 Text Interpretation: Sinus rhythm Borderline left axis deviation No significant change since last tracing Confirmed by Lorre Nick ([id number]) on 02/27/2023 3:39:51 PM   77 year old female presents with sudden onset of chest pain was last for approximately 10 minutes.  Recent history of surgery.  Notes increased left leg edema.  Her EKG per interpretation shows no signs of acute coronary ischemia.  Normal sinus rhythm.  Moderate suspicion for possible PE and possible ACS.  Patient have CT angio chest for PE.  Will cycle cardiac enzymes and reassess   Lorre Nick, MD 02/27/23 [phone]

## 2023-02-27 NOTE — Discharge Instructions (Addendum)
[name]  Thank you for allowing [place] to take care of you today.  You came to the Emergency Department today because had an episode of chest pain today.  We did blood work including 2 troponins (a protein from your heart), these were normal twice which indicates that you are not having any decreased blood flow or ischemia to your heart currently.  We also did a blood clot risk number which was elevated, however we did a CT of your chest which did not show any blood clots in your lung.  We did not see any evidence of pneumonia, fluid collection around your lung, air around your lungs which not be, or any other emergent abnormality.  It is unclear what exactly caused your chest pain, but it is likely not to be caused by an emergency cause.  We are going to refer you back to cardiology so they can determine whether or not you need to have a repeat stress test or any other workup of your heart given you do have risk factors such as high cholesterol and high blood pressure.   To-Do: 1. Please follow-up with your primary doctor within 2 days / as soon as possible.   Please return to the Emergency Department or call 911 if you experience have worsening of your symptoms, or do not get better, new or different chest pain, shortness of breath, severe or significantly worsening pain, high fever, severe confusion, pass out or have any reason to think that you need emergency medical care.   We hope you feel better soon.   Curley Spice, MD Department of Emergency Medicine [facility]

## 2023-02-27 NOTE — ED Provider Notes (Addendum)
[place] EMERGENCY DEPARTMENT AT [facility] Provider Note  Medical Decision Making   HPI: [name] is a 77 y.o. female with history perinent for varicose veins, OSA, obesity, HTN, HCL, CKD who presents complaining of chest pain. Patient arrived via POV.  History provided by patient.  No interpreter required for this encounter.  Patient reports that approximately 1 hour prior to arrival she was walking when she experienced 10 to 15 minutes of chest pain while walking.  Reports that the chest pain resolved spontaneously.  Denies any sharp pain or chest pressure.  States that it felt "weird" and moved in a counterclockwise circle around her chest.  She did not have any associated nausea, diaphoresis, shortness of breath.  She denies prior history of CAD, states that she has previously had a stress test that was negative.  Reports that she has intermittently had dizziness over the past several days, no change in vision, headache.  She has not been any chest pain currently.  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  Expresses concern that her symptoms could be related to her post-operative oxycodone which she has weaned herself off of.  ROS: As per HPI. Please see MAR for complete past medical history, surgical history, and social history.   Physical exam is pertinent for right upper extremity in postop splint, otherwise no focal findings.   The differential includes but is not limited to ACS, arrhythmia, pericardial tamponade, pericarditis, myocarditis, pneumonia, pneumothorax, esophageal, tear, perforated abdominal viscous, pulmonary embolism, aortic dissection, costochondritis, musculoskeletal chest wall pain, GERD.  [name]  Additional history obtained  from: None External records from outside source obtained and reviewed including: None  ED provider interpretation of ECG: Rate 67, sinus rhythm, biphasic T wave in lead V1, no ST elevations or depressions or other T wave inversions  ED provider interpretation of radiology/imaging: CXR without focal airspace opacification, cardiomediastinal silhouette derangement, pleural effusion, pneumothorax, bony displacement.  Patient reviewed patient's CT PE study, I do not appreciate potential PE, or focal parenchymal infiltrates.  Labs ordered were interpreted by myself as well as my attending and were incorporated into the medical decision making process for this patient.  ED provider interpretation of labs: Troponin 4, delta troponin 3, D-dimer is elevated at 1.76.  CBC without leukocytosis, anemia, thrombocytopenia.  BMP without AKI or emergent electrolyte derangement.  Interventions: None  See the EMR for full details regarding lab and imaging results.  [name] is awake, alert, and HDS.  Her exam is most notable for splint to right forearm, no other focal findings  The ECG reveals no anatomical ischemia representing STEMI, New-Onset Arrhythmia, or ischemic equivalent.  She has been risk stratified with a HEAR score of 3. Initial troponin is 4; delta troponin is 3.  The patient's presentation, the patient being hemodynamically stable, and the ECG are not consistent with Pericardial Tamponade. The patient's pain is not positional. This in conjunction with the lack of PR depressions and ST elevations on the ECG are reassuring against Pericarditis. The patient's non-elevated troponin and ECG are also inconsistent with Myocarditis.  The CXR is unremarkable for focal airspace disease.  The patient is afebrile and denies productive cough.  Therefore, I do not suspect Pneumonia. There is no evidence  of Pneumothorax on physical exam or on the CXR. CXR shows no evidence of Esophageal Tear and there is no  recent intractable emesis or esophageal instrumentation. There is no peritonitis or free air on CXR worrisome for a Perforated Abdominal Viscous.  Pulmonary Embolism is on the differential. The patient is at  risk via the Revised Geneva Criteria. Therefore, we will further risk stratify the patient with a d-dimer.  This was elevated. Therefore, a CTA was performed and negative for PE.  The patient's pain is not tearing and it does not radiate to back. Pulses are present bilaterally in both the upper and lower extremities. CXR does not show a widened mediastinum. I have a very low suspicion for Aortic Dissection.  Overall, unclear etiology of patient's chest pain, however low risk for emergent etiology given above workup, discussed this with patient at bedside including lack of diagnostic clarity, however low index of suspicion for emergent pathology given workup.  Was referred to cardiology given elevated hear score d/t cardiac risk factors.  Additionally discussed elevated blood pressures in the ED and recommended follow-up with PCP for further titration of blood pressure medication.  Patient expresses understanding, discharged in stable condition.  Consults: Not indicated  Disposition: DISCHARGE: I believe that the patient is safe for discharge home with outpatient follow-up. Patient was informed of all pertinent physical exam, laboratory, and imaging findings. Patient's suspected etiology of their symptom presentation was discussed with the patient and all questions were answered. We discussed following up with PCP. I provided thorough ED return precautions. The patient feels safe and comfortable with this plan.  The plan for this patient was discussed with Dr. Freida Busman, who voiced agreement and who oversaw evaluation and treatment of this patient.  Clinical Impression:  1. Chest pain with low risk for cardiac etiology   2. Essential hypertension    Discharge  Therapies: These medications and  interventions were provided for the patient while in the ED. Medications  iohexol (OMNIPAQUE) 350 MG/ML injection 65 mL (65 mLs Intravenous Contrast Given 02/27/23 1735)    MDM generated using voice dictation software and may contain dictation errors.  Please contact me for any clarification or with any questions.  Clinical Complexity A medically appropriate history, review of systems, and physical exam was performed.  Collateral history obtained from: None I personally reviewed the labs, EKG, imaging as discussed above. Patient's presentation is most consistent with acute complicated illness / injury requiring diagnostic workup Considered and ruled out life and body threatening conditions  Treatment: Outpatient referral Medications: None Discussed patient's care with providers from the following different specialties: None    Physical Exam   ED Triage Vitals  Enc Vitals Group     BP 02/27/23 1450 (!) 158/77     Pulse Rate 02/27/23 1445 71     Resp 02/27/23 1445 15     Temp 02/27/23 1450 97.8 F (36.6 C)     Temp Source 02/27/23 1450 Oral     SpO2 02/27/23 1445 96 %     Weight --      Height --      Head Circumference --      Peak Flow --      Pain Score 02/27/23 1456 0     Pain Loc --      Pain Edu? --      Excl. in GC? --      Physical Exam Vitals and nursing note reviewed.  Constitutional:      General:  She is not in acute distress.    Appearance: She is well-developed.  HENT:     Head: Normocephalic and atraumatic.  Eyes:     Conjunctiva/sclera: Conjunctivae normal.  Cardiovascular:     Rate and Rhythm: Normal rate and regular rhythm.     Heart sounds: No murmur heard. Pulmonary:     Effort: Pulmonary effort is normal. No respiratory distress.     Breath sounds: Normal breath sounds.  Abdominal:     Palpations: Abdomen is soft.     Tenderness: There is no abdominal tenderness.  Musculoskeletal:        General: No swelling.     Cervical back: Neck supple.      Comments: Splint to right upper extremity  Skin:    General: Skin is warm and dry.     Capillary Refill: Capillary refill takes less than 2 seconds.  Neurological:     General: No focal deficit present.     Mental Status: She is alert and oriented to person, place, and time.  Psychiatric:        Mood and Affect: Mood normal.       Procedure Note  Procedures  CT Angio Chest PE W and/or Wo Contrast  Final Result    DG Chest Port 1 View  Final Result      Julianne RiceL. S. Mekhi Sonn, MD Emergency Medicine, PGY-2      Curley SpiceStanek, Uno Esau, MD 02/27/23 Izell [id number]    Curley SpiceStanek, Dalton Molesworth, MD 02/27/23 1911    Lorre NickAllen, Anthony, MD 03/02/23 [phone]

## 2023-02-27 NOTE — ED Triage Notes (Signed)
Pt BIB [facility] for Chest tightness across her chest.  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  Pt had hand surgery 6 days ago. Advised to come here for clot rule out.  Pt is A&O time 4. No cardiac hx but does take a statin. Hx of stress test.   180/120 on scene, 134/82 on arrival.  HR 78

## 2023-03-04 ENCOUNTER — Telehealth: Payer: Self-pay | Admitting: *Deleted

## 2023-03-04 NOTE — Telephone Encounter (Signed)
        Patient  visited [facility] on 02/27/2023  for treatment   Telephone encounter attempt :  1st  A HIPAA compliant voice message was left requesting a return call.  Instructed patient to call back at [phone].  Jaxden Blyden Greenauer -Mor[facility] [place], Population Health [phone] [address] , [place] [address] Email : [email or web address]       

## 2023-03-06 DIAGNOSIS — Z4789 Encounter for other orthopedic aftercare: Secondary | ICD-10-CM | POA: Diagnosis not present

## 2023-03-06 DIAGNOSIS — M13841 Other specified arthritis, right hand: Secondary | ICD-10-CM | POA: Diagnosis not present

## 2023-03-14 ENCOUNTER — Ambulatory Visit (HOSPITAL_BASED_OUTPATIENT_CLINIC_OR_DEPARTMENT_OTHER): Payer: Medicare HMO | Admitting: Family

## 2023-03-18 ENCOUNTER — Ambulatory Visit (HOSPITAL_BASED_OUTPATIENT_CLINIC_OR_DEPARTMENT_OTHER): Payer: Medicare HMO | Admitting: Family

## 2023-03-18 ENCOUNTER — Encounter (HOSPITAL_BASED_OUTPATIENT_CLINIC_OR_DEPARTMENT_OTHER): Payer: Self-pay | Admitting: Family

## 2023-03-18 VITALS — BP 132/78 | HR 71 | Ht 67.0 in

## 2023-03-18 DIAGNOSIS — I10 Essential (primary) hypertension: Secondary | ICD-10-CM

## 2023-03-18 DIAGNOSIS — E782 Mixed hyperlipidemia: Secondary | ICD-10-CM | POA: Diagnosis not present

## 2023-03-18 DIAGNOSIS — R079 Chest pain, unspecified: Secondary | ICD-10-CM

## 2023-03-18 NOTE — Progress Notes (Signed)
Office Visit    Patient Name: [name] Date of Encounter: 03/18/2023  PCP:  Dorothyann Peng, MD   [facility]  Cardiologist:  Chilton Si, MD  Advanced Practice Provider:  No care team member to display Electrophysiologist:  None      Chief Complaint    [name] is a 77 y.o. female presents today for follow up after ED visit   Past Medical History    Past Medical History:  Diagnosis Date   Back pain    Falls    Fibromyalgia    Hypertension    Joint pain    Joint stiffness    Joint swelling    Pure hypercholesterolemia 01/11/2020   Raynaud's disease    age 52's   Sleep apnea    Varicose veins of both lower extremities    Vitamin B12 deficiency    2020   Vitamin D deficiency    Past Surgical History:  Procedure Laterality Date   BREAST BIOPSY Right    BREAST EXCISIONAL BIOPSY Right    FOOT FRACTURE SURGERY Right    FRACTURE SURGERY     KNEE SURGERY Left 2007   TUBAL LIGATION      Allergies  Allergies  Allergen Reactions   Penicillins     Does not remember    History of Present Illness    [name] is a 77 y.o. female with a hx of obesity, hypertension, hyperlipidemia, lower extremity edema last seen 03/28/20.  Last seen 03/28/20 BP elevated despite Lisinopril and HCTZ. She preferred to work on weight loss prior to adjusting medications.   ED visit 02/27/23 with chest pain while walkign that resolved spontaneously. D-dimer collected was elevated with subsequent CT with no PE. Troponin negative x 2.   Presents today for follow up independently. Initial episode described as tightness in her chest which was moving around her chest after walking her stairs at home. Associated with feeling very faint. Has not had recurrent chest discomfort. Reports some dyspnea with more than usual exertion particularly when walking 3 flights of stairs at home which she attributes to inactivity. This is overall stable. No  orthopnea, PND. Reports bilateral LE edema which is overall unchanged from previous L often more than R. Often worse in the evening. Known issues with bilateral knees limiting exercise tolerance. Follows predominantly vegan or pescetarian diet. Notes lightheadedness and dizziness. Occurs both standing and sitting. Encouraged to check BP during an episode. Discussed importance of regular meals.   EKGs/Labs/Other Studies Reviewed:   The following studies were reviewed today:      EKG:  EKG is not ordered today.  The ekg independently reviewed from 02/28/23 demonstrated NSR 67 bpm with no acute ST/T wave changes.   Recent Labs: 08/20/2022: ALT 21 02/27/2023: BUN 21; Creatinine, Ser 0.91; Hemoglobin 12.9; Platelets 297; Potassium 4.6; Sodium 138  Recent Lipid Panel    Component Value Date/Time   CHOL 167 08/20/2022 1122   TRIG 66 08/20/2022 1122   HDL 63 08/20/2022 1122   CHOLHDL 2.7 08/20/2022 1122   LDLCALC 91 08/20/2022 1122    Home Medications   Current Meds  Medication Sig   acetaminophen (TYLENOL) 500 MG tablet Take 1,000 mg by mouth daily as needed for moderate pain.   atorvastatin (LIPITOR) 10 MG tablet TAKE 1 TABLET(10 MG) BY MOUTH DAILY   Black Pepper-Turmeric (TURMERIC COMPLEX/BLACK PEPPER PO) Take 1 tablet by mouth daily.   diclofenac (VOLTAREN) 75 MG EC tablet Take 1  tablet (75 mg total) by mouth 2 (two) times daily.   Doxylamine Succinate, Sleep, (SLEEP AID PO) Take 1 tablet by mouth daily. Takes half a tab at bedtime   hydrochlorothiazide (HYDRODIURIL) 25 MG tablet One tab po qd   lisinopril (ZESTRIL) 10 MG tablet Take 1 tablet (10 mg total) by mouth daily.   Melatonin 1 MG CAPS Take 1 capsule by mouth daily.   Multiple Vitamin (MULTIVITAMIN) capsule Take 1 capsule by mouth daily.     Review of Systems      All other systems reviewed and are otherwise negative except as noted above.  Physical Exam    VS:  BP 134/74   Pulse 71   Ht 5\' 7"  (1.702 m)   BMI 35.47  kg/m  , BMI Body mass index is 35.47 kg/m.  Wt Readings from Last 3 Encounters:  01/07/22 226 lb 8 oz (102.7 kg)  11/28/21 215 lb (97.5 kg)  05/29/20 240 lb (108.9 kg)    GEN: Well nourished, overweight, well developed, in no acute distress. HEENT: normal. Neck: Supple, no JVD, carotid bruits, or masses. Cardiac: RRR, no murmurs, rubs, or gallops. No clubbing, cyanosis, edema.  Radials/PT 2+ and equal bilaterally.  Respiratory:  Respirations regular and unlabored, clear to auscultation bilaterally. GI: Soft, nontender, nondistended. MS: No deformity or atrophy. Skin: Warm and dry, no rash. Neuro:  Strength and sensation are intact. Psych: Normal affect.  Assessment & Plan    Chest pain in adult - Recent ED visit with unremarkable workup. Chest pain somewhat atypical as it moved around the chest. EKG in ED no acute ST/T wave changes. CT angio with no reported coronary calcification, no aortic calcification, no PE. No known family history of heart disease. No recurrent chest pain. Discussed possible cardiac CTA but as no recurrent chest pain we have agreed to monitor symptoms. If they recur, plan for ischemic evaluation.   HLD - 08/20/22 total cholesterol 167, triglycerides 66, HDL 63, LDL 91. LDL goal <100. Continue Atorvastatin 10mg  QD.  HTN - BP mildly elevated today 134/74 but well controlle din recent office visits. Discussed to monitor BP at home at least 2 hours after medications and sitting for 5-10 minutes. Continue HCTZ 25mg  QD, Lisinopril 10mg  QD. If BP persistently elevated in the future could increase Lisinopril.   BMI 35 - Weight loss via diet and exercise encouraged. Discussed the impact being overweight would have on cardiovascular risk. Refer to PREP exercise program.          Disposition: Follow up  in 6-8 months  with Chilton Si, MD or APP.  Signed, Alver Sorrow, NP 03/18/2023, 8:56 AM  [facility]

## 2023-03-18 NOTE — Patient Instructions (Addendum)
Medication Instructions:  Your physician recommends that you continue on your current medications as directed. Please refer to the Current Medication list given to you today.  Follow-Up: At [facility], you and your health needs are our priority.  As part of our continuing mission to provide you with exceptional heart care, we have created designated Provider Care Teams.  These Care Teams include your primary Cardiologist (physician) and Advanced Practice Providers (APPs -  Physician Assistants and Nurse Practitioners) who all work together to provide you with the care you need, when you need it.  We recommend signing up for the patient portal called "MyChart".  Sign up information is provided on this After Visit Summary.  MyChart is used to connect with patients for Virtual Visits (Telemedicine).  Patients are able to view lab/test results, encounter notes, upcoming appointments, etc.  Non-urgent messages can be sent to your provider as well.   To learn more about what you can do with MyChart, go to [email or web address].    Your next appointment:   6-8 month(s)  Provider:   Chilton Si, MD or Gillian Shields, NP    Other Instructions We have given you information on the Right Start program at [facility]  To prevent or reduce lower extremity swelling: Eat a low salt diet. Salt makes the body hold onto extra fluid which causes swelling. Sit with legs elevated. For example, in the recliner or on an ottoman.  Wear knee-high compression stockings during the daytime. Ones labeled 15-20 mmHg provide good compression.

## 2023-03-19 ENCOUNTER — Telehealth: Payer: Self-pay | Admitting: *Deleted

## 2023-03-19 DIAGNOSIS — M13841 Other specified arthritis, right hand: Secondary | ICD-10-CM | POA: Diagnosis not present

## 2023-03-19 DIAGNOSIS — Z4789 Encounter for other orthopedic aftercare: Secondary | ICD-10-CM | POA: Diagnosis not present

## 2023-03-19 DIAGNOSIS — M25641 Stiffness of right hand, not elsewhere classified: Secondary | ICD-10-CM | POA: Diagnosis not present

## 2023-03-19 NOTE — Telephone Encounter (Signed)
Contacted regarding PREP Class referral. Unable to leave voice message as no mail box has been set up.

## 2023-03-19 NOTE — Telephone Encounter (Signed)
Returned my call regarding PREP Class referral. She is interested in participating at the [facility]. We will call back with future class availability.

## 2023-03-25 ENCOUNTER — Telehealth: Payer: Self-pay

## 2023-03-25 NOTE — Telephone Encounter (Signed)
Called MV:HQIO program schedule at [employer], left voicemail requesting return call.

## 2023-03-27 ENCOUNTER — Telehealth: Payer: Self-pay

## 2023-03-27 NOTE — Telephone Encounter (Signed)
Returned her call, she would like to attend PREP at [facility] on June 18, every T/Th at 10am; will contact first week of June to confirm and set up assessment visit.

## 2023-04-02 DIAGNOSIS — M79641 Pain in right hand: Secondary | ICD-10-CM | POA: Diagnosis not present

## 2023-04-08 DIAGNOSIS — M25642 Stiffness of left hand, not elsewhere classified: Secondary | ICD-10-CM | POA: Diagnosis not present

## 2023-04-16 DIAGNOSIS — M25641 Stiffness of right hand, not elsewhere classified: Secondary | ICD-10-CM | POA: Diagnosis not present

## 2023-04-23 ENCOUNTER — Telehealth: Payer: Self-pay

## 2023-04-23 NOTE — Telephone Encounter (Signed)
Called to confirm participation in next PREP class at [facility], she states she will call me back.

## 2023-04-25 ENCOUNTER — Other Ambulatory Visit: Payer: Self-pay | Admitting: Internal Medicine

## 2023-04-25 DIAGNOSIS — I129 Hypertensive chronic kidney disease with stage 1 through stage 4 chronic kidney disease, or unspecified chronic kidney disease: Secondary | ICD-10-CM

## 2023-04-30 ENCOUNTER — Telehealth: Payer: Self-pay

## 2023-04-30 DIAGNOSIS — M25641 Stiffness of right hand, not elsewhere classified: Secondary | ICD-10-CM | POA: Diagnosis not present

## 2023-04-30 NOTE — Telephone Encounter (Signed)
Follow up call to see if she still plans to attend PREP at [facility] on June 18; has decided to postpone and wants to attend July 1, every M/W 12:30-1:45; will contact end of June to set up assessment visit.

## 2023-05-09 ENCOUNTER — Telehealth: Payer: Self-pay

## 2023-05-09 NOTE — Telephone Encounter (Signed)
Called to confirm participation in next PREP class at [facility] on July 1; assessment visit scheduled for June 24 at 2pm

## 2023-05-12 NOTE — Progress Notes (Signed)
[facility] PREP Evaluation  Patient Details  Name: [name] MRN: [id number] Date of Birth: 1946-05-10 Age: 77 y.o. PCP: Dorothyann Peng, MD  Vitals:   05/12/23 1437  BP: 130/72  Pulse: 88  SpO2: 98%  Weight: 232 lb 9.6 oz (105.5 kg)     [facility] Eval - 05/12/23 1400       [facility] "PREP" Location   [facility] "PREP" Location [name]      Referral    Referring Provider Walker    Reason for referral High Cholesterol;Hypertension;Inactivity;Obesitity/Overweight    Program Start Date 05/19/23      Measurement   Waist Circumference 43 inches    Hip Circumference 51 inches    Body fat 45.9 percent      Information for Trainer   Goals --   Increase strength, stamina, balance, stability; 10-15 pound weight loss by end of program; establish exercise routine/program   Current Exercise none    Orthopedic Concerns --   R thumb reconstruction, L shoulder injury (fall), L knee OA; 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 2    I have no trouble taking out the trash. 4    I do housework such as vacuuming and dusting on my own without difficulty. 4    I can easily lift a gallon of milk (8lbs). 4    I can easily walk a mile. 2    I have no trouble reaching into high cupboards or reaching down to pick up something from the floor. 2    I do not have trouble doing out-door work such as Loss adjuster, chartered, raking leaves, or gardening. 4      Mobility and Daily Activities   I feel younger than my age. 4    I feel independent. 4    I feel energetic. 2    I live an active life.  4    I feel strong. 2    I feel healthy. 2    I feel active as other people my age. 4      How fit and strong are you.   Fit and Strong Total Score 44            Past Medical History:  Diagnosis Date   Back pain     Falls    Fibromyalgia    Hypertension    Joint pain    Joint stiffness    Joint swelling    Pure hypercholesterolemia 01/11/2020   Raynaud's disease    age 40's   Sleep apnea    Varicose veins of both lower extremities    Vitamin B12 deficiency    2020   Vitamin D deficiency    Past Surgical History:  Procedure Laterality Date   BREAST BIOPSY Right    BREAST EXCISIONAL BIOPSY Right    FOOT FRACTURE SURGERY Right    FRACTURE SURGERY     KNEE SURGERY Left 2007   TUBAL LIGATION     Social History   Tobacco Use  Smoking Status Former  Smokeless Tobacco Never  Tobacco Comments   quit at age 8,only smoked for 1-2 years  To begin PREP program at [facility] July 1, every M/W  12:30-145  Sonia Baller 05/12/2023, 2:42 PM

## 2023-05-19 NOTE — Progress Notes (Signed)
[facility] PRE[facility] Weekly Session  Patient Details  Name: [name] MRN: [id number] Date of Birth: 1945/12/26 Age: 77 y.o. PCP: Dorothyann Peng, MD  There were no vitals filed for this visit.   [facility] Weekly seesion - 05/19/23 1400       [facility] "PREP" Location   [facility]PREP[facility] Location [name]      Weekly Session   Topic Discussed Goal setting and welcome to the program   Introductions, review of notebook, tour of facility   Classes attended to date 1             Anysa Tacey B Rio Taber 05/19/2023, 2:39 PM

## 2023-05-26 NOTE — Progress Notes (Signed)
[facility] PREP Weekly Session  Patient Details  Name: [name] MRN: [id number] Date of Birth: 26-Dec-1945 Age: 77 y.o. PCP: Dorothyann Peng, MD  Vitals:   05/26/23 1350  Weight: 229 lb (103.9 kg)     [facility] Weekly seesion - 05/26/23 1300       [facility] "PREP" Location   [facility] "PREP[facility] Location [name]      Weekly Session   Topic Discussed Importance of resistance training;Other ways to be active   Goals: work up to 150 minutes/wk of cardio; strength training 2-3 times/wk for 20-40 minutes; sitting no more than 30 minutes   Minutes exercised this week 5 minutes    Classes attended to date 3             Tuff Clabo B Gianfranco Araki 05/26/2023, 1:50 PM

## 2023-05-28 ENCOUNTER — Ambulatory Visit (INDEPENDENT_AMBULATORY_CARE_PROVIDER_SITE_OTHER): Payer: Medicare HMO

## 2023-05-28 VITALS — Ht 67.0 in | Wt 220.0 lb

## 2023-05-28 DIAGNOSIS — Z0000 Encounter for general adult medical examination without abnormal findings: Secondary | ICD-10-CM

## 2023-05-28 NOTE — Patient Instructions (Signed)
Ms. [name] , Thank you for taking time to come for your Medicare Wellness Visit. I appreciate your ongoing commitment to your health goals. Please review the following plan we discussed and let me know if I can assist you in the future.   These are the goals we discussed:  Goals      Patient Stated     07/15/2019, wants to get out of pain     Patient Stated     07/20/2020, wants to weigh 172 pounds     Patient Stated     04/19/2021, lose weight, stabilize CKD, work on overall wellness     Patient Stated     05/15/2022, alleviate pain in knees, lose weight, maintain state of mind     Patient Stated     05/28/2023, wants to lose weight and work on balance     Weight (lb) < 200 lb (90.7 kg)     07/15/2019, wants to weigh 170 pounds        This is a list of the screening recommended for you and due dates:  Health Maintenance  Topic Date Due   COVID-19 Vaccine (7 - 2023-24 season) 12/02/2022   Flu Shot  06/19/2023   Medicare Annual Wellness Visit  05/27/2024   DTaP/Tdap/Td vaccine (3 - Td or Tdap) 02/17/2028   Pneumonia Vaccine  Completed   DEXA scan (bone density measurement)  Completed   Hepatitis C Screening  Completed   Zoster (Shingles) Vaccine  Completed   HPV Vaccine  Aged Out   Colon Cancer Screening  Discontinued    Advanced directives: Advance directive discussed with you today.   Conditions/risks identified: none  Next appointment: Follow up in one year for your annual wellness visit    Preventive Care 65 Years and Older, Female Preventive care refers to lifestyle choices and visits with your health care provider that can promote health and wellness. What does preventive care include? A yearly physical exam. This is also called an annual well check. Dental exams once or twice a year. Routine eye exams. Ask your health care provider how often you should have your eyes checked. Personal lifestyle choices, including: Daily care of your teeth and gums. Regular  physical activity. Eating a healthy diet. Avoiding tobacco and drug use. Limiting alcohol use. Practicing safe sex. Taking low-dose aspirin every day. Taking vitamin and mineral supplements as recommended by your health care provider. What happens during an annual well check? The services and screenings done by your health care provider during your annual well check will depend on your age, overall health, lifestyle risk factors, and family history of disease. Counseling  Your health care provider may ask you questions about your: Alcohol use. Tobacco use. Drug use. Emotional well-being. Home and relationship well-being. Sexual activity. Eating habits. History of falls. Memory and ability to understand (cognition). Work and work Astronomer. Reproductive health. Screening  You may have the following tests or measurements: Height, weight, and BMI. Blood pressure. Lipid and cholesterol levels. These may be checked every 5 years, or more frequently if you are over 84 years old. Skin check. Lung cancer screening. You may have this screening every year starting at age 19 if you have a 30-pack-year history of smoking and currently smoke or have quit within the past 15 years. Fecal occult blood test (FOBT) of the stool. You may have this test every year starting at age 74. Flexible sigmoidoscopy or colonoscopy. You may have a sigmoidoscopy every 5 years or a  colonoscopy every 10 years starting at age 15. Hepatitis C blood test. Hepatitis B blood test. Sexually transmitted disease (STD) testing. Diabetes screening. This is done by checking your blood sugar (glucose) after you have not eaten for a while (fasting). You may have this done every 1-3 years. Bone density scan. This is done to screen for osteoporosis. You may have this done starting at age 65. Mammogram. This may be done every 1-2 years. Talk to your health care provider about how often you should have regular mammograms. Talk  with your health care provider about your test results, treatment options, and if necessary, the need for more tests. Vaccines  Your health care provider may recommend certain vaccines, such as: Influenza vaccine. This is recommended every year. Tetanus, diphtheria, and acellular pertussis (Tdap, Td) vaccine. You may need a Td booster every 10 years. Zoster vaccine. You may need this after age 54. Pneumococcal 13-valent conjugate (PCV13) vaccine. One dose is recommended after age 54. Pneumococcal polysaccharide (PPSV23) vaccine. One dose is recommended after age 70. Talk to your health care provider about which screenings and vaccines you need and how often you need them. This information is not intended to replace advice given to you by your health care provider. Make sure you discuss any questions you have with your health care provider. Document Released: 12/01/2015 Document Revised: 07/24/2016 Document Reviewed: 09/05/2015 Elsevier Interactive Patient Education  2017 [employer].  Fall Prevention in the Home Falls can cause injuries. They can happen to people of all ages. There are many things you can do to make your home safe and to help prevent falls. What can I do on the outside of my home? Regularly fix the edges of walkways and driveways and fix any cracks. Remove anything that might make you trip as you walk through a door, such as a raised step or threshold. Trim any bushes or trees on the path to your home. Use bright outdoor lighting. Clear any walking paths of anything that might make someone trip, such as rocks or tools. Regularly check to see if handrails are loose or broken. Make sure that both sides of any steps have handrails. Any raised decks and porches should have guardrails on the edges. Have any leaves, snow, or ice cleared regularly. Use sand or salt on walking paths during winter. Clean up any spills in your garage right away. This includes oil or grease  spills. What can I do in the bathroom? Use night lights. Install grab bars by the toilet and in the tub and shower. Do not use towel bars as grab bars. Use non-skid mats or decals in the tub or shower. If you need to sit down in the shower, use a plastic, non-slip stool. Keep the floor dry. Clean up any water that spills on the floor as soon as it happens. Remove soap buildup in the tub or shower regularly. Attach bath mats securely with double-sided non-slip rug tape. Do not have throw rugs and other things on the floor that can make you trip. What can I do in the bedroom? Use night lights. Make sure that you have a light by your bed that is easy to reach. Do not use any sheets or blankets that are too big for your bed. They should not hang down onto the floor. Have a firm chair that has side arms. You can use this for support while you get dressed. Do not have throw rugs and other things on the floor that can  make you trip. What can I do in the kitchen? Clean up any spills right away. Avoid walking on wet floors. Keep items that you use a lot in easy-to-reach places. If you need to reach something above you, use a strong step stool that has a grab bar. Keep electrical cords out of the way. Do not use floor polish or wax that makes floors slippery. If you must use wax, use non-skid floor wax. Do not have throw rugs and other things on the floor that can make you trip. What can I do with my stairs? Do not leave any items on the stairs. Make sure that there are handrails on both sides of the stairs and use them. Fix handrails that are broken or loose. Make sure that handrails are as long as the stairways. Check any carpeting to make sure that it is firmly attached to the stairs. Fix any carpet that is loose or worn. Avoid having throw rugs at the top or bottom of the stairs. If you do have throw rugs, attach them to the floor with carpet tape. Make sure that you have a light switch at the  top of the stairs and the bottom of the stairs. If you do not have them, ask someone to add them for you. What else can I do to help prevent falls? Wear shoes that: Do not have high heels. Have rubber bottoms. Are comfortable and fit you well. Are closed at the toe. Do not wear sandals. If you use a stepladder: Make sure that it is fully opened. Do not climb a closed stepladder. Make sure that both sides of the stepladder are locked into place. Ask someone to hold it for you, if possible. Clearly mark and make sure that you can see: Any grab bars or handrails. First and last steps. Where the edge of each step is. Use tools that help you move around (mobility aids) if they are needed. These include: Canes. Walkers. Scooters. Crutches. Turn on the lights when you go into a dark area. Replace any light bulbs as soon as they burn out. Set up your furniture so you have a clear path. Avoid moving your furniture around. If any of your floors are uneven, fix them. If there are any pets around you, be aware of where they are. Review your medicines with your doctor. Some medicines can make you feel dizzy. This can increase your chance of falling. Ask your doctor what other things that you can do to help prevent falls. This information is not intended to replace advice given to you by your health care provider. Make sure you discuss any questions you have with your health care provider. Document Released: 08/31/2009 Document Revised: 04/11/2016 Document Reviewed: 12/09/2014 Elsevier Interactive Patient Education  2017 [employer].

## 2023-05-28 NOTE — Progress Notes (Signed)
Subjective:   [name] is a 77 y.o. female who presents for Medicare Annual (Subsequent) preventive examination.  Visit Complete: Virtual  I connected with  [name] on 05/28/23 by a audio enabled telemedicine application and verified that I am speaking with the correct person using two identifiers.  Patient Location: Home  Provider Location: Office/Clinic  I discussed the limitations of evaluation and management by telemedicine. The patient expressed understanding and agreed to proceed.    Review of Systems     Cardiac Risk Factors include: advanced age (>79men, >57 women);hypertension     Objective:    Today's Vitals   05/28/23 1635  Weight: 220 lb (99.8 kg)  Height: 5\' 7"  (1.702 m)   Body mass index is 34.46 kg/m.     05/28/2023    4:42 PM 05/15/2022    9:21 AM 11/28/2021   10:55 PM 04/19/2021   10:24 AM 07/20/2020   11:02 AM 07/15/2019   10:13 AM 02/09/2019   10:58 AM  Advanced Directives  Does Patient Have a Medical Advance Directive? No No No No No No No  Would patient like information on creating a medical advance directive?  No - Patient declined  No - Patient declined       Current Medications (verified) Outpatient Encounter Medications as of 05/28/2023  Medication Sig   acetaminophen (TYLENOL) 500 MG tablet Take 1,000 mg by mouth daily as needed for moderate pain.   atorvastatin (LIPITOR) 10 MG tablet TAKE 1 TABLET(10 MG) BY MOUTH DAILY   Black Pepper-Turmeric (TURMERIC COMPLEX/BLACK PEPPER PO) Take 1 tablet by mouth daily.   hydrochlorothiazide (HYDRODIURIL) 25 MG tablet TAKE 1 TABLET(25 MG) BY MOUTH DAILY   lisinopril (ZESTRIL) 10 MG tablet Take 1 tablet (10 mg total) by mouth daily.   Multiple Vitamin (MULTIVITAMIN) capsule Take 1 capsule by mouth daily.   diclofenac (VOLTAREN) 75 MG EC tablet Take 1 tablet (75 mg total) by mouth 2 (two) times daily. (Patient not taking: Reported on 05/28/2023)   Doxylamine Succinate, Sleep, (SLEEP AID PO)  Take 1 tablet by mouth daily. Takes half a tab at bedtime (Patient not taking: Reported on 05/28/2023)   Melatonin 1 MG CAPS Take 1 capsule by mouth daily. (Patient not taking: Reported on 05/28/2023)   No facility-administered encounter medications on file as of 05/28/2023.    Allergies (verified) Penicillins   History: Past Medical History:  Diagnosis Date   Back pain    Falls    Fibromyalgia    Hypertension    Joint pain    Joint stiffness    Joint swelling    Pure hypercholesterolemia 01/11/2020   Raynaud's disease    age 24's   Sleep apnea    Varicose veins of both lower extremities    Vitamin B12 deficiency    2020   Vitamin D deficiency    Past Surgical History:  Procedure Laterality Date   BREAST BIOPSY Right    BREAST EXCISIONAL BIOPSY Right    FOOT FRACTURE SURGERY Right    FRACTURE SURGERY     KNEE SURGERY Left 2007   TUBAL LIGATION     Family History  Problem Relation Age of Onset   Hypertension Mother    Hyperthyroidism Mother    Hypertension Father    Prostate cancer Father    Diabetes Brother    Breast cancer Paternal Aunt    Breast cancer Paternal Grandmother    Peripheral vascular disease Other    Social History   Socioeconomic  History   Marital status: Divorced    Spouse name: Not on file   Number of children: Not on file   Years of education: Not on file   Highest education level: Not on file  Occupational History   Not on file  Tobacco Use   Smoking status: Former   Smokeless tobacco: Never   Tobacco comments:    quit at age 33,only smoked for 1-2 years  Vaping Use   Vaping Use: Never used  Substance and Sexual Activity   Alcohol use: Yes    Comment: 2 drinks weekly   Drug use: No   Sexual activity: Not Currently  Other Topics Concern   Not on file  Social History Narrative   Not on file   Social Determinants of Health   Financial Resource Strain: Low Risk  (05/28/2023)   Overall Financial Resource Strain (CARDIA)     Difficulty of Paying Living Expenses: Not hard at all  Food Insecurity: No Food Insecurity (05/28/2023)   Hunger Vital Sign    Worried About Running Out of Food in the Last Year: Never true    Ran Out of Food in the Last Year: Never true  Transportation Needs: No Transportation Needs (05/28/2023)   PRAPARE - Administrator, Civil Service (Medical): No    Lack of Transportation (Non-Medical): No  Physical Activity: Sufficiently Active (05/28/2023)   Exercise Vital Sign    Days of Exercise per Week: 2 days    Minutes of Exercise per Session: 90 min  Stress: No Stress Concern Present (05/28/2023)   [employer] of Occupational Health - Occupational Stress Questionnaire    Feeling of Stress : Not at all  Social Connections: Moderately Isolated (05/28/2023)   Social Connection and Isolation Panel [NHANES]    Frequency of Communication with Friends and Family: More than three times a week    Frequency of Social Gatherings with Friends and Family: More than three times a week    Attends Religious Services: Never    Database administrator or Organizations: Yes    Attends Engineer, structural: More than 4 times per year    Marital Status: Divorced    Tobacco Counseling Counseling given: Not Answered Tobacco comments: quit at age 33,only smoked for 1-2 years   Clinical Intake:  Pre-visit preparation completed: Yes  Pain : No/denies pain     Nutritional Status: BMI > 30  Obese Nutritional Risks: Nausea/ vomitting/ diarrhea (nausea vomiting in November) Diabetes: No  How often do you need to have someone help you when you read instructions, pamphlets, or other written materials from your doctor or pharmacy?: 1 - Never  Interpreter Needed?: No  Information entered by :: NAllen LPN   Activities of Daily Living    05/28/2023    4:37 PM  In your present state of health, do you have any difficulty performing the following activities:  Hearing? 0  Vision? 0   Difficulty concentrating or making decisions? 0  Walking or climbing stairs? 0  Dressing or bathing? 0  Doing errands, shopping? 0  Preparing Food and eating ? N  Using the Toilet? N  In the past six months, have you accidently leaked urine? N  Do you have problems with loss of bowel control? N  Managing your Medications? N  Managing your Finances? N  Housekeeping or managing your Housekeeping? N    Patient Care Team: Dorothyann Peng, MD as PCP - General (Internal Medicine) Chilton Si, MD  as PCP - Cardiology (Cardiology) Jaymes Graff, MD as Consulting Physician (Obstetrics and Gynecology)  Indicate any recent Medical Services you may have received from other than [facility] providers in the past year (date may be approximate).     Assessment:   This is a routine wellness examination for [place].  Hearing/Vision screen Hearing Screening - Comments:: Denies hearing issues Vision Screening - Comments:: Regular eye exams, Dr. Allena Katz  Dietary issues and exercise activities discussed:     Goals Addressed             This Visit's Progress    Patient Stated       05/28/2023, wants to lose weight and work on balance       Depression Screen    05/28/2023    4:43 PM 12/18/2022    9:38 AM 05/15/2022    9:23 AM 04/19/2021   10:24 AM 07/20/2020   11:03 AM 04/18/2020    9:44 AM 12/09/2019   11:16 AM  PHQ 2/9 Scores  PHQ - 2 Score 0 0 0 0 0 2 0  PHQ- 9 Score 1     8     Fall Risk    05/28/2023    4:42 PM 12/18/2022    9:38 AM 05/15/2022    9:23 AM 04/19/2021   10:24 AM 07/20/2020   11:03 AM  Fall Risk   Falls in the past year? 0 0 0 0 0  Number falls in past yr: 0 0 0    Injury with Fall? 0 0 0    Risk for fall due to : Medication side effect;Impaired balance/gait No Fall Risks Medication side effect Medication side effect Medication side effect  Follow up Falls prevention discussed;Falls evaluation completed Falls evaluation completed Falls evaluation completed;Education  provided;Falls prevention discussed Falls evaluation completed;Education provided;Falls prevention discussed Falls evaluation completed;Education provided;Falls prevention discussed    MEDICARE RISK AT HOME:  Medicare Risk at Home - 05/28/23 1643     Any stairs in or around the home? Yes    If so, are there any without handrails? No    Home free of loose throw rugs in walkways, pet beds, electrical cords, etc? Yes    Adequate lighting in your home to reduce risk of falls? Yes    Life alert? No    Use of a cane, walker or w/c? No    Grab bars in the bathroom? No    Shower chair or bench in shower? No    Elevated toilet seat or a handicapped toilet? Yes             TIMED UP AND GO:  Was the test performed?  No    Cognitive Function:        05/28/2023    4:44 PM 05/15/2022    9:24 AM 07/20/2020   11:05 AM 07/15/2019   10:16 AM  6CIT Screen  What Year? 0 points 0 points 0 points 0 points  What month? 0 points 0 points 0 points 0 points  What time? 0 points 0 points 0 points 0 points  Count back from 20 0 points 0 points 0 points 0 points  Months in reverse 0 points 0 points 0 points 0 points  Repeat phrase 2 points 0 points 0 points 0 points  Total Score 2 points 0 points 0 points 0 points    Immunizations Immunization History  Administered Date(s) Administered   DTaP 06/10/2013   Fluad Quad(high Dose 65+) 07/26/2020, 08/08/2021, 08/20/2022  Influenza Inj Mdck Quad Pf 07/21/2019   Influenza, High Dose Seasonal PF 09/11/2018   Influenza-Unspecified 07/21/2019   PFIZER Comirnaty(Gray Top)Covid-19 Tri-Sucrose Vaccine 06/11/2021, 10/07/2022   PFIZER(Purple Top)SARS-COV-2 Vaccination 12/23/2019, 01/13/2020, 08/14/2020   Pfizer Covid-19 Vaccine Bivalent Booster 74yrs & up 12/10/2021   Pneumococcal Conjugate-13 02/01/2019, 02/02/2019   Pneumococcal Polysaccharide-23 11/28/2021   Pneumococcal-Unspecified 06/10/2013   Tdap 02/16/2018   Zoster Recombinant(Shingrix)  05/17/2021, 11/28/2021    TDAP status: Up to date  Flu Vaccine status: Up to date  Pneumococcal vaccine status: Up to date  Covid-19 vaccine status: Completed vaccines  Qualifies for Shingles Vaccine? Yes   Zostavax completed Yes   Shingrix Completed?: Yes  Screening Tests Health Maintenance  Topic Date Due   COVID-19 Vaccine (7 - 2023-24 season) 12/02/2022   INFLUENZA VACCINE  06/19/2023   Medicare Annual Wellness (AWV)  05/27/2024   DTaP/Tdap/Td (3 - Td or Tdap) 02/17/2028   Pneumonia Vaccine 11+ Years old  Completed   DEXA SCAN  Completed   Hepatitis C Screening  Completed   Zoster Vaccines- Shingrix  Completed   HPV VACCINES  Aged Out   Colonoscopy  Discontinued    Health Maintenance  Health Maintenance Due  Topic Date Due   COVID-19 Vaccine (7 - 2023-24 season) 12/02/2022    Colorectal cancer screening: Type of screening: Colonoscopy. Completed 06/05/2017. Repeat every 10 years  Mammogram status: Completed 09/26/2021. Repeat every year  Bone Density status: Completed 05/31/2021.   Lung Cancer Screening: (Low Dose CT Chest recommended if Age 73-80 years, 20 pack-year currently smoking OR have quit w/in 15years.) does not qualify.   Lung Cancer Screening Referral: no  Additional Screening:  Hepatitis C Screening: does qualify; Completed 05/05/2017  Vision Screening: Recommended annual ophthalmology exams for early detection of glaucoma and other disorders of the eye. Is the patient up to date with their annual eye exam?  Yes  Who is the provider or what is the name of the office in which the patient attends annual eye exams? Dr. Allena Katz If pt is not established with a provider, would they like to be referred to a provider to establish care? No .   Dental Screening: Recommended annual dental exams for proper oral hygiene  Diabetic Foot Exam: n/a  Community Resource Referral / Chronic Care Management: CRR required this visit?  No   CCM required this visit?   No     Plan:     I have personally reviewed and noted the following in the patient's chart:   Medical and social history Use of alcohol, tobacco or illicit drugs  Current medications and supplements including opioid prescriptions. Patient is not currently taking opioid prescriptions. Functional ability and status Nutritional status Physical activity Advanced directives List of other physicians Hospitalizations, surgeries, and ER visits in previous 12 months Vitals Screenings to include cognitive, depression, and falls Referrals and appointments  In addition, I have reviewed and discussed with patient certain preventive protocols, quality metrics, and best practice recommendations. A written personalized care plan for preventive services as well as general preventive health recommendations were provided to patient.     Barb Merino, LPN   1/61/0960   After Visit Summary: (MyChart) Due to this being a telephonic visit, the after visit summary with patients personalized plan was offered to patient via MyChart   Nurse Notes: none

## 2023-06-02 NOTE — Progress Notes (Signed)
[facility] PREP Weekly Session  Patient Details  Name: [name] MRN: [id number] Date of Birth: 03-31-1946 Age: 77 y.o. PCP: Dorothyann Peng, MD  There were no vitals filed for this visit.   [facility] Weekly seesion - 06/02/23 1300       [facility] "PREP" Location   [facility] "PREP" Location [name]      Weekly Session   Topic Discussed Healthy eating tips   Eat the rainbow of colors; introduced YUKA app; foods to reduce, foods to increase   Classes attended to date 5             Charizma Gardiner B Lorree Millar 06/02/2023, 1:47 PM

## 2023-06-09 NOTE — Progress Notes (Signed)
[facility] PREP Weekly Session  Patient Details  Name: [name] MRN: [id number] Date of Birth: [birth year] Age: 77 y.o. PCP: Dorothyann Peng, MD  Vitals:   06/09/23 1346  Weight: 232 lb (105.2 kg)     [facility] Weekly seesion - 06/09/23 1300       [facility] "PREP" Location   [facility] "PREP" Location [name]      Weekly Session   Topic Discussed Health habits   Sugar demo; water: 1/2 body wt in oz   Classes attended to date 7             Imer Foxworth B Aricela Bertagnolli 06/09/2023, 1:48 PM

## 2023-06-11 DIAGNOSIS — Z4789 Encounter for other orthopedic aftercare: Secondary | ICD-10-CM | POA: Diagnosis not present

## 2023-06-11 DIAGNOSIS — M13841 Other specified arthritis, right hand: Secondary | ICD-10-CM | POA: Diagnosis not present

## 2023-06-23 NOTE — Progress Notes (Signed)
[facility] PREP Weekly Session  Patient Details  Name: [name] MRN: [id number] Date of Birth: 11-Mar-1946 Age: 77 y.o. PCP: Dorothyann Peng, MD  There were no vitals filed for this visit.   [facility] Weekly seesion - 06/23/23 1300       [facility] "PREP" Location   [facility] "PREP[facility] Location [name]      Weekly Session   Topic Discussed Stress management and problem solving   Importance of sleep; finger tip mudra practice to manage stress   Classes attended to date 37              B  06/23/2023, 1:44 PM

## 2023-07-16 ENCOUNTER — Other Ambulatory Visit: Payer: Self-pay | Admitting: Internal Medicine

## 2023-07-16 DIAGNOSIS — I129 Hypertensive chronic kidney disease with stage 1 through stage 4 chronic kidney disease, or unspecified chronic kidney disease: Secondary | ICD-10-CM

## 2023-08-28 ENCOUNTER — Ambulatory Visit: Payer: Medicare HMO | Admitting: Internal Medicine

## 2023-09-01 ENCOUNTER — Other Ambulatory Visit: Payer: Self-pay

## 2023-09-01 ENCOUNTER — Encounter: Payer: Medicare HMO | Admitting: Internal Medicine

## 2023-09-01 ENCOUNTER — Other Ambulatory Visit: Payer: Medicare HMO

## 2023-09-01 DIAGNOSIS — I129 Hypertensive chronic kidney disease with stage 1 through stage 4 chronic kidney disease, or unspecified chronic kidney disease: Secondary | ICD-10-CM | POA: Diagnosis not present

## 2023-09-01 NOTE — Progress Notes (Signed)
erroneous

## 2023-09-01 NOTE — Progress Notes (Deleted)
I,Bethel Gaglio T Deloria Lair, CMA,acting as a Neurosurgeon for Gwynneth Aliment, MD.,have documented all relevant documentation on the behalf of Gwynneth Aliment, MD,as directed by  Gwynneth Aliment, MD while in the presence of Gwynneth Aliment, MD.  Subjective:    Patient ID: [name] , female    DOB: Apr 20, 1946 , 77 y.o.   MRN: [id number]  No chief complaint on file.   HPI  Patient here for physical exam She is no longer followed by GYN. She reports compliance with meds. Denies headaches, chest pain and shortness of breath. She is not willing to weigh until she reaches her goal weight. She hopes to reach this by her next visit.   Hypertension This is a chronic problem. The current episode started more than 1 year ago. The problem has been gradually improving since onset. The problem is uncontrolled. Pertinent negatives include no blurred vision, chest pain, palpitations or shortness of breath. The current treatment provides moderate improvement.     Past Medical History:  Diagnosis Date   Back pain    Falls    Fibromyalgia    Hypertension    Joint pain    Joint stiffness    Joint swelling    Pure hypercholesterolemia 01/11/2020   Raynaud's disease    age 54's   Sleep apnea    Varicose veins of both lower extremities    Vitamin B12 deficiency    2020   Vitamin D deficiency      Family History  Problem Relation Age of Onset   Hypertension Mother    Hyperthyroidism Mother    Hypertension Father    Prostate cancer Father    Diabetes Brother    Breast cancer Paternal Aunt    Breast cancer Paternal Grandmother    Peripheral vascular disease Other      Current Outpatient Medications:    acetaminophen (TYLENOL) 500 MG tablet, Take 1,000 mg by mouth daily as needed for moderate pain., Disp: , Rfl:    atorvastatin (LIPITOR) 10 MG tablet, TAKE 1 TABLET(10 MG) BY MOUTH DAILY, Disp: 90 tablet, Rfl: 1   Black Pepper-Turmeric (TURMERIC COMPLEX/BLACK PEPPER PO), Take 1 tablet by mouth  daily., Disp: , Rfl:    diclofenac (VOLTAREN) 75 MG EC tablet, Take 1 tablet (75 mg total) by mouth 2 (two) times daily. (Patient not taking: Reported on 05/28/2023), Disp: 50 tablet, Rfl: 2   Doxylamine Succinate, Sleep, (SLEEP AID PO), Take 1 tablet by mouth daily. Takes half a tab at bedtime (Patient not taking: Reported on 05/28/2023), Disp: , Rfl:    hydrochlorothiazide (HYDRODIURIL) 25 MG tablet, TAKE 1 TABLET(25 MG) BY MOUTH DAILY, Disp: 30 tablet, Rfl: 1   lisinopril (ZESTRIL) 10 MG tablet, Take 1 tablet (10 mg total) by mouth daily., Disp: 90 tablet, Rfl: 1   Melatonin 1 MG CAPS, Take 1 capsule by mouth daily. (Patient not taking: Reported on 05/28/2023), Disp: , Rfl:    Multiple Vitamin (MULTIVITAMIN) capsule, Take 1 capsule by mouth daily., Disp: , Rfl:    Allergies  Allergen Reactions   Penicillins     Does not remember      The patient states she uses {contraceptive methods:5051} for birth control. No LMP recorded. Patient is postmenopausal.. {Dysmenorrhea-menorrhagia:21918}. Negative for: breast discharge, breast lump(s), breast pain and breast self exam. Associated symptoms include abnormal vaginal bleeding. Pertinent negatives include abnormal bleeding (hematology), anxiety, decreased libido, depression, difficulty falling sleep, dyspareunia, history of infertility, nocturia, sexual dysfunction, sleep disturbances, urinary incontinence, urinary urgency,  vaginal discharge and vaginal itching. Diet regular.The patient states her exercise level is    . The patient's tobacco use is:  Social History   Tobacco Use  Smoking Status Former  Smokeless Tobacco Never  Tobacco Comments   quit at age 63,only smoked for 1-2 years  . She has been exposed to passive smoke. The patient's alcohol use is:  Social History   Substance and Sexual Activity  Alcohol Use Yes   Comment: 2 drinks weekly  . Additional information: Last pap ***, next one scheduled for ***.    Review of Systems   Constitutional: Negative.   HENT: Negative.    Eyes: Negative.  Negative for blurred vision.  Respiratory: Negative.  Negative for shortness of breath.   Cardiovascular: Negative.  Negative for chest pain and palpitations.  Gastrointestinal: Negative.   Endocrine: Negative.   Genitourinary: Negative.   Musculoskeletal: Negative.   Skin: Negative.   Allergic/Immunologic: Negative.   Neurological: Negative.   Hematological: Negative.   Psychiatric/Behavioral: Negative.       There were no vitals filed for this visit. There is no height or weight on file to calculate BMI.  Wt Readings from Last 3 Encounters:  06/09/23 232 lb (105.2 kg)  05/28/23 220 lb (99.8 kg)  05/26/23 229 lb (103.9 kg)     Objective:  Physical Exam      Assessment And Plan:     Encounter for general adult medical examination w/o abnormal findings  Parenchymal renal hypertension, stage 1 through stage 4 or unspecified chronic kidney disease  Pure hypercholesterolemia  Stage 3a chronic kidney disease (HCC)  Primary insomnia  Dyspepsia     No follow-ups on file. Patient was given opportunity to ask questions. Patient verbalized understanding of the plan and was able to repeat key elements of the plan. All questions were answered to their satisfaction.   Gwynneth Aliment, MD  I, Gwynneth Aliment, MD, have reviewed all documentation for this visit. The documentation on 09/01/23 for the exam, diagnosis, procedures, and orders are all accurate and complete.

## 2023-09-01 NOTE — Progress Notes (Signed)
I,Briann Sarchet T Deloria Lair, CMA,acting as a Neurosurgeon for [place], CMA.,have documented all relevant documentation on the behalf of [place] Breeze, [place] directed by  Coolidge Breeze, CMA while in the presence of [place] Breeze, [place].  Subjective:  Patient ID: [name] , female    DOB: Feb 05, 1946 , 77 y.o.   MRN: [id number]  No chief complaint on file.   HPI  HPI   Past Medical History:  Diagnosis Date   Back pain    Falls    Fibromyalgia    Hypertension    Joint pain    Joint stiffness    Joint swelling    Pure hypercholesterolemia 01/11/2020   Raynaud's disease    age 42's   Sleep apnea    Varicose veins of both lower extremities    Vitamin B12 deficiency    2020   Vitamin D deficiency      Family History  Problem Relation Age of Onset   Hypertension Mother    Hyperthyroidism Mother    Hypertension Father    Prostate cancer Father    Diabetes Brother    Breast cancer Paternal Aunt    Breast cancer Paternal Grandmother    Peripheral vascular disease Other      Current Outpatient Medications:    acetaminophen (TYLENOL) 500 MG tablet, Take 1,000 mg by mouth daily as needed for moderate pain., Disp: , Rfl:    atorvastatin (LIPITOR) 10 MG tablet, TAKE 1 TABLET(10 MG) BY MOUTH DAILY, Disp: 90 tablet, Rfl: 1   Black Pepper-Turmeric (TURMERIC COMPLEX/BLACK PEPPER PO), Take 1 tablet by mouth daily., Disp: , Rfl:    diclofenac (VOLTAREN) 75 MG EC tablet, Take 1 tablet (75 mg total) by mouth 2 (two) times daily. (Patient not taking: Reported on 05/28/2023), Disp: 50 tablet, Rfl: 2   Doxylamine Succinate, Sleep, (SLEEP AID PO), Take 1 tablet by mouth daily. Takes half a tab at bedtime (Patient not taking: Reported on 05/28/2023), Disp: , Rfl:    hydrochlorothiazide (HYDRODIURIL) 25 MG tablet, TAKE 1 TABLET(25 MG) BY MOUTH DAILY, Disp: 30 tablet, Rfl: 1   lisinopril (ZESTRIL) 10 MG tablet, Take 1 tablet (10 mg total) by mouth daily., Disp: 90 tablet, Rfl:  1   Melatonin 1 MG CAPS, Take 1 capsule by mouth daily. (Patient not taking: Reported on 05/28/2023), Disp: , Rfl:    Multiple Vitamin (MULTIVITAMIN) capsule, Take 1 capsule by mouth daily., Disp: , Rfl:    Allergies  Allergen Reactions   Penicillins     Does not remember     Review of Systems   There were no vitals filed for this visit. There is no height or weight on file to calculate BMI.  Wt Readings from Last 3 Encounters:  06/09/23 232 lb (105.2 kg)  05/28/23 220 lb (99.8 kg)  05/26/23 229 lb (103.9 kg)     Objective:  Physical Exam      Assessment And Plan:  There are no diagnoses linked to this encounter.   No follow-ups on file.  Patient was given opportunity to ask questions. Patient verbalized understanding of the plan and was able to repeat key elements of the plan. All questions were answered to their satisfaction.  [place] Breeze, CMA  I, [place], CMA, have reviewed all documentation for this visit. The documentation on 09/01/23 for the exam, diagnosis, procedures, and orders are all accurate and complete.   IF YOU HAVE BEEN REFERRED TO A SPECIALIST, IT MAY TAKE 1-2  WEEKS TO SCHEDULE/PROCESS THE REFERRAL. IF YOU HAVE NOT HEARD FROM US/SPECIALIST IN TWO WEEKS, PLEASE GIVE [place] A CALL AT [phone].   THE PATIENT IS ENCOURAGED TO PRACTICE SOCIAL DISTANCING DUE TO THE COVID-19 PANDEMIC.

## 2023-09-03 LAB — CMP14+EGFR
ALT: 18 [IU]/L (ref 0–32)
AST: 21 [IU]/L (ref 0–40)
Albumin: 4.2 g/dL (ref 3.8–4.8)
Alkaline Phosphatase: 84 [IU]/L (ref 44–121)
BUN/Creatinine Ratio: 16 (ref 12–28)
BUN: 16 mg/dL (ref 8–27)
Bilirubin Total: 0.3 mg/dL (ref 0.0–1.2)
CO2: 25 mmol/L (ref 20–29)
Calcium: 9.6 mg/dL (ref 8.7–10.3)
Chloride: 104 mmol/L (ref 96–106)
Creatinine, Ser: 0.99 mg/dL (ref 0.57–1.00)
Globulin, Total: 2.8 g/dL (ref 1.5–4.5)
Glucose: 92 mg/dL (ref 70–99)
Potassium: 4.7 mmol/L (ref 3.5–5.2)
Sodium: 141 mmol/L (ref 134–144)
Total Protein: 7 g/dL (ref 6.0–8.5)
eGFR: 59 mL/min/{1.73_m2} — ABNORMAL LOW (ref >59)

## 2023-09-03 LAB — LIPID PANEL
Chol/HDL Ratio: 3.5 {ratio} (ref 0.0–4.4)
Cholesterol, Total: 209 mg/dL — ABNORMAL HIGH (ref 100–199)
HDL: 60 mg/dL (ref >39)
LDL Chol Calc (NIH): 128 mg/dL — ABNORMAL HIGH (ref 0–99)
Triglycerides: 117 mg/dL (ref 0–149)
VLDL Cholesterol Cal: 21 mg/dL (ref 5–40)

## 2023-09-03 LAB — CBC
Hematocrit: 41.2 % (ref 34.0–46.6)
Hemoglobin: 12.8 g/dL (ref 11.1–15.9)
MCH: 30.7 pg (ref 26.6–33.0)
MCHC: 31.1 g/dL — ABNORMAL LOW (ref 31.5–35.7)
MCV: 99 fL — ABNORMAL HIGH (ref 79–97)
Platelets: 282 10*3/uL (ref 150–450)
RBC: 4.17 x10E6/uL (ref 3.77–5.28)
RDW: 12.5 % (ref 11.7–15.4)
WBC: 4.7 10*3/uL (ref 3.4–10.8)

## 2023-09-03 LAB — TSH: TSH: 1.65 u[IU]/mL (ref 0.450–4.500)

## 2023-09-07 NOTE — Progress Notes (Signed)
This encounter was created in error - please disregard.

## 2023-09-12 ENCOUNTER — Ambulatory Visit: Payer: Medicare HMO | Admitting: Family Medicine

## 2023-09-12 ENCOUNTER — Encounter: Payer: Self-pay | Admitting: Family Medicine

## 2023-09-12 VITALS — BP 120/80 | HR 73 | Temp 97.8°F

## 2023-09-12 DIAGNOSIS — I129 Hypertensive chronic kidney disease with stage 1 through stage 4 chronic kidney disease, or unspecified chronic kidney disease: Secondary | ICD-10-CM

## 2023-09-12 DIAGNOSIS — E7800 Pure hypercholesterolemia, unspecified: Secondary | ICD-10-CM

## 2023-09-12 DIAGNOSIS — Z23 Encounter for immunization: Secondary | ICD-10-CM

## 2023-09-12 DIAGNOSIS — N1831 Chronic kidney disease, stage 3a: Secondary | ICD-10-CM

## 2023-09-12 DIAGNOSIS — Z2821 Immunization not carried out because of patient refusal: Secondary | ICD-10-CM | POA: Diagnosis not present

## 2023-09-12 DIAGNOSIS — I739 Peripheral vascular disease, unspecified: Secondary | ICD-10-CM | POA: Insufficient documentation

## 2023-09-12 DIAGNOSIS — N183 Chronic kidney disease, stage 3 unspecified: Secondary | ICD-10-CM | POA: Diagnosis not present

## 2023-09-12 DIAGNOSIS — Z6837 Body mass index (BMI) 37.0-37.9, adult: Secondary | ICD-10-CM

## 2023-09-12 DIAGNOSIS — Z0000 Encounter for general adult medical examination without abnormal findings: Secondary | ICD-10-CM

## 2023-09-12 DIAGNOSIS — E66812 Obesity, class 2: Secondary | ICD-10-CM | POA: Diagnosis not present

## 2023-09-12 MED ORDER — ATORVASTATIN CALCIUM 10 MG PO TABS: ORAL_TABLET | ORAL | 1 refills | Status: DC

## 2023-09-12 MED ORDER — HYDROCHLOROTHIAZIDE 25 MG PO TABS
ORAL_TABLET | ORAL | 5 refills | Status: DC
Start: 2023-09-12 — End: 2024-04-28

## 2023-09-12 MED ORDER — LISINOPRIL 10 MG PO TABS
10.0000 mg | ORAL_TABLET | Freq: Every day | ORAL | 1 refills | Status: DC
Start: 2023-09-12 — End: 2024-03-19

## 2023-09-12 NOTE — Progress Notes (Unsigned)
I,[name], CMA,acting as a Neurosurgeon for [employer], NP.,have documented all relevant documentation on the behalf of [name], NP,as directed by  [name], NP while in the presence of [name], NP.  Subjective:    Patient ID: [name] , female    DOB: 12-16-1945 , 77 y.o.   MRN: [id number]  Chief Complaint  Patient presents with   Annual Exam    HPI  Patient is a 77 year old female who presents today  for her annual exam.She is no longer followed by GYN. She reports compliance with meds. Denies headaches, chest pain and shortness of breath. Patient complains of a pain in her right hip/buttock area.      Past Medical History:  Diagnosis Date   Back pain    Falls    Fibromyalgia    Hypertension    Joint pain    Joint stiffness    Joint swelling    Pure hypercholesterolemia 01/11/2020   Raynaud's disease    age 78's   Sleep apnea    Varicose veins of both lower extremities    Vitamin B12 deficiency    2020   Vitamin D deficiency      Family History  Problem Relation Age of Onset   Hypertension Mother    Hyperthyroidism Mother    Hypertension Father    Prostate cancer Father    Diabetes Brother    Breast cancer Paternal Aunt    Breast cancer Paternal Grandmother    Peripheral vascular disease Other      Current Outpatient Medications:    acetaminophen (TYLENOL) 500 MG tablet, Take 1,000 mg by mouth daily as needed for moderate pain., Disp: , Rfl:    atorvastatin (LIPITOR) 10 MG tablet, TAKE 1 TABLET(10 MG) BY MOUTH DAILY, Disp: 90 tablet, Rfl: 1   Black Pepper-Turmeric (TURMERIC COMPLEX/BLACK PEPPER PO), Take 1 tablet by mouth daily., Disp: , Rfl:    diclofenac (VOLTAREN) 75 MG EC tablet, Take 1 tablet (75 mg total) by mouth 2 (two) times daily. (Patient not taking: Reported on 05/28/2023), Disp: 50 tablet, Rfl: 2   Doxylamine Succinate, Sleep, (SLEEP AID PO), Take 1 tablet by mouth daily. Takes half a tab at bedtime (Patient not taking:  Reported on 05/28/2023), Disp: , Rfl:    hydrochlorothiazide (HYDRODIURIL) 25 MG tablet, TAKE 1 TABLET(25 MG) BY MOUTH DAILY, Disp: 30 tablet, Rfl: 5   lisinopril (ZESTRIL) 10 MG tablet, Take 1 tablet (10 mg total) by mouth daily., Disp: 90 tablet, Rfl: 1   Melatonin 1 MG CAPS, Take 1 capsule by mouth daily. (Patient not taking: Reported on 05/28/2023), Disp: , Rfl:    Multiple Vitamin (MULTIVITAMIN) capsule, Take 1 capsule by mouth daily., Disp: , Rfl:    Allergies  Allergen Reactions   Penicillins     Does not remember       Social History   Tobacco Use  Smoking Status Former  Smokeless Tobacco Never  Tobacco Comments   quit at age 55,only smoked for 1-2 years   Social History   Substance and Sexual Activity  Alcohol Use Yes   Comment: 2 drinks weekly    Review of Systems  Constitutional: Negative.   HENT: Negative.    Eyes: Negative.   Respiratory: Negative.    Cardiovascular: Negative.   Gastrointestinal: Negative.   Endocrine: Negative.   Genitourinary: Negative.   Musculoskeletal: Negative.   Skin: Negative.   Allergic/Immunologic: Negative.   Neurological: Negative.   Hematological: Negative.  Psychiatric/Behavioral: Negative.       Today's Vitals   09/12/23 0935  BP: 120/80  Pulse: 73  Temp: 97.8 F (36.6 C)  PainSc: 6   PainLoc: Hip   There is no height or weight on file to calculate BMI.  Wt Readings from Last 3 Encounters:  06/09/23 232 lb (105.2 kg)  05/28/23 220 lb (99.8 kg)  05/26/23 229 lb (103.9 kg)     Objective:  Physical Exam Constitutional:      Appearance: Normal appearance.  HENT:     Head: Normocephalic.  Cardiovascular:     Rate and Rhythm: Normal rate and regular rhythm.     Pulses: Normal pulses.     Heart sounds: Normal heart sounds.  Pulmonary:     Effort: Pulmonary effort is normal.     Breath sounds: Normal breath sounds.  Abdominal:     General: Bowel sounds are normal.  Musculoskeletal:        General: Normal  range of motion.  Skin:    General: Skin is warm and dry.  Neurological:     General: No focal deficit present.     Mental Status: She is alert and oriented to person, place, and time. Mental status is at baseline.  Psychiatric:        Mood and Affect: Mood normal.         Assessment And Plan:     Encounter for general adult medical examination w/o abnormal findings  Pure hypercholesterolemia -     Atorvastatin Calcium; TAKE 1 TABLET(10 MG) BY MOUTH DAILY  Dispense: 90 tablet; Refill: 1  Immunization due -     Flu Vaccine Trivalent High Dose (Fluad)  COVID-19 vaccination declined  Claudication of lower extremity (HCC)  Benign hypertension with CKD (chronic kidney disease) stage III (HCC) -     Microalbumin / creatinine urine ratio -     Lisinopril; Take 1 tablet (10 mg total) by mouth daily.  Dispense: 90 tablet; Refill: 1 -     hydroCHLOROthiazide; TAKE 1 TABLET(25 MG) BY MOUTH DAILY  Dispense: 30 tablet; Refill: 5  Class 2 severe obesity with serious comorbidity and body mass index (BMI) of 37.0 to 37.9 in adult, unspecified obesity type [facility]) Assessment & Plan: She is encouraged to strive for BMI less than 30 to decrease cardiac risk. Advised to aim for at least 150 minutes of exercise per week.       Return in 4 months (on 01/13/2024) for 1 year physical. Patient was given opportunity to ask questions. Patient verbalized understanding of the plan and was able to repeat key elements of the plan. All questions were answered to their satisfaction.   [name], NP  I, [name], NP, have reviewed all documentation for this visit. The documentation on 09/17/23 for the exam, diagnosis, procedures, and orders are all accurate and complete.

## 2023-09-12 NOTE — Patient Instructions (Signed)

## 2023-09-13 LAB — MICROALBUMIN / CREATININE URINE RATIO
Creatinine, Urine: 123.7 mg/dL
Microalb/Creat Ratio: 4 mg/g{creat} (ref 0–29)
Microalbumin, Urine: 5.4 ug/mL

## 2023-09-18 NOTE — Assessment & Plan Note (Signed)
She is encouraged to strive for BMI less than 30 to decrease cardiac risk. Advised to aim for at least 150 minutes of exercise per week.

## 2023-09-29 ENCOUNTER — Ambulatory Visit: Payer: Self-pay | Admitting: Internal Medicine

## 2023-12-02 ENCOUNTER — Ambulatory Visit: Payer: Medicare HMO | Admitting: Internal Medicine

## 2023-12-02 ENCOUNTER — Other Ambulatory Visit: Payer: Self-pay | Admitting: Internal Medicine

## 2023-12-02 DIAGNOSIS — Z0000 Encounter for general adult medical examination without abnormal findings: Secondary | ICD-10-CM

## 2023-12-02 NOTE — Progress Notes (Deleted)
 I,Daulton Harbaugh T Emmitt, CMA,acting as a neurosurgeon for Catheryn LOISE Slocumb, MD.,have documented all relevant documentation on the behalf of Catheryn LOISE Slocumb, MD,as directed by  Catheryn LOISE Slocumb, MD while in the presence of Catheryn LOISE Slocumb, MD.  Subjective:  Patient ID: [name] , female    DOB: [birth year] , 78 y.o.   MRN: [id number]  No chief complaint on file.   HPI  Patient presents today for right breast mass along with soreness. She reports initially noticing this      Past Medical History:  Diagnosis Date   Back pain    Falls    Fibromyalgia    Hypertension    Joint pain    Joint stiffness    Joint swelling    Pure hypercholesterolemia 01/11/2020   Raynaud's disease    age 32's   Sleep apnea    Varicose veins of both lower extremities    Vitamin B12 deficiency    2020   Vitamin D  deficiency      Family History  Problem Relation Age of Onset   Hypertension Mother    Hyperthyroidism Mother    Hypertension Father    Prostate cancer Father    Diabetes Brother    Breast cancer Paternal Aunt    Breast cancer Paternal Grandmother    Peripheral vascular disease Other      Current Outpatient Medications:    acetaminophen  (TYLENOL ) 500 MG tablet, Take 1,000 mg by mouth daily as needed for moderate pain., Disp: , Rfl:    atorvastatin  (LIPITOR) 10 MG tablet, TAKE 1 TABLET(10 MG) BY MOUTH DAILY, Disp: 90 tablet, Rfl: 1   Black Pepper-Turmeric (TURMERIC COMPLEX/BLACK PEPPER PO), Take 1 tablet by mouth daily., Disp: , Rfl:    diclofenac  (VOLTAREN ) 75 MG EC tablet, Take 1 tablet (75 mg total) by mouth 2 (two) times daily. (Patient not taking: Reported on 05/28/2023), Disp: 50 tablet, Rfl: 2   Doxylamine Succinate, Sleep, (SLEEP AID PO), Take 1 tablet by mouth daily. Takes half a tab at bedtime (Patient not taking: Reported on 05/28/2023), Disp: , Rfl:    hydrochlorothiazide  (HYDRODIURIL ) 25 MG tablet, TAKE 1 TABLET(25 MG) BY MOUTH DAILY, Disp: 30 tablet, Rfl: 5   lisinopril   (ZESTRIL ) 10 MG tablet, Take 1 tablet (10 mg total) by mouth daily., Disp: 90 tablet, Rfl: 1   Melatonin 1 MG CAPS, Take 1 capsule by mouth daily. (Patient not taking: Reported on 05/28/2023), Disp: , Rfl:    Multiple Vitamin (MULTIVITAMIN) capsule, Take 1 capsule by mouth daily., Disp: , Rfl:    Allergies  Allergen Reactions   Penicillins     Does not remember     Review of Systems  Constitutional: Negative.   Respiratory: Negative.    Cardiovascular: Negative.   Neurological: Negative.   Psychiatric/Behavioral: Negative.       There were no vitals filed for this visit. There is no height or weight on file to calculate BMI.  Wt Readings from Last 3 Encounters:  06/09/23 232 lb (105.2 kg)  05/28/23 220 lb (99.8 kg)  05/26/23 229 lb (103.9 kg)     Objective:  Physical Exam      Assessment And Plan:  Mass of right breast, unspecified quadrant     No follow-ups on file.  Patient was given opportunity to ask questions. Patient verbalized understanding of the plan and was able to repeat key elements of the plan. All questions were answered to their satisfaction.  Catheryn LOISE Slocumb, MD  I,  Catheryn LOISE Slocumb, MD, have reviewed all documentation for this visit. The documentation on 12/02/23 for the exam, diagnosis, procedures, and orders are all accurate and complete.   IF YOU HAVE BEEN REFERRED TO A SPECIALIST, IT MAY TAKE 1-2 WEEKS TO SCHEDULE/PROCESS THE REFERRAL. IF YOU HAVE NOT HEARD FROM US /SPECIALIST IN TWO WEEKS, PLEASE GIVE US  A CALL AT [phone].   THE PATIENT IS ENCOURAGED TO PRACTICE SOCIAL DISTANCING DUE TO THE COVID-19 PANDEMIC.

## 2023-12-04 ENCOUNTER — Ambulatory Visit: Payer: Medicare HMO | Admitting: Internal Medicine

## 2023-12-04 ENCOUNTER — Ambulatory Visit: Payer: Medicare HMO | Admitting: Nurse Practitioner

## 2023-12-04 ENCOUNTER — Encounter: Payer: Self-pay | Admitting: Nurse Practitioner

## 2023-12-04 VITALS — BP 120/80 | HR 66 | Temp 97.8°F | Ht 67.0 in

## 2023-12-04 DIAGNOSIS — R202 Paresthesia of skin: Secondary | ICD-10-CM

## 2023-12-04 DIAGNOSIS — N644 Mastodynia: Secondary | ICD-10-CM | POA: Diagnosis not present

## 2023-12-04 DIAGNOSIS — Z1231 Encounter for screening mammogram for malignant neoplasm of breast: Secondary | ICD-10-CM

## 2023-12-04 DIAGNOSIS — Z2821 Immunization not carried out because of patient refusal: Secondary | ICD-10-CM | POA: Diagnosis not present

## 2023-12-04 DIAGNOSIS — R200 Anesthesia of skin: Secondary | ICD-10-CM | POA: Diagnosis not present

## 2023-12-04 DIAGNOSIS — N6311 Unspecified lump in the right breast, upper outer quadrant: Secondary | ICD-10-CM | POA: Diagnosis not present

## 2023-12-04 NOTE — Progress Notes (Unsigned)
Madelaine Bhat, CMA,acting as a Neurosurgeon for Arnette Felts, FNP.,have documented all relevant documentation on the behalf of Arnette Felts, FNP,as directed by  Arnette Felts, FNP while in the presence of Arnette Felts, FNP.  Subjective:  Patient ID: [name] , female    DOB: Aug 10, 1946 , 78 y.o.   MRN: [id number]  No chief complaint on file.   HPI  Patient presents today for numbness and tingling in her left leg starting a year ago she reports it is getting worse and mostly at night. The sensation is consistent worse at night. She reports the same feeling in her left arm this is new.    She states her right breast is tender with a lump she first noticed 3 days ago. Patient reports she thinks the missed her mammogram last year.       Past Medical History:  Diagnosis Date   Back pain    Falls    Fibromyalgia    Hypertension    Joint pain    Joint stiffness    Joint swelling    Pure hypercholesterolemia 01/11/2020   Raynaud's disease    age 89's   Sleep apnea    Varicose veins of both lower extremities    Vitamin B12 deficiency    2020   Vitamin D deficiency      Family History  Problem Relation Age of Onset   Hypertension Mother    Hyperthyroidism Mother    Hypertension Father    Prostate cancer Father    Diabetes Brother    Breast cancer Paternal Aunt    Breast cancer Paternal Grandmother    Peripheral vascular disease Other      Current Outpatient Medications:    acetaminophen (TYLENOL) 500 MG tablet, Take 1,000 mg by mouth daily as needed for moderate pain., Disp: , Rfl:    atorvastatin (LIPITOR) 10 MG tablet, TAKE 1 TABLET(10 MG) BY MOUTH DAILY, Disp: 90 tablet, Rfl: 1   Black Pepper-Turmeric (TURMERIC COMPLEX/BLACK PEPPER PO), Take 1 tablet by mouth daily., Disp: , Rfl:    diclofenac (VOLTAREN) 75 MG EC tablet, Take 1 tablet (75 mg total) by mouth 2 (two) times daily. (Patient not taking: Reported on 05/28/2023), Disp: 50 tablet, Rfl: 2   Doxylamine  Succinate, Sleep, (SLEEP AID PO), Take 1 tablet by mouth daily. Takes half a tab at bedtime (Patient not taking: Reported on 05/28/2023), Disp: , Rfl:    hydrochlorothiazide (HYDRODIURIL) 25 MG tablet, TAKE 1 TABLET(25 MG) BY MOUTH DAILY, Disp: 30 tablet, Rfl: 5   lisinopril (ZESTRIL) 10 MG tablet, Take 1 tablet (10 mg total) by mouth daily., Disp: 90 tablet, Rfl: 1   Melatonin 1 MG CAPS, Take 1 capsule by mouth daily. (Patient not taking: Reported on 05/28/2023), Disp: , Rfl:    Multiple Vitamin (MULTIVITAMIN) capsule, Take 1 capsule by mouth daily., Disp: , Rfl:    Allergies  Allergen Reactions   Penicillins     Does not remember     Review of Systems  Constitutional: Negative.  Negative for activity change and fatigue.  Eyes:  Negative for visual disturbance.  Respiratory: Negative.  Negative for choking, shortness of breath and wheezing.   Cardiovascular: Negative.  Negative for chest pain, palpitations and leg swelling.  Gastrointestinal: Negative.   Endocrine: Negative.  Negative for polydipsia, polyphagia and polyuria.  Musculoskeletal: Negative.   Skin: Negative.   Neurological:  Positive for numbness (left arm (new) and left leg). Negative for dizziness, weakness and headaches.  Psychiatric/Behavioral:  Negative for confusion. The patient is not nervous/anxious.      There were no vitals filed for this visit. There is no height or weight on file to calculate BMI.  Wt Readings from Last 3 Encounters:  06/09/23 232 lb (105.2 kg)  05/28/23 220 lb (99.8 kg)  05/26/23 229 lb (103.9 kg)      Objective:  Physical Exam Vitals reviewed.  Constitutional:      General: She is not in acute distress.    Appearance: Normal appearance. She is well-developed. She is obese.  HENT:     Head: Normocephalic and atraumatic.  Eyes:     Pupils: Pupils are equal, round, and reactive to light.  Cardiovascular:     Rate and Rhythm: Normal rate and regular rhythm.     Pulses: Normal pulses.      Heart sounds: Normal heart sounds. No murmur heard. Pulmonary:     Effort: Pulmonary effort is normal. No respiratory distress.     Breath sounds: Normal breath sounds. No wheezing.  Musculoskeletal:        General: No tenderness. Normal range of motion.  Skin:    General: Skin is warm and dry.     Capillary Refill: Capillary refill takes less than 2 seconds.  Neurological:     General: No focal deficit present.     Mental Status: She is alert and oriented to person, place, and time.     Cranial Nerves: No cranial nerve deficit.     Gait: Gait is intact.     Comments: She has decreased sensation to her left arm and leg  Psychiatric:        Mood and Affect: Mood normal.        Behavior: Behavior normal.        Thought Content: Thought content normal.        Judgment: Judgment normal.         Assessment And Plan:  There are no diagnoses linked to this encounter.  No follow-ups on file.  Patient was given opportunity to ask questions. Patient verbalized understanding of the plan and was able to repeat key elements of the plan. All questions were answered to their satisfaction.    Jeanell Sparrow, FNP, have reviewed all documentation for this visit. The documentation on 12/04/23 for the exam, diagnosis, procedures, and orders are all accurate and complete.   IF YOU HAVE BEEN REFERRED TO A SPECIALIST, IT MAY TAKE 1-2 WEEKS TO SCHEDULE/PROCESS THE REFERRAL. IF YOU HAVE NOT HEARD FROM US/SPECIALIST IN TWO WEEKS, PLEASE GIVE [place] A CALL AT [phone].

## 2023-12-05 ENCOUNTER — Other Ambulatory Visit: Payer: Self-pay | Admitting: Nurse Practitioner

## 2023-12-05 ENCOUNTER — Encounter: Payer: Self-pay | Admitting: Nurse Practitioner

## 2023-12-05 DIAGNOSIS — N644 Mastodynia: Secondary | ICD-10-CM

## 2023-12-05 LAB — TSH: TSH: 1.45 u[IU]/mL (ref 0.450–4.500)

## 2023-12-05 LAB — VITAMIN B12: Vitamin B-12: 486 pg/mL (ref 232–1245)

## 2023-12-07 ENCOUNTER — Encounter: Payer: Self-pay | Admitting: Nurse Practitioner

## 2023-12-15 DIAGNOSIS — H353131 Nonexudative age-related macular degeneration, bilateral, early dry stage: Secondary | ICD-10-CM | POA: Diagnosis not present

## 2023-12-15 DIAGNOSIS — H35723 Serous detachment of retinal pigment epithelium, bilateral: Secondary | ICD-10-CM | POA: Diagnosis not present

## 2023-12-15 DIAGNOSIS — H35363 Drusen (degenerative) of macula, bilateral: Secondary | ICD-10-CM | POA: Diagnosis not present

## 2023-12-16 ENCOUNTER — Encounter: Payer: Self-pay | Admitting: Nurse Practitioner

## 2023-12-16 DIAGNOSIS — N644 Mastodynia: Secondary | ICD-10-CM | POA: Insufficient documentation

## 2023-12-16 DIAGNOSIS — Z2821 Immunization not carried out because of patient refusal: Secondary | ICD-10-CM | POA: Insufficient documentation

## 2023-12-16 DIAGNOSIS — N6311 Unspecified lump in the right breast, upper outer quadrant: Secondary | ICD-10-CM | POA: Insufficient documentation

## 2023-12-16 DIAGNOSIS — R200 Anesthesia of skin: Secondary | ICD-10-CM | POA: Insufficient documentation

## 2023-12-16 DIAGNOSIS — Z1231 Encounter for screening mammogram for malignant neoplasm of breast: Secondary | ICD-10-CM | POA: Insufficient documentation

## 2023-12-17 NOTE — Assessment & Plan Note (Signed)
No abnormality to left leg will check CT scan for any abnormalities due to new onset numbness. I did advise her in the future if has new onset numbness or tingling to go to ER for evaluation particularly due to concerns about stroke symptoms.

## 2023-12-17 NOTE — Assessment & Plan Note (Signed)
Palpable nodule to right breast lateral and small nodule to nipple area at 4 o'clock. Will check diagnostic mammogram

## 2023-12-17 NOTE — Assessment & Plan Note (Signed)
Will check mammogram due to breast tenderness

## 2023-12-23 ENCOUNTER — Encounter: Payer: Self-pay | Admitting: Internal Medicine

## 2023-12-24 ENCOUNTER — Other Ambulatory Visit: Payer: Medicare HMO

## 2023-12-25 ENCOUNTER — Other Ambulatory Visit: Payer: Self-pay | Admitting: Nurse Practitioner

## 2023-12-25 DIAGNOSIS — R202 Paresthesia of skin: Secondary | ICD-10-CM

## 2023-12-30 ENCOUNTER — Encounter: Payer: Self-pay | Admitting: Internal Medicine

## 2023-12-30 ENCOUNTER — Ambulatory Visit (INDEPENDENT_AMBULATORY_CARE_PROVIDER_SITE_OTHER): Payer: Medicare HMO | Admitting: Internal Medicine

## 2023-12-30 ENCOUNTER — Ambulatory Visit
Admission: RE | Admit: 2023-12-30 | Discharge: 2023-12-30 | Disposition: A | Discharge status: Home / Self Care | Payer: Medicare HMO | Source: Ambulatory Visit | Attending: Nurse Practitioner | Admitting: Nurse Practitioner

## 2023-12-30 VITALS — BP 120/80 | HR 57 | Temp 98.1°F

## 2023-12-30 DIAGNOSIS — R200 Anesthesia of skin: Secondary | ICD-10-CM

## 2023-12-30 DIAGNOSIS — Z6837 Body mass index (BMI) 37.0-37.9, adult: Secondary | ICD-10-CM | POA: Diagnosis not present

## 2023-12-30 DIAGNOSIS — N644 Mastodynia: Secondary | ICD-10-CM | POA: Diagnosis not present

## 2023-12-30 DIAGNOSIS — N182 Chronic kidney disease, stage 2 (mild): Secondary | ICD-10-CM | POA: Diagnosis not present

## 2023-12-30 DIAGNOSIS — R92323 Mammographic fibroglandular density, bilateral breasts: Secondary | ICD-10-CM | POA: Diagnosis not present

## 2023-12-30 DIAGNOSIS — E7800 Pure hypercholesterolemia, unspecified: Secondary | ICD-10-CM

## 2023-12-30 DIAGNOSIS — M542 Cervicalgia: Secondary | ICD-10-CM | POA: Diagnosis not present

## 2023-12-30 DIAGNOSIS — F40240 Claustrophobia: Secondary | ICD-10-CM | POA: Diagnosis not present

## 2023-12-30 DIAGNOSIS — R202 Paresthesia of skin: Secondary | ICD-10-CM

## 2023-12-30 DIAGNOSIS — E66812 Obesity, class 2: Secondary | ICD-10-CM | POA: Diagnosis not present

## 2023-12-30 DIAGNOSIS — I129 Hypertensive chronic kidney disease with stage 1 through stage 4 chronic kidney disease, or unspecified chronic kidney disease: Secondary | ICD-10-CM

## 2023-12-30 MED ORDER — DIAZEPAM 2 MG PO TABS: ORAL_TABLET | ORAL | 0 refills | Status: DC

## 2023-12-30 MED ORDER — TIZANIDINE HCL 4 MG PO TABS: 4.0000 mg | ORAL_TABLET | Freq: Every evening | ORAL | 0 refills | Status: DC | PRN

## 2023-12-30 NOTE — Patient Instructions (Addendum)
[facility] Gabapentin - name of medication for nerve pain  Hypertension, Adult Hypertension is another name for high blood pressure. High blood pressure forces your heart to work harder to pump blood. This can cause problems over time. There are two numbers in a blood pressure reading. There is a top number (systolic) over a bottom number (diastolic). It is best to have a blood pressure that is below 120/80. What are the causes? The cause of this condition is not known. Some other conditions can lead to high blood pressure. What increases the risk? Some lifestyle factors can make you more likely to develop high blood pressure: Smoking. Not getting enough exercise or physical activity. Being overweight. Having too much fat, sugar, calories, or salt (sodium) in your diet. Drinking too much alcohol. Other risk factors include: Having any of these conditions: Heart disease. Diabetes. High cholesterol. Kidney disease. Obstructive sleep apnea. Having a family history of high blood pressure and high cholesterol. Age. The risk increases with age. Stress. What are the signs or symptoms? High blood pressure may not cause symptoms. Very high blood pressure (hypertensive crisis) may cause: Headache. Fast or uneven heartbeats (palpitations). Shortness of breath. Nosebleed. Vomiting or feeling like you may vomit (nauseous). Changes in how you see. Very bad chest pain. Feeling dizzy. Seizures. How is this treated? This condition is treated by making healthy lifestyle changes, such as: Eating healthy foods. Exercising more. Drinking less alcohol. Your doctor may prescribe medicine if lifestyle changes do not help enough and if: Your top number is above 130. Your bottom number is above 80. Your personal target blood pressure may vary. Follow these instructions at home: Eating and drinking  If told, follow the DASH eating plan. To follow this plan: Fill one half of your plate  at each meal with fruits and vegetables. Fill one fourth of your plate at each meal with whole grains. Whole grains include whole-wheat pasta, brown rice, and whole-grain bread. Eat or drink low-fat dairy products, such as skim milk or low-fat yogurt. Fill one fourth of your plate at each meal with low-fat (lean) proteins. Low-fat proteins include fish, chicken without skin, eggs, beans, and tofu. Avoid fatty meat, cured and processed meat, or chicken with skin. Avoid pre-made or processed food. Limit the amount of salt in your diet to less than 1,500 mg each day. Do not drink alcohol if: Your doctor tells you not to drink. You are pregnant, may be pregnant, or are planning to become pregnant. If you drink alcohol: Limit how much you have to: 0-1 drink a day for women. 0-2 drinks a day for men. Know how much alcohol is in your drink. In the [place] one drink equals one 12 oz bottle of beer (355 mL), one 5 oz glass of wine (148 mL), or one 1 oz glass of hard liquor (44 mL). Lifestyle  Work with your doctor to stay at a healthy weight or to lose weight. Ask your doctor what the best weight is for you. Get at least 30 minutes of exercise that causes your heart to beat faster (aerobic exercise) most days of the week. This may include walking, swimming, or biking. Get at least 30 minutes of exercise that strengthens your muscles (resistance exercise) at least 3 days a week. This may include lifting weights or doing Pilates. Do not smoke or use any products that contain nicotine or tobacco. If you need help quitting, ask your doctor. Check your blood pressure at home as told by your  doctor. Keep all follow-up visits. Medicines Take over-the-counter and prescription medicines only as told by your doctor. Follow directions carefully. Do not skip doses of blood pressure medicine. The medicine does not work as well if you skip doses. Skipping doses also puts you at risk for problems. Ask your doctor  about side effects or reactions to medicines that you should watch for. Contact a doctor if: You think you are having a reaction to the medicine you are taking. You have headaches that keep coming back. You feel dizzy. You have swelling in your ankles. You have trouble with your vision. Get help right away if: You get a very bad headache. You start to feel mixed up (confused). You feel weak or numb. You feel faint. You have very bad pain in your: Chest. Belly (abdomen). You vomit more than once. You have trouble breathing. These symptoms may be an emergency. Get help right away. Call 911. Do not wait to see if the symptoms will go away. Do not drive yourself to the hospital. Summary Hypertension is another name for high blood pressure. High blood pressure forces your heart to work harder to pump blood. For most people, a normal blood pressure is less than 120/80. Making healthy choices can help lower blood pressure. If your blood pressure does not get lower with healthy choices, you may need to take medicine. This information is not intended to replace advice given to you by your health care provider. Make sure you discuss any questions you have with your health care provider. Document Revised: 08/23/2021 Document Reviewed: 08/23/2021 Elsevier Patient Education  2024 [employer].

## 2023-12-30 NOTE — Progress Notes (Signed)
I,[name], CMA,acting as a Neurosurgeon for Emma Aliment, MD.,have documented all relevant documentation on the behalf of Emma Aliment, MD,as directed by  Emma Aliment, MD while in the presence of Emma Aliment, MD.  Subjective:  Patient ID: [name] , female    DOB: 13-Jan-1946 , 78 y.o.   MRN: [id number]  Chief Complaint  Patient presents with   Hypertension    HPI  Patient presents today for bpc. Patient reports compliance with her meds. Patient denies having chest pain, headaches or sob at this time. Patient is still concerned about the numbness and tingling in her arm and leg. She reports she hasn't heard back from the neurologist.       Past Medical History:  Diagnosis Date   Back pain    Falls    Fibromyalgia    Hypertension    Joint pain    Joint stiffness    Joint swelling    Pure hypercholesterolemia 01/11/2020   Raynaud's disease    age 82's   Sleep apnea    Varicose veins of both lower extremities    Vitamin B12 deficiency    2020   Vitamin D deficiency      Family History  Problem Relation Age of Onset   Hypertension Mother    Hyperthyroidism Mother    Hypertension Father    Prostate cancer Father    Diabetes Brother    Breast cancer Paternal Aunt    Breast cancer Paternal Grandmother    Peripheral vascular disease Other      Current Outpatient Medications:    acetaminophen (TYLENOL) 500 MG tablet, Take 1,000 mg by mouth daily as needed for moderate pain., Disp: , Rfl:    atorvastatin (LIPITOR) 10 MG tablet, TAKE 1 TABLET(10 MG) BY MOUTH DAILY, Disp: 90 tablet, Rfl: 1   Black Pepper-Turmeric (TURMERIC COMPLEX/BLACK PEPPER PO), Take 1 tablet by mouth daily., Disp: , Rfl:    diazepam (VALIUM) 2 MG tablet, One tab po one hour prior to CT scan, repeat if needed, Disp: 2 tablet, Rfl: 0   diclofenac (VOLTAREN) 75 MG EC tablet, Take 1 tablet (75 mg total) by mouth 2 (two) times daily., Disp: 50 tablet, Rfl: 2   Doxylamine Succinate,  Sleep, (SLEEP AID PO), Take 1 tablet by mouth daily. Takes half a tab at bedtime, Disp: , Rfl:    hydrochlorothiazide (HYDRODIURIL) 25 MG tablet, TAKE 1 TABLET(25 MG) BY MOUTH DAILY, Disp: 30 tablet, Rfl: 5   lisinopril (ZESTRIL) 10 MG tablet, Take 1 tablet (10 mg total) by mouth daily., Disp: 90 tablet, Rfl: 1   Melatonin 1 MG CAPS, Take 1 capsule by mouth daily., Disp: , Rfl:    Multiple Vitamin (MULTIVITAMIN) capsule, Take 1 capsule by mouth daily., Disp: , Rfl:    tiZANidine (ZANAFLEX) 4 MG tablet, Take 1 tablet (4 mg total) by mouth at bedtime as needed for muscle spasms., Disp: 30 tablet, Rfl: 0   Allergies  Allergen Reactions   Penicillins     Does not remember     Review of Systems  Constitutional: Negative.   Eyes: Negative.   Respiratory: Negative.    Cardiovascular: Negative.   Gastrointestinal: Negative.   Musculoskeletal:  Positive for neck pain.  Skin: Negative.   Psychiatric/Behavioral: Negative.       Today's Vitals   12/30/23 1030  BP: 120/80  Pulse: (!) 57  Temp: 98.1 F (36.7 C)  TempSrc: Oral  PainSc: 8   PainLoc: Knee  There is no height or weight on file to calculate BMI.  Wt Readings from Last 3 Encounters:  06/09/23 232 lb (105.2 kg)  05/28/23 220 lb (99.8 kg)  05/26/23 229 lb (103.9 kg)    The 10-year ASCVD risk score (Arnett DK, et al., 2019) is: 9.8%   Values used to calculate the score:     Age: 75 years     Sex: Female     Is Non-Hispanic African American: Yes     Diabetic: No     Tobacco smoker: No     Systolic Blood Pressure: 120 mmHg     Is BP treated: Yes     HDL Cholesterol: 46 mg/dL     Total Cholesterol: 137 mg/dL  Objective:  Physical Exam Vitals and nursing note reviewed.  Constitutional:      Appearance: Normal appearance.  HENT:     Head: Normocephalic and atraumatic.  Eyes:     Extraocular Movements: Extraocular movements intact.  Neck:     Comments: Tight trapezius muscles to palpation b/l Cardiovascular:      Rate and Rhythm: Normal rate and regular rhythm.     Heart sounds: Normal heart sounds.  Pulmonary:     Effort: Pulmonary effort is normal.     Breath sounds: Normal breath sounds.  Musculoskeletal:     Cervical back: Normal range of motion. Tenderness present.  Skin:    General: Skin is warm.  Neurological:     General: No focal deficit present.     Mental Status: She is alert.  Psychiatric:        Mood and Affect: Mood normal.        Behavior: Behavior normal.         Assessment And Plan:  Parenchymal renal hypertension, stage 1 through stage 4 or unspecified chronic kidney disease Assessment & Plan: Chronic, controlled.  She is currently on lisinopril and hydrochlorothiazide. She is encouraged to follow low sodium diet. May need to switch to ARB OR add low dose CCB if BP goes above 130/80.    Orders: -     Basic metabolic panel  Chronic kidney disease, stage II (mild) Assessment & Plan: Chronic, she is encouraged to stay well hydrated, avoid NSAIDs and keep BP controlled to prevent progression of CKD.     Pure hypercholesterolemia Assessment & Plan: Chronic, currently on atorvastatin 10mg  daily. She is encouraged to follow heart healthy lifestyle.    Orders: -     Lipid panel -     Lipoprotein A (LPA) -     ALT  Numbness and tingling of left leg Assessment & Plan: She was evaluated by NP for similar sx in late January.  Unfortunately, she cancelled head CT ordered by NP. She is encouraged to keep Neurology appt and to go to ER if sx worsen and/or if weakness develops.    Claustrophobia -     diazePAM; One tab po one hour prior to CT scan, repeat if needed  Dispense: 2 tablet; Refill: 0  Class 2 severe obesity with serious comorbidity and body mass index (BMI) of 37.0 to 37.9 in adult, unspecified obesity type [facility]) Assessment & Plan: She is encouraged to strive for BMI less than 30 to decrease cardiac risk. Advised to aim for at least 150 minutes of exercise per  week.    Cervicalgia -     tiZANidine HCl; Take 1 tablet (4 mg total) by mouth at bedtime as needed for muscle spasms.  Dispense:  30 tablet; Refill: 0    Return in about 4 months (around 04/28/2024) for bpc.  Patient was given opportunity to ask questions. Patient verbalized understanding of the plan and was able to repeat key elements of the plan. All questions were answered to their satisfaction.    I, Emma Aliment, MD, have reviewed all documentation for this visit. The documentation on 12/30/23 for the exam, diagnosis, procedures, and orders are all accurate and complete.   IF YOU HAVE BEEN REFERRED TO A SPECIALIST, IT MAY TAKE 1-2 WEEKS TO SCHEDULE/PROCESS THE REFERRAL. IF YOU HAVE NOT HEARD FROM US/SPECIALIST IN TWO WEEKS, PLEASE GIVE [place] A CALL AT [phone].

## 2023-12-31 LAB — BASIC METABOLIC PANEL
BUN/Creatinine Ratio: 17 (ref 12–28)
BUN: 15 mg/dL (ref 8–27)
CO2: 25 mmol/L (ref 20–29)
Calcium: 9.1 mg/dL (ref 8.7–10.3)
Chloride: 107 mmol/L — ABNORMAL HIGH (ref 96–106)
Creatinine, Ser: 0.89 mg/dL (ref 0.57–1.00)
Glucose: 86 mg/dL (ref 70–99)
Potassium: 4.5 mmol/L (ref 3.5–5.2)
Sodium: 142 mmol/L (ref 134–144)
eGFR: 67 mL/min/{1.73_m2} (ref >59)

## 2023-12-31 LAB — LIPID PANEL
Chol/HDL Ratio: 3 {ratio} (ref 0.0–4.4)
Cholesterol, Total: 137 mg/dL (ref 100–199)
HDL: 46 mg/dL (ref >39)
LDL Chol Calc (NIH): 74 mg/dL (ref 0–99)
Triglycerides: 88 mg/dL (ref 0–149)
VLDL Cholesterol Cal: 17 mg/dL (ref 5–40)

## 2023-12-31 LAB — ALT: ALT: 15 [IU]/L (ref 0–32)

## 2023-12-31 LAB — LIPOPROTEIN A (LPA): Lipoprotein (a): 69.3 nmol/L (ref <75.0)

## 2024-01-03 NOTE — Assessment & Plan Note (Signed)
Chronic, currently on atorvastatin 10mg  daily. She is encouraged to follow heart healthy lifestyle.

## 2024-01-03 NOTE — Assessment & Plan Note (Addendum)
She was evaluated by NP for similar sx in late January.  Unfortunately, she cancelled head CT ordered by NP. She is encouraged to keep Neurology appt and to go to ER if sx worsen and/or if weakness develops.

## 2024-01-03 NOTE — Assessment & Plan Note (Addendum)
Chronic, controlled.  She is currently on lisinopril and hydrochlorothiazide. She is encouraged to follow low sodium diet. May need to switch to ARB OR add low dose CCB if BP goes above 130/80.

## 2024-01-03 NOTE — Assessment & Plan Note (Signed)
 She is encouraged to strive for BMI less than 30 to decrease cardiac risk. Advised to aim for at least 150 minutes of exercise per week.

## 2024-01-03 NOTE — Assessment & Plan Note (Signed)
 Chronic, she is encouraged to stay well hydrated, avoid NSAIDs and keep BP controlled to prevent progression of CKD.

## 2024-01-09 ENCOUNTER — Telehealth: Payer: Self-pay | Admitting: Internal Medicine

## 2024-01-09 NOTE — Telephone Encounter (Signed)
 Not our patient

## 2024-01-09 NOTE — Telephone Encounter (Signed)
Copied from CRM [id number]. Topic: General - Other >> Jan 09, 2024  3:24 PM Santiya F wrote: Reason for CRM: Patient is calling in requesting that [place] call her.

## 2024-01-13 ENCOUNTER — Encounter: Payer: Self-pay | Admitting: Neurology

## 2024-01-16 ENCOUNTER — Other Ambulatory Visit: Payer: Self-pay | Admitting: Internal Medicine

## 2024-01-16 DIAGNOSIS — E7800 Pure hypercholesterolemia, unspecified: Secondary | ICD-10-CM

## 2024-01-21 ENCOUNTER — Encounter: Payer: Medicare HMO | Admitting: Internal Medicine

## 2024-01-28 ENCOUNTER — Ambulatory Visit: Payer: Medicare HMO | Admitting: Neurology

## 2024-02-02 ENCOUNTER — Other Ambulatory Visit: Payer: Self-pay | Admitting: Internal Medicine

## 2024-02-02 DIAGNOSIS — M542 Cervicalgia: Secondary | ICD-10-CM

## 2024-02-16 ENCOUNTER — Ambulatory Visit: Payer: Medicare HMO | Admitting: Neurology

## 2024-02-18 ENCOUNTER — Encounter: Payer: Self-pay | Admitting: Neurology

## 2024-02-18 ENCOUNTER — Ambulatory Visit: Admitting: Neurology

## 2024-02-18 VITALS — BP 144/79 | HR 75 | Ht 67.0 in

## 2024-02-18 DIAGNOSIS — G5712 Meralgia paresthetica, left lower limb: Secondary | ICD-10-CM

## 2024-02-18 DIAGNOSIS — M5412 Radiculopathy, cervical region: Secondary | ICD-10-CM | POA: Diagnosis not present

## 2024-02-18 NOTE — Patient Instructions (Signed)
 You may try over the counter lidocaine ointment/patch for your left thigh  We will refer you for neck physical therapy

## 2024-02-18 NOTE — Progress Notes (Signed)
 [facility] Clinic Note - Initial Visit   Date: 02/18/2024   Saman Giddens MRN: [id number] DOB: 1946-03-27   Dear Arnette Felts, FNP:  Thank you for your kind referral of Arianne Klinge for consultation of left arm and leg numbness. Although her history is well known to you, please allow [place] to reiterate it for the purpose of our medical record. The patient was accompanied to the clinic by self.    Jenavi Beedle is a 78 y.o. right-handed female with hypertension, hyperlipidemia, and fibromyalgia presenting for evaluation of left arm and leg numbness.   IMPRESSION/PLAN: Left cervical radiculopathy with sensory disturbance ?C6-7, exam shows diminished left brachioradialis reflex.    - Start neck PT  - MRI cervical spine can be ordered, if no benefit with PT  2.   Left meralgia paresthetica  - Weight loss is encouraged  - gabepentin declined  - OTC lidocaine ointment  Return to clinic in 4 months  ------------------------------------------------------------- History of present illness: Starting around 2022, she began having numbness and burning involving the left lateral thigh.  It is worse at night when she is laying down.  Symptoms are not bothersome during the daytime, unless she has been sitting for a long time. She has gained 20lb over the past year.   No leg weakness, falls, or imbalance.  She walks unassisted.   For the past 6-8 months, she has numbness involving the left arm over the lateral upper arm that radiates down the forearm and the back of the hand/fingers  It occurs intermittently throughout the day.  She has chronic neck pain.  She had not done any physical therapy.  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  She works as a Scientist, research (physical sciences). Lives alone.  Out-side paper records, electronic medical record, and images have been reviewed where available and summarized as:   NCS/EMG of the left arm 10/26/2019: This is a normal study of the left upper extremity.  In particular, there is no evidence of polyradiculoneuropathy, sensorimotor polyneuropathy, or cervical radiculopathy.   NCS/EMG of the arms 01/21/2023:  Bilateral median mononeuropathies across the wrists, mild on the left and moderate on the right.  Isolated mild left ulnar sensory neuropathy.    Lab Results  Component Value Date   HGBA1C 5.4 08/08/2021   Lab Results  Component Value Date   VITAMINB12 486 12/04/2023   Lab Results  Component Value Date   TSH 1.450 12/04/2023   Lab Results  Component Value Date   ESRSEDRATE 19 11/28/2021    Past Medical History:  Diagnosis Date   Back pain    Falls    Fibromyalgia    Hypertension    Joint pain    Joint stiffness    Joint swelling    Pure hypercholesterolemia 01/11/2020   Raynaud's disease    age 35's   Sleep apnea    Varicose veins of both lower extremities    Vitamin B12 deficiency    2020   Vitamin D deficiency     Past Surgical History:  Procedure Laterality Date   BREAST BIOPSY Right    BREAST EXCISIONAL BIOPSY Right    FOOT FRACTURE SURGERY Right    FRACTURE SURGERY     KNEE SURGERY Left 2007   TUBAL LIGATION       Medications:  Outpatient Encounter Medications as of 02/18/2024  Medication Sig   acetaminophen (TYLENOL) 500 MG tablet Take 1,000  mg by mouth daily as needed for moderate pain.   atorvastatin (LIPITOR) 10 MG tablet TAKE 1 TABLET(10 MG) BY MOUTH DAILY   Black Pepper-Turmeric (TURMERIC COMPLEX/BLACK PEPPER PO) Take 1 tablet by mouth daily.   diazepam (VALIUM) 2 MG tablet One tab po one hour prior to CT scan, repeat if needed   diclofenac Sodium (VOLTAREN) 1 % GEL Apply topically 4 (four) times daily.   Doxylamine Succinate, Sleep, (SLEEP AID PO) Take 1 tablet by mouth daily. Takes half a tab at bedtime   hydrochlorothiazide (HYDRODIURIL) 25 MG tablet TAKE 1 TABLET(25 MG) BY MOUTH DAILY   lisinopril  (ZESTRIL) 10 MG tablet Take 1 tablet (10 mg total) by mouth daily.   Melatonin 1 MG CAPS Take 1 capsule by mouth daily.   Multiple Vitamin (MULTIVITAMIN) capsule Take 1 capsule by mouth daily.   tiZANidine (ZANAFLEX) 4 MG tablet TAKE 1 TABLET(4 MG) BY MOUTH AT BEDTIME AS NEEDED FOR MUSCLE SPASMS   [DISCONTINUED] diclofenac (VOLTAREN) 75 MG EC tablet Take 1 tablet (75 mg total) by mouth 2 (two) times daily. (Patient not taking: Reported on 02/18/2024)   No facility-administered encounter medications on file as of 02/18/2024.    Allergies:  Allergies  Allergen Reactions   Penicillins     Does not remember    Family History: Family History  Problem Relation Age of Onset   Hypertension Mother    Hyperthyroidism Mother    Hypertension Father    Prostate cancer Father    Diabetes Brother    Breast cancer Paternal Aunt    Breast cancer Paternal Grandmother    Peripheral vascular disease Other     Social History: Social History   Tobacco Use   Smoking status: Former   Smokeless tobacco: Never   Tobacco comments:    quit at age 55,only smoked for 1-2 years  Vaping Use   Vaping status: Never Used  Substance Use Topics   Alcohol use: Yes    Comment: 2 drinks weekly   Drug use: No   Social History   Social History Narrative   Are you right handed or left handed? Right Handed   Are you currently employed ? 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 Yes    Who lives with you?    What type of home do you live in: 1 story or 2 story?  Lives in a 3 story home         Vital Signs:  BP (!) 144/79   Pulse 75   Ht 5\' 7"  (1.702 m)   SpO2 98%   BMI 36.34 kg/m   Neurological Exam: MENTAL STATUS including orientation to time, place, person, recent and remote memory, attention span and concentration, language, and fund of knowledge is normal.  Speech is not dysarthric.  CRANIAL NERVES: II:  No visual field defects.     III-IV-VI:  Pupils equal round and reactive to light.  Normal conjugate, extra-ocular eye movements in all directions of gaze.  No nystagmus.  No ptosis.   V:  Normal facial sensation.    VII:  Normal facial symmetry and movements.   VIII:  Normal hearing and vestibular function.   IX-X:  Normal palatal movement.   XI:  Normal shoulder shrug and head rotation.   XII:  Normal tongue strength and range of motion, no deviation or fasciculation.  MOTOR:  No atrophy, fasciculations or abnormal movements.  No  pronator drift.   Upper Extremity:  Right  Left  Deltoid  5/5   5/5   Biceps  5/5   5/5   Triceps  5/5   5/5   Wrist extensors  5/5   5/5   Wrist flexors  5/5   5/5   Finger extensors  5/5   5/5   Finger flexors  5/5   5/5   Dorsal interossei  5/5   5/5   Abductor pollicis  5/5   5/5   Tone (Ashworth scale)  0  0   Lower Extremity:  Right  Left  Hip flexors  5/5   5/5   Knee flexors  5/5   5/5   Knee extensors  5/5   5/5   Dorsiflexors  5/5   5/5   Plantarflexors  5/5   5/5   Toe extensors  5/5   5/5   Toe flexors  5/5   5/5   Tone (Ashworth scale)  0  0   MSRs:                                           Right        Left brachioradialis 2+  tr  biceps 2+  2+  triceps 2+  2+  patellar 2+  2+  ankle jerk 2+  2+  Hoffman no  no  plantar response down  down   SENSORY:  Reduced pin prick and temperature over the left LFCN distribution, intact in the arms and right leg.  Romberg's sign absent.   COORDINATION/GAIT: Normal finger-to- nose-finger.  Intact rapid alternating movements bilaterally.  Gait narrow based and stable. Tandem and stressed gait intact.    Thank you for allowing me to participate in patient's care.  If I can answer any additional questions, I would be pleased to do so.    Sincerely,    Carlito Bogert K. Allena Katz, DO

## 2024-02-20 ENCOUNTER — Other Ambulatory Visit (HOSPITAL_BASED_OUTPATIENT_CLINIC_OR_DEPARTMENT_OTHER)

## 2024-02-20 ENCOUNTER — Encounter (HOSPITAL_BASED_OUTPATIENT_CLINIC_OR_DEPARTMENT_OTHER): Payer: Self-pay

## 2024-03-08 ENCOUNTER — Ambulatory Visit: Admitting: Physical Therapy

## 2024-03-11 ENCOUNTER — Other Ambulatory Visit: Payer: Self-pay

## 2024-03-11 ENCOUNTER — Ambulatory Visit: Attending: Neurology | Admitting: Physical Therapy

## 2024-03-11 ENCOUNTER — Other Ambulatory Visit: Payer: Self-pay | Admitting: Internal Medicine

## 2024-03-11 ENCOUNTER — Encounter: Payer: Self-pay | Admitting: Physical Therapy

## 2024-03-11 DIAGNOSIS — M5412 Radiculopathy, cervical region: Secondary | ICD-10-CM | POA: Insufficient documentation

## 2024-03-11 DIAGNOSIS — M542 Cervicalgia: Secondary | ICD-10-CM

## 2024-03-11 DIAGNOSIS — M6281 Muscle weakness (generalized): Secondary | ICD-10-CM | POA: Diagnosis not present

## 2024-03-11 DIAGNOSIS — G5712 Meralgia paresthetica, left lower limb: Secondary | ICD-10-CM | POA: Insufficient documentation

## 2024-03-11 DIAGNOSIS — R293 Abnormal posture: Secondary | ICD-10-CM | POA: Insufficient documentation

## 2024-03-11 NOTE — Therapy (Signed)
 OUTPATIENT PHYSICAL THERAPY CERVICAL EVALUATION   Patient Name: [name] MRN: [id number] DOB:[birth year], 78 y.o., female Today's Date: 03/11/2024  END OF SESSION:  PT End of Session - 03/11/24 1226     Visit Number 1    Number of Visits 13    Date for PT Re-Evaluation 04/23/24    Authorization Type [employer]    Progress Note Due on Visit 10    PT Start Time 1018    PT Stop Time 1102    PT Time Calculation (min) 44 min    Activity Tolerance Patient tolerated treatment well    Behavior During Therapy WFL for tasks assessed/performed             Past Medical History:  Diagnosis Date   Back pain    Falls    Fibromyalgia    Hypertension    Joint pain    Joint stiffness    Joint swelling    Pure hypercholesterolemia 01/11/2020   Raynaud's disease    age 38's   Sleep apnea    Varicose veins of both lower extremities    Vitamin B12 deficiency    2020   Vitamin D  deficiency    Past Surgical History:  Procedure Laterality Date   BREAST BIOPSY Right    BREAST EXCISIONAL BIOPSY Right    FOOT FRACTURE SURGERY Right    FRACTURE SURGERY     KNEE SURGERY Left 2007   TUBAL LIGATION     Patient Active Problem List   Diagnosis Date Noted   Numbness and tingling of left leg 12/16/2023   Numbness and tingling in left arm 12/16/2023   Breast tenderness in female 12/16/2023   COVID-19 vaccination declined 12/16/2023   Mass of upper outer quadrant of right breast 12/16/2023   Encounter for screening mammogram for malignant neoplasm of breast 12/16/2023   Claudication of lower extremity (HCC) 09/12/2023   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   Chronic kidney disease, stage II (mild) 08/08/2021   Benign hypertension with CKD (chronic kidney disease) stage III (HCC) 08/08/2021   Vitamin D  deficiency 05/29/2020   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   Class 2 severe obesity with serious comorbidity and body mass  index (BMI) of 37.0 to 37.9 in adult (HCC) 05/29/2020   Blurred vision 04/19/2020   Acute nonintractable headache 04/19/2020   Essential hypertension 01/11/2020   Pure hypercholesterolemia 01/11/2020   Snorings 01/22/2018   Hypersomnia with sleep apnea 01/22/2018   Nocturia more than twice per night 01/22/2018   Obesity with body mass index (BMI) of 35.0 to 39.9 without comorbidity 01/22/2018   Varicose veins of bilateral lower extremities with other complications 06/16/2017   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  Have neck pain and tight, knotted area on L side of neck.   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 Yes: NPRS scale: varies; today 3/10 Pain location: L shoulder, radiating into L arm Pain description: burning, numbness Aggravating factors: worse in bed at night, later in the day Relieving factors: nothing right now  PRECAUTIONS: Other: numbness in L lateral thigh preceding the LUE numbness  RED FLAGS: None     WEIGHT BEARING RESTRICTIONS: No  FALLS:  Has patient fallen in last 6 months? 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  DIAGNOSTIC FINDINGS:  NCS/EMG of the arms 01/21/2023:  Bilateral median mononeuropathies across the wrists, mild on the left and moderate on the right.  Isolated mild left ulnar sensory neuropathy  PATIENT SURVEYS:  NDI 12/50 (24%)  COGNITION: Overall cognitive status: Within functional limits for tasks assessed  SENSATION: Light touch: more intense sensation and "activating the nerve pain" on LUE  POSTURE: rounded shoulders and forward head  PALPATION: Pt tender to palpation along L Upper traps, rhomboids, levator; very tender to palpation along L occiput/subocciput area.  Noted trigger point in L upper/mid trap, which radiates pain into L UE.  CERVICAL ROM:   Active ROM A/PROM (deg) eval  Flexion 48  Extension 18 stretch + radiating into L UE  Right lateral flexion 10 tightness on L  Left lateral flexion 20 crepitus  Right rotation 44 tightness in L neck  Left rotation 36 crepitus   (Blank rows = not tested)  UPPER EXTREMITY ROM:  Active ROM Right eval Left eval  Shoulder flexion  130  Shoulder extension    Shoulder abduction  130  Shoulder adduction    Shoulder extension    Shoulder internal rotation    Shoulder external rotation    Elbow flexion    Elbow extension    Wrist flexion    Wrist extension    Wrist ulnar deviation    Wrist radial deviation    Wrist pronation    Wrist supination     (Blank rows = not tested)  UPPER EXTREMITY MMT:  MMT Right eval Left eval  Shoulder flexion 4+ 4 pain  Shoulder extension    Shoulder abduction 5 4 pain  Shoulder adduction     Shoulder extension    Shoulder internal rotation  4Limited in flex int rot  Shoulder external rotation  4  Middle trapezius    Lower trapezius    Elbow flexion  5  Elbow extension  5  Wrist flexion  5  Wrist extension  5  Wrist ulnar deviation    Wrist radial deviation    Wrist pronation    Wrist supination    Grip strength 14# 20#   (Blank rows = not tested)    TREATMENT DATE: 03/11/2024  PATIENT EDUCATION:  Education details: PT eval results, POC, initial HEP Person educated: Patient Education method: Explanation, Demonstration, and Handouts Education comprehension: verbalized understanding and returned demonstration  HOME EXERCISE PROGRAM: Access Code: [email or web address] URL: [email or web address] Date: 03/11/2024 Prepared by: [facility] - Outpatient  Rehab - [facility]  Exercises - Seated Passive Cervical Retraction  - 1-2 x daily - 7 x weekly - 2 sets - 10 reps  Patient Education - Trigger Point Dry Needling  ASSESSMENT:  CLINICAL IMPRESSION:  Patient is a 78 y/o F presenting to OPPT with c/o neck pain, with burning, tingling and numbness into the LUE for the past 6 months.  She has hx of R CTR and R thumb arthritis, which she notes she is experiencing some of the same arthritis symptoms in L thumb.  She notes no precipitating factors or movements that exacerbate pain, but throughout the eval, she does report increased numbness radiating distally into LUE with sitting position and neck motion.  She presents with pain, decreased neck flexibility, decreased shoulder strength on LUE, decreased functional use, abnormal posture.  She is tender to palpation through L upper traps and cervical musculature, especially into L occiput area.    Patient was educated on gentle cervical HEP for neck retraction, which did not reproduce symptoms  and reported/demo understanding.  She would benefit from skilled PT services  to address aforementioned impairments in order to optimize level of function.   OBJECTIVE IMPAIRMENTS: decreased ROM, decreased strength, impaired flexibility, impaired sensation, impaired UE functional use, postural dysfunction, and pain.   ACTIVITY LIMITATIONS: carrying, dressing, reach over head, hygiene/grooming, and caring for others  PARTICIPATION LIMITATIONS: meal prep, cleaning, laundry, community activity, and yard work  PERSONAL FACTORS: 3+ comorbidities: See PMH above  are also affecting patient's functional outcome.   REHAB POTENTIAL: Good  CLINICAL DECISION MAKING: Stable/uncomplicated  EVALUATION COMPLEXITY: Low  GOALS: Goals reviewed with patient? Yes  SHORT TERM GOALS: Target date: 04/09/2024  Patient to be independent with initial HEP. Baseline: HEP initiated Goal status: INITIAL Pt to report neck pain decrease by 50%, for improved functional mobility and ADLs. Baseline:  3/10 today; but pt reports typically between 5-10/10 Goal status:  INITIAL   LONG TERM GOALS: Target date: 04/23/2024  Patient to be independent with advanced HEP. Baseline: Not yet initiated  Goal status: INITIAL  Patient to demonstrate LUE strength >/=4+/5.  Baseline: See above Goal status: INITIAL  Patient to demonstrate neck ROM WFL and without pain limiting.  Baseline: see above Goal status: INITIAL  Patient to report and demonstrate improved head, neck, and shoulder posture at rest and with activity.  Baseline:   Goal status: INITIAL  Patient to score less than or equal to 18% on NDI for improved neck pain/less daily limitations.  Baseline: 24% Goal status: INITIAL   PLAN:  PT FREQUENCY: 2x/week  PT DURATION: 6 weeks plus eval visit  PLANNED INTERVENTIONS: 97110-Therapeutic exercises, 97530- Therapeutic activity, 97112- Neuromuscular re-education, 97535- Self Care, 78469- Manual therapy,  Patient/Family education, Taping, Dry Needling, Joint mobilization, and Moist heat  PLAN FOR NEXT SESSION: Would pt be appropriate for dry needling?  Review and progress HEP; focus on exercises to lessen radicular symptoms; postural strengthening, shoulder strength, neck ROM   Jeaninne Lodico W., PT 03/11/2024, 12:27 PM  [facility] at [facility] [address] [place], [address] [place], [place] [address] Phone # [phone] Fax # [phone]

## 2024-03-12 NOTE — Therapy (Signed)
 OUTPATIENT PHYSICAL THERAPY CERVICAL TREATMENT   Patient Name: [name] MRN: [id number] DOB:[birth year], 79 y.o., female Today's Date: 03/15/2024  END OF SESSION:  PT End of Session - 03/15/24 1012     Visit Number 2    Number of Visits 13    Date for PT Re-Evaluation 04/23/24    Authorization Type [employer] Medicare    Progress Note Due on Visit 10    PT Start Time 0932    PT Stop Time 1011    PT Time Calculation (min) 39 min    Activity Tolerance Patient tolerated treatment well    Behavior During Therapy [facility] for tasks assessed/performed              Past Medical History:  Diagnosis Date   Back pain    Falls    Fibromyalgia    Hypertension    Joint pain    Joint stiffness    Joint swelling    Pure hypercholesterolemia 01/11/2020   Raynaud's disease    age 69's   Sleep apnea    Varicose veins of both lower extremities    Vitamin B12 deficiency    2020   Vitamin D  deficiency    Past Surgical History:  Procedure Laterality Date   BREAST BIOPSY Right    BREAST EXCISIONAL BIOPSY Right    FOOT FRACTURE SURGERY Right    FRACTURE SURGERY     KNEE SURGERY Left 2007   TUBAL LIGATION     Patient Active Problem List   Diagnosis Date Noted   Numbness and tingling of left leg 12/16/2023   Numbness and tingling in left arm 12/16/2023   Breast tenderness in female 12/16/2023   COVID-19 vaccination declined 12/16/2023   Mass of upper outer quadrant of right breast 12/16/2023   Encounter for screening mammogram for malignant neoplasm of breast 12/16/2023   Claudication of lower extremity (HCC) 09/12/2023   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   Chronic kidney disease, stage II (mild) 08/08/2021   Benign hypertension with CKD (chronic kidney disease) stage III (HCC) 08/08/2021   Vitamin D  deficiency 05/29/2020   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   Class 2 severe obesity with serious comorbidity and body mass  index (BMI) of 37.0 to 37.9 in adult (HCC) 05/29/2020   Blurred vision 04/19/2020   Acute nonintractable headache 04/19/2020   Essential hypertension 01/11/2020   Pure hypercholesterolemia 01/11/2020   Snorings 01/22/2018   Hypersomnia with sleep apnea 01/22/2018   Nocturia more than twice per night 01/22/2018   Obesity with body mass index (BMI) of 35.0 to 39.9 without comorbidity 01/22/2018   Varicose veins of bilateral lower extremities with other complications 06/16/2017   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 Yes: NPRS scale: "difficult to rate. It changes." Pain location: L shoulder, radiating into L arm Pain description: burning, numbness Aggravating factors: worse in bed at night, later in the day Relieving factors: nothing right now  PRECAUTIONS: Other: numbness in L lateral thigh preceding the LUE numbness  RED FLAGS: None     WEIGHT BEARING RESTRICTIONS: No  FALLS:  Has patient fallen in last 6 months? 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 Encouraged 50% effort but pt reports remaining sx     STM and manual TPR  Significant soft tissue restriction in L>R UT, scalenes, cervical paraspinals  Skilled palpation and monitoring of soft tissues during TPDN. Lead pt through deep breathing throughout for max relaxation  Large palpable trigger pt in L UT.   Shoulder circles fwd/back 10x Shoulder shrugs 10x  Tolerated well        Trigger Point Dry Needling  Initial Treatment: Pt instructed on Dry Needling rational, procedures, and possible side effects. Pt instructed to expect mild to moderate muscle soreness later in the day and/or into the next day.  Pt instructed in methods to reduce muscle soreness. Pt instructed to continue prescribed HEP. Patient was educated on signs and symptoms of infection and other risk factors and advised to seek medical attention should they occur.  Patient verbalized understanding of these instructions and education.   Patient Verbal Consent Given: Yes Education Handout Provided: Previously Provided Muscles Treated: L UT using standard technique  Electrical Stimulation Performed: No Treatment Response/Outcome: pt tolerated well; reports "it feels better"   HOME EXERCISE PROGRAM Last updated: 03/15/24 Access Code: [email or web address] URL: [email or web address] Date: 03/15/2024 Prepared by: [facility] - Outpatient  Rehab - [facility]  Exercises - Seated  Shoulder Shrugs  - 1 x daily - 5 x weekly - 2 sets - 10 reps - Seated Shoulder Shrug Circles AROM Backward  - 1 x daily - 5 x weekly - 2 sets - 10 reps - Seated Shoulder Shrug Circles AROM Forward  - 1 x daily - 5 x weekly - 2 sets - 10 reps  Patient Education - Trigger Point Dry Needling   PATIENT EDUCATION: Education details: edu on cervical radiculopathy, edu and answered questions on DN, HEP update  Person educated: Patient Education method: Explanation, Demonstration, Tactile cues, Verbal cues, and Handouts Education comprehension: verbalized understanding and returned demonstration     Note: Objective measures were completed at Evaluation unless otherwise noted.  DIAGNOSTIC FINDINGS:  NCS/EMG of the arms 01/21/2023:  Bilateral median mononeuropathies across the wrists, mild on the left and moderate on the right.  Isolated mild left ulnar sensory neuropathy  PATIENT SURVEYS:  NDI 12/50 (24%)  COGNITION: Overall cognitive status: Within functional limits for tasks assessed  SENSATION: Light touch: more intense sensation and "activating the nerve pain" on LUE  POSTURE: rounded shoulders and forward head  PALPATION: Pt tender to palpation along L Upper traps, rhomboids, levator; very tender to palpation along L occiput/subocciput area.  Noted trigger point in L upper/mid trap, which radiates pain into L UE.  CERVICAL ROM:   Active ROM A/PROM (deg) eval  Flexion 48  Extension 18 stretch + radiating into L UE  Right lateral flexion 10 tightness on L  Left lateral flexion 20 crepitus  Right rotation 44 tightness in L neck  Left rotation 36 crepitus   (Blank rows = not tested)  UPPER EXTREMITY ROM:  Active ROM Right eval Left eval  Shoulder flexion  130  Shoulder extension    Shoulder abduction  130  Shoulder adduction    Shoulder extension    Shoulder internal rotation    Shoulder external rotation    Elbow flexion    Elbow extension    Wrist flexion     Wrist extension    Wrist ulnar deviation    Wrist radial deviation    Wrist pronation    Wrist supination     (Blank rows = not tested)  UPPER EXTREMITY MMT:  MMT Right eval Left eval  Shoulder flexion 4+ 4 pain  Shoulder extension    Shoulder abduction 5 4 pain  Shoulder adduction    Shoulder extension    Shoulder internal rotation  4Limited in flex int rot  Shoulder external rotation  4  Middle trapezius    Lower trapezius    Elbow flexion  5  Elbow extension  5  Wrist flexion  5  Wrist extension  5  Wrist ulnar deviation    Wrist radial deviation    Wrist pronation    Wrist supination    Grip strength 14# 20#   (Blank rows = not tested)    TREATMENT DATE: 03/11/2024                                                                                                                                 PATIENT EDUCATION:  Education details: PT eval results, POC, initial HEP Person educated: Patient Education method: Explanation, Demonstration, and Handouts Education comprehension: verbalized understanding and returned demonstration  HOME EXERCISE PROGRAM: Access Code: [email or web address] URL: [email or web address] Date: 03/11/2024 Prepared by: [facility] - Outpatient  Rehab - [facility]  Exercises - Seated Passive Cervical Retraction  - 1-2 x daily - 7 x weekly - 2 sets - 10 reps  Patient Education - Trigger Point Dry Needling  ASSESSMENT:  CLINICAL IMPRESSION:  Patient arrived to session without new complaints. Reviewed HEP which was not well tolerated, thus advised to discontinue at home. Patient did report relief of radicular sx with distraction, thus this  may be a helpful modality in the future. Soft tissue restriction and palpable trigger pts evident over L cervical musculature. After receiving verbal consent, pt tolerated DN to L UT. Tolerated this well and reported benefit. Reported understanding of all edu and HEP update provided today. No  complaints upon leaving.   OBJECTIVE IMPAIRMENTS: decreased ROM, decreased strength, impaired flexibility, impaired sensation, impaired UE functional use, postural dysfunction, and pain.   ACTIVITY LIMITATIONS: carrying, dressing, reach over head, hygiene/grooming, and caring for others  PARTICIPATION LIMITATIONS: meal prep, cleaning, laundry, community activity, and yard work  PERSONAL FACTORS: 3+ comorbidities: See PMH above  are also affecting patient's functional outcome.   REHAB POTENTIAL: Good  CLINICAL DECISION MAKING: Stable/uncomplicated  EVALUATION COMPLEXITY: Low  GOALS: Goals reviewed with patient? Yes  SHORT TERM GOALS: Target date: 04/09/2024  Patient to be independent with initial HEP. Baseline: HEP initiated Goal status: IN PROGRESS Pt to report neck pain decrease by 50%, for improved functional mobility and ADLs. Baseline:  3/10 today; but pt reports typically between 5-10/10 Goal status:  INITIAL   LONG TERM GOALS: Target date: 04/23/2024  Patient to be independent with advanced HEP. Baseline: Not yet initiated  Goal status: IN PROGRESS  Patient to demonstrate LUE strength >/=4+/5.  Baseline: See above Goal status: IN PROGRESS  Patient to demonstrate neck ROM WFL and without pain limiting.  Baseline: see above Goal status: IN PROGRESS  Patient to report and demonstrate improved head, neck, and shoulder posture at rest and with activity.  Baseline:   Goal status: IN PROGRESS  Patient to score less than or equal to 18% on NDI for improved neck pain/less daily limitations.  Baseline: 24% Goal status: IN PROGRESS   PLAN:  PT FREQUENCY: 2x/week  PT DURATION: 6 weeks plus eval visit  PLANNED INTERVENTIONS: 97110-Therapeutic exercises, 97530- Therapeutic activity, 97112- Neuromuscular re-education, 97535- Self Care, [id number]- Manual therapy, Patient/Family education, Taping, Dry Needling, Joint mobilization, and Moist heat  PLAN FOR NEXT SESSION: how  was DN? Review and progress HEP; focus on exercises to lessen radicular symptoms; postural strengthening, shoulder strength, neck ROM    Thaddeus Filippo, [place], DPT 03/15/24 10:17 AM  [facility] Outpatient Rehab at [facility] [address], [address] [place], [place] [address] Phone # [phone] Fax # [phone]

## 2024-03-15 ENCOUNTER — Encounter: Payer: Self-pay | Admitting: Physical Therapy

## 2024-03-15 ENCOUNTER — Ambulatory Visit: Admitting: Physical Therapy

## 2024-03-15 DIAGNOSIS — R293 Abnormal posture: Secondary | ICD-10-CM

## 2024-03-15 DIAGNOSIS — M542 Cervicalgia: Secondary | ICD-10-CM | POA: Diagnosis not present

## 2024-03-15 DIAGNOSIS — G5712 Meralgia paresthetica, left lower limb: Secondary | ICD-10-CM | POA: Diagnosis not present

## 2024-03-15 DIAGNOSIS — M6281 Muscle weakness (generalized): Secondary | ICD-10-CM

## 2024-03-15 DIAGNOSIS — M5412 Radiculopathy, cervical region: Secondary | ICD-10-CM | POA: Diagnosis not present

## 2024-03-19 ENCOUNTER — Other Ambulatory Visit: Payer: Self-pay | Admitting: Family Medicine

## 2024-03-19 DIAGNOSIS — E7800 Pure hypercholesterolemia, unspecified: Secondary | ICD-10-CM

## 2024-03-19 DIAGNOSIS — N183 Chronic kidney disease, stage 3 unspecified: Secondary | ICD-10-CM

## 2024-03-22 ENCOUNTER — Ambulatory Visit: Attending: Neurology | Admitting: Physical Therapy

## 2024-03-22 DIAGNOSIS — R293 Abnormal posture: Secondary | ICD-10-CM | POA: Insufficient documentation

## 2024-03-22 DIAGNOSIS — M542 Cervicalgia: Secondary | ICD-10-CM | POA: Insufficient documentation

## 2024-03-22 DIAGNOSIS — M6281 Muscle weakness (generalized): Secondary | ICD-10-CM | POA: Insufficient documentation

## 2024-03-24 NOTE — Therapy (Signed)
 OUTPATIENT PHYSICAL THERAPY CERVICAL TREATMENT   Patient Name: [name] MRN: [id number] DOB:Aug 25, 1946, 78 y.o., female Today's Date: 03/25/2024  END OF SESSION:  PT End of Session - 03/25/24 1057     Visit Number 3    Number of Visits 13    Date for PT Re-Evaluation 04/23/24    Authorization Type [employer]    Progress Note Due on Visit 10    PT Start Time 1017    PT Stop Time 1100    PT Time Calculation (min) 43 min    Activity Tolerance Patient tolerated treatment well    Behavior During Therapy WFL for tasks assessed/performed               Past Medical History:  Diagnosis Date   Back pain    Falls    Fibromyalgia    Hypertension    Joint pain    Joint stiffness    Joint swelling    Pure hypercholesterolemia 01/11/2020   Raynaud's disease    age 89's   Sleep apnea    Varicose veins of both lower extremities    Vitamin B12 deficiency    2020   Vitamin D  deficiency    Past Surgical History:  Procedure Laterality Date   BREAST BIOPSY Right    BREAST EXCISIONAL BIOPSY Right    FOOT FRACTURE SURGERY Right    FRACTURE SURGERY     KNEE SURGERY Left 2007   TUBAL LIGATION     Patient Active Problem List   Diagnosis Date Noted   Numbness and tingling of left leg 12/16/2023   Numbness and tingling in left arm 12/16/2023   Breast tenderness in female 12/16/2023   COVID-19 vaccination declined 12/16/2023   Mass of upper outer quadrant of right breast 12/16/2023   Encounter for screening mammogram for malignant neoplasm of breast 12/16/2023   Claudication of lower extremity (HCC) 09/12/2023   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   Chronic kidney disease, stage II (mild) 08/08/2021   Benign hypertension with CKD (chronic kidney disease) stage III (HCC) 08/08/2021   Vitamin D  deficiency 05/29/2020   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   Class 2 severe obesity with serious comorbidity and body mass  index (BMI) of 37.0 to 37.9 in adult (HCC) 05/29/2020   Blurred vision 04/19/2020   Acute nonintractable headache 04/19/2020   Essential hypertension 01/11/2020   Pure hypercholesterolemia 01/11/2020   Snorings 01/22/2018   Hypersomnia with sleep apnea 01/22/2018   Nocturia more than twice per night 01/22/2018   Obesity with body mass index (BMI) of 35.0 to 39.9 without comorbidity 01/22/2018   Varicose veins of bilateral lower extremities with other complications 06/16/2017   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 I am not having as many episodes." Did not get a massage since last session. Denies any soreness after DN.     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 Yes: NPRS scale: "5-6/10" Pain location: L shoulder, radiating into L arm Pain description: burning, numbness Aggravating factors: worse in bed at night, later in the day Relieving factors: nothing right now  PRECAUTIONS: Other: numbness in L lateral thigh preceding the LUE numbness  RED FLAGS: None     WEIGHT BEARING RESTRICTIONS: No  FALLS:  Has patient fallen in last 6 months? 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 Good form but pt reporting development of muscle knot on L sid eof neck      Supine B horizontal ABD red TB 2x10 Cues for gentle scap retraction while not straining cervical musculature. Pt reports this "was more effective"  Supine B shoulder ER red TB 2x10 Cues for gentle scap retraction while not straining cervical musculature. Tolerated well  gentle cervical manual distraction  STM and manual TPR  No change in sx with distraction. Report of relief of L cervical parapsinal pain/tightness with STM  skilled palpation and monitoring of soft tissues during TPDN. Soft tissue restriction and palpable trigger pts in L UT        Trigger Point Dry Needling  Subsequent Treatment: Instructions provided previously at initial dry needling treatment.   Patient Verbal Consent Given: Yes Education Handout Provided: Previously Provided Muscles Treated: L UT using standard technique  Electrical Stimulation Performed: No Treatment Response/Outcome: pt tolerated well; reports some muscle soreness and "I just feel better"   HOME EXERCISE PROGRAM Access Code: [email or web address] URL: [email or web address] Date: 03/25/2024 Prepared by: [facility] - Outpatient  Rehab - [facility]  Exercises - Seated Shoulder Shrugs  - 1 x daily - 5 x weekly - 2 sets - 10 reps - Seated Shoulder Shrug Circles AROM Backward  - 1 x daily - 5 x weekly - 2 sets - 10 reps - Seated Shoulder Shrug Circles AROM Forward  - 1 x daily - 5 x weekly - 2 sets - 10 reps - Supine  Shoulder External Rotation with Resistance  - 1 x daily - 5 x weekly - 2 sets - 10 reps - Supine Shoulder Horizontal Abduction with Resistance  - 1 x daily - 5 x weekly - 2 sets - 10 reps  Patient Education - Trigger Point Dry Needling   PATIENT EDUCATION: Education details: HEP update, reminded pt to keep moving and use heat to DN area Person educated: Patient Education method: Explanation, Demonstration, Tactile cues, Verbal cues, and Handouts Education comprehension: verbalized understanding and returned demonstration     Note: Objective measures were completed at Evaluation unless otherwise noted.  DIAGNOSTIC FINDINGS:  NCS/EMG of the arms 01/21/2023:  Bilateral median mononeuropathies across the wrists, mild  on the left and moderate on the right.  Isolated mild left ulnar sensory neuropathy  PATIENT SURVEYS:  NDI 12/50 (24%)  COGNITION: Overall cognitive status: Within functional limits for tasks assessed  SENSATION: Light touch: more intense sensation and "activating the nerve pain" on LUE  POSTURE: rounded shoulders and forward head  PALPATION: Pt tender to palpation along L Upper traps, rhomboids, levator; very tender to palpation along L occiput/subocciput area.  Noted trigger point in L upper/mid trap, which radiates pain into L UE.  CERVICAL ROM:   Active ROM A/PROM (deg) eval  Flexion 48  Extension 18 stretch + radiating into L UE  Right lateral flexion 10 tightness on L  Left lateral flexion 20 crepitus  Right rotation 44 tightness in L neck  Left rotation 36 crepitus   (Blank rows = not tested)  UPPER EXTREMITY ROM:  Active ROM Right eval Left eval  Shoulder flexion  130  Shoulder extension    Shoulder abduction  130  Shoulder adduction    Shoulder extension    Shoulder internal rotation    Shoulder external rotation    Elbow flexion    Elbow extension    Wrist flexion    Wrist extension    Wrist ulnar deviation    Wrist radial deviation     Wrist pronation    Wrist supination     (Blank rows = not tested)  UPPER EXTREMITY MMT:  MMT Right eval Left eval  Shoulder flexion 4+ 4 pain  Shoulder extension    Shoulder abduction 5 4 pain  Shoulder adduction    Shoulder extension    Shoulder internal rotation  4Limited in flex int rot  Shoulder external rotation  4  Middle trapezius    Lower trapezius    Elbow flexion  5  Elbow extension  5  Wrist flexion  5  Wrist extension  5  Wrist ulnar deviation    Wrist radial deviation    Wrist pronation    Wrist supination    Grip strength 14# 20#   (Blank rows = not tested)    TREATMENT DATE: 03/11/2024                                                                                                                                 PATIENT EDUCATION:  Education details: PT eval results, POC, initial HEP Person educated: Patient Education method: Explanation, Demonstration, and Handouts Education comprehension: verbalized understanding and returned demonstration  HOME EXERCISE PROGRAM: Access Code: [email or web address] URL: [email or web address] Date: 03/11/2024 Prepared by: [facility] - Outpatient  Rehab - [facility]  Exercises - Seated Passive Cervical Retraction  - 1-2 x daily - 7 x weekly - 2 sets - 10 reps  Patient Education - Trigger Point Dry Needling  ASSESSMENT:  CLINICAL IMPRESSION: Patient arrived to session with report of improvement in neck pain sine last session. Denies side effects from DN. Worked on periscapular strengthening  with modifications for max tolerance and updated into HEP. Patient requested DN again today, thus performed to L UT. Patient with several twitch responses and reported improvement in symptoms, but noted muscle soreness as expected. Patient reported mild dizziness upon getting off mat table but decline water or sit break before leaving.   OBJECTIVE IMPAIRMENTS: decreased ROM, decreased strength, impaired flexibility,  impaired sensation, impaired UE functional use, postural dysfunction, and pain.   ACTIVITY LIMITATIONS: carrying, dressing, reach over head, hygiene/grooming, and caring for others  PARTICIPATION LIMITATIONS: meal prep, cleaning, laundry, community activity, and yard work  PERSONAL FACTORS: 3+ comorbidities: See PMH above are also affecting patient's functional outcome.   REHAB POTENTIAL: Good  CLINICAL DECISION MAKING: Stable/uncomplicated  EVALUATION COMPLEXITY: Low  GOALS: Goals reviewed with patient? Yes  SHORT TERM GOALS: Target date: 04/09/2024  Patient to be independent with initial HEP. Baseline: HEP initiated Goal status: IN PROGRESS Pt to report neck pain decrease by 50%, for improved functional mobility and ADLs. Baseline:  3/10 today; but pt reports typically between 5-10/10 Goal status:  INITIAL   LONG TERM GOALS: Target date: 04/23/2024  Patient to be independent with advanced HEP. Baseline: Not yet initiated  Goal status: IN PROGRESS  Patient to demonstrate LUE strength >/=4+/5.  Baseline: See above Goal status: IN PROGRESS  Patient to demonstrate neck ROM WFL and without pain limiting.  Baseline: see above Goal status: IN PROGRESS  Patient to report and demonstrate improved head, neck, and shoulder posture at rest and with activity.  Baseline:   Goal status: IN PROGRESS  Patient to score less than or equal to 18% on NDI for improved neck pain/less daily limitations.  Baseline: 24% Goal status: IN PROGRESS   PLAN:  PT FREQUENCY: 2x/week  PT DURATION: 6 weeks plus eval visit  PLANNED INTERVENTIONS: 97110-Therapeutic exercises, 97530- Therapeutic activity, 97112- Neuromuscular re-education, 97535- Self Care, 16109- Manual therapy, Patient/Family education, Taping, Dry Needling, Joint mobilization, and Moist heat  PLAN FOR NEXT SESSION: how was DN? Review and progress HEP; focus on exercises to lessen radicular symptoms; postural strengthening,  shoulder strength, neck ROM    Thaddeus Filippo, [place], DPT 03/25/24 11:03 AM  [facility] Health Outpatient Rehab at [facility] [address], [address] [place], [place] [address] Phone # [phone] Fax # [phone]

## 2024-03-25 ENCOUNTER — Encounter: Payer: Self-pay | Admitting: Physical Therapy

## 2024-03-25 ENCOUNTER — Ambulatory Visit: Admitting: Physical Therapy

## 2024-03-25 DIAGNOSIS — M6281 Muscle weakness (generalized): Secondary | ICD-10-CM | POA: Diagnosis not present

## 2024-03-25 DIAGNOSIS — R293 Abnormal posture: Secondary | ICD-10-CM

## 2024-03-25 DIAGNOSIS — M542 Cervicalgia: Secondary | ICD-10-CM | POA: Diagnosis not present

## 2024-03-25 NOTE — Therapy (Signed)
 OUTPATIENT PHYSICAL THERAPY CERVICAL TREATMENT   Patient Name: [name] MRN: [id number] DOB:11-10-1946, 78 y.o., female Today's Date: 03/29/2024  END OF SESSION:  PT End of Session - 03/29/24 1013     Visit Number 4    Number of Visits 13    Date for PT Re-Evaluation 04/23/24    Authorization Type [employer] Medicare    Progress Note Due on Visit 10    PT Start Time 0934    PT Stop Time 1014    PT Time Calculation (min) 40 min    Activity Tolerance Patient tolerated treatment well    Behavior During Therapy [facility] for tasks assessed/performed                Past Medical History:  Diagnosis Date   Back pain    Falls    Fibromyalgia    Hypertension    Joint pain    Joint stiffness    Joint swelling    Pure hypercholesterolemia 01/11/2020   Raynaud's disease    age 53's   Sleep apnea    Varicose veins of both lower extremities    Vitamin B12 deficiency    2020   Vitamin D  deficiency    Past Surgical History:  Procedure Laterality Date   BREAST BIOPSY Right    BREAST EXCISIONAL BIOPSY Right    FOOT FRACTURE SURGERY Right    FRACTURE SURGERY     KNEE SURGERY Left 2007   TUBAL LIGATION     Patient Active Problem List   Diagnosis Date Noted   Numbness and tingling of left leg 12/16/2023   Numbness and tingling in left arm 12/16/2023   Breast tenderness in female 12/16/2023   COVID-19 vaccination declined 12/16/2023   Mass of upper outer quadrant of right breast 12/16/2023   Encounter for screening mammogram for malignant neoplasm of breast 12/16/2023   Claudication of lower extremity (HCC) 09/12/2023   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   Chronic kidney disease, 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9 without comorbidity 01/22/2018   Varicose veins of bilateral lower extremities with other complications 06/16/2017   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 Reports that bringing/stinging sensation is less frequent. Did not work on the HEP- been busy with her mom. Requesting DN and she reports benefit.    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 Yes: NPRS scale: "the same" Pain location: L shoulder, radiating into L arm Pain description: burning, numbness Aggravating factors: worse in bed at night, later in the day Relieving factors: nothing right now  PRECAUTIONS: Other: numbness in L lateral thigh preceding the LUE numbness  RED FLAGS: None     WEIGHT BEARING RESTRICTIONS: No  FALLS:  Has patient fallen in last 6 months? 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 She reported mild L UT soreness after performing L side  gentle cervical extension SNAG 10x  Within tolerance   Skilled palpation and monitoring of tissues during TPDN Soft tissue restriction and palpable trigger pts over L UT     Trigger Point Dry Needling  Subsequent Treatment: Instructions provided previously at initial dry needling treatment.   Patient Verbal Consent Given: Yes Education Handout Provided: Previously Provided Muscles Treated: L UT using standard technique  Electrical Stimulation Performed: No Treatment Response/Outcome: pt tolerated well; denies soreness    HOME EXERCISE PROGRAM Access Code: [email or web address] URL: [email or web address] Date: 03/29/2024 Prepared by: [facility] - Outpatient  Rehab - [facility]  Exercises - Seated Shoulder Shrugs  - 1 x daily - 5 x weekly - 2 sets - 10 reps - Seated Shoulder Shrug Circles AROM Backward  - 1 x daily - 5 x weekly - 2 sets - 10 reps - Seated Shoulder Shrug Circles AROM Forward  - 1 x daily - 5 x weekly - 2 sets - 10 reps - Supine Shoulder External Rotation with Resistance  - 1 x daily - 5 x weekly - 2 sets - 10 reps -  Supine Shoulder Horizontal Abduction with Resistance  - 1 x daily - 5 x weekly - 2 sets - 10 reps - Seated Assisted Cervical Rotation with Towel  - 1 x daily - 5 x weekly - 2 sets - 10 reps - Cervical Extension AROM with Strap  - 1 x daily - 5 x weekly - 2 sets - 10 reps  Patient Education - Trigger Point Dry Needling    PATIENT EDUCATION: Education details: edu on TENS precautions, contraindications, benefits and handout; HEP update Person educated: Patient Education method: Explanation, Demonstration, Tactile cues, Verbal cues, and Handouts Education comprehension: verbalized understanding and returned demonstration    Note: Objective measures were completed at  Evaluation unless otherwise noted.  DIAGNOSTIC FINDINGS:  NCS/EMG of the arms 01/21/2023:  Bilateral median mononeuropathies across the wrists, mild on the left and moderate on the right.  Isolated mild left ulnar sensory neuropathy  PATIENT SURVEYS:  NDI 12/50 (24%)  COGNITION: Overall cognitive status: Within functional limits for tasks assessed  SENSATION: Light touch: more intense sensation and "activating the nerve pain" on LUE  POSTURE: rounded shoulders and forward head  PALPATION: Pt tender to palpation along L Upper traps, rhomboids, levator; very tender to palpation along L occiput/subocciput area.  Noted trigger point in L upper/mid trap, which radiates pain into L UE.  CERVICAL ROM:   Active ROM A/PROM (deg) eval  Flexion 48  Extension 18 stretch + radiating into L UE  Right lateral flexion 10 tightness on L  Left lateral flexion 20 crepitus  Right rotation 44 tightness in L neck  Left rotation 36 crepitus   (Blank rows = not tested)  UPPER EXTREMITY ROM:  Active ROM Right eval Left eval  Shoulder flexion  130  Shoulder extension    Shoulder abduction  130  Shoulder adduction    Shoulder extension    Shoulder internal rotation    Shoulder external rotation    Elbow flexion    Elbow  extension    Wrist flexion    Wrist extension    Wrist ulnar deviation    Wrist radial deviation    Wrist pronation    Wrist supination     (Blank rows = not tested)  UPPER EXTREMITY MMT:  MMT Right eval Left eval  Shoulder flexion 4+ 4 pain  Shoulder extension    Shoulder abduction 5 4 pain  Shoulder adduction    Shoulder extension    Shoulder internal rotation  4Limited in flex int rot  Shoulder external rotation  4  Middle trapezius    Lower trapezius    Elbow flexion  5  Elbow extension  5  Wrist flexion  5  Wrist extension  5  Wrist ulnar deviation    Wrist radial deviation    Wrist pronation    Wrist supination    Grip strength 14# 20#   (Blank rows = not tested)    TREATMENT DATE: 03/11/2024                                                                                                                                 PATIENT EDUCATION:  Education details: PT eval results, POC, initial HEP Person educated: Patient Education method: Explanation, Demonstration, and Handouts Education comprehension: verbalized understanding and returned demonstration  HOME EXERCISE PROGRAM: Access Code: [email or web address] URL: [email or web address] Date: 03/11/2024 Prepared by: [facility] - Outpatient  Rehab - [facility]  Exercises - Seated Passive Cervical Retraction  - 1-2 x daily - 7 x weekly - 2 sets - 10 reps  Patient Education - Trigger Point Dry Needling  ASSESSMENT:  CLINICAL IMPRESSION: Patient arrived  to session with report of good tolerance for DN last session and reports noncompliance with HEP. Reviewed HEP update exercises with cueing for proper form. Proceeded with gentle cervical ROM exercises; pt noted some L UT soreness with L cervical rotation but requested new exercises be added into HEP. Patient requested DN to L UT. Denies complaints upon leaving.   OBJECTIVE IMPAIRMENTS: decreased ROM, decreased strength, impaired flexibility, impaired  sensation, impaired UE functional use, postural dysfunction, and pain.   ACTIVITY LIMITATIONS: carrying, dressing, reach over head, hygiene/grooming, and caring for others  PARTICIPATION LIMITATIONS: meal prep, cleaning, laundry, community activity, and yard work  PERSONAL FACTORS: 3+ comorbidities: See PMH above are also affecting patient's functional outcome.   REHAB POTENTIAL: Good  CLINICAL DECISION MAKING: Stable/uncomplicated  EVALUATION COMPLEXITY: Low  GOALS: Goals reviewed with patient? Yes  SHORT TERM GOALS: Target date: 04/09/2024  Patient to be independent with initial HEP. Baseline: HEP initiated Goal status: IN PROGRESS Pt to report neck pain decrease by 50%, for improved functional mobility and ADLs. Baseline:  3/10 today; but pt reports typically between 5-10/10 Goal status:  INITIAL   LONG TERM GOALS: Target date: 04/23/2024  Patient to be independent with advanced HEP. Baseline: Not yet initiated  Goal status: IN PROGRESS  Patient to demonstrate LUE strength >/=4+/5.  Baseline: See above Goal status: IN PROGRESS  Patient to demonstrate neck ROM WFL and without pain limiting.  Baseline: see above Goal status: IN PROGRESS  Patient to report and demonstrate improved head, neck, and shoulder posture at rest and with activity.  Baseline:   Goal status: IN PROGRESS  Patient to score less than or equal to 18% on NDI for improved neck pain/less daily limitations.  Baseline: 24% Goal status: IN PROGRESS   PLAN:  PT FREQUENCY: 2x/week  PT DURATION: 6 weeks plus eval visit  PLANNED INTERVENTIONS: 97110-Therapeutic exercises, 97530- Therapeutic activity, 97112- Neuromuscular re-education, 97535- Self Care, 21308- Manual therapy, Patient/Family education, Taping, Dry Needling, Joint mobilization, and Moist heat  PLAN FOR NEXT SESSION: how was DN? Review and progress HEP; focus on exercises to lessen radicular symptoms; postural strengthening, shoulder  strength, neck ROM    Thaddeus Filippo, [place], DPT 03/29/24 10:14 AM  [facility] Health Outpatient Rehab at [facility] [address] [place], [place] [address] Phone # [phone] Fax # [phone]

## 2024-03-29 ENCOUNTER — Encounter: Payer: Self-pay | Admitting: Physical Therapy

## 2024-03-29 ENCOUNTER — Ambulatory Visit: Admitting: Physical Therapy

## 2024-03-29 DIAGNOSIS — R293 Abnormal posture: Secondary | ICD-10-CM | POA: Diagnosis not present

## 2024-03-29 DIAGNOSIS — M542 Cervicalgia: Secondary | ICD-10-CM

## 2024-03-29 DIAGNOSIS — M6281 Muscle weakness (generalized): Secondary | ICD-10-CM

## 2024-03-29 NOTE — Patient Instructions (Signed)
TENS stands for Transcutaneous Electrical Nerve Stimulation. In other words, electrical impulses are allowed to pass through the skin in order to excite a nerve.   Purpose and Use of TENS:  TENS is a method used to manage acute and chronic pain without the use of drugs. It has been effective in managing pain associated with surgery, sprains, strains, trauma, rheumatoid arthritis, and neuralgias. It is a non-addictive, low risk, and non-invasive technique used to control pain. It is not, by any means, a curative form of treatment.   How TENS Works:  Most TENS units are a small pocket-sized unit powered by one 9 volt battery. Attached to the outside of the unit are two lead wires where two pins and/or snaps connect on each wire. All units come with a set of four reusable pads or electrodes. These are placed on the skin surrounding the area involved. By inserting the leads into  the pads, the electricity can pass from the unit making the circuit complete.  As the intensity is turned up slowly, the electrical current enters the body from the electrodes through the skin to the surrounding nerve fibers. This triggers the release of hormones from within the body. These hormones contain pain relievers. By increasing the circulation of these hormones, the person's pain may be lessened. It is also believed that the electrical stimulation itself helps to block the pain messages being sent to the brain, thus also decreasing the body's perception of pain.   Hazards:  TENS units are NOT to be used by patients with PACEMAKERS, DEFIBRILLATORS, DIABETIC PUMPS, PREGNANT WOMEN, and patients with SEIZURE DISORDERS.  TENS units are NOT to be used over the heart, throat, brain, or spinal cord.  One of the major side effects from the TENS unit may be skin irritation. Some people may develop a rash if they are sensitive to the materials used in the electrodes or the connecting wires.   Wear the unit for 15 minutes.   Avoid  overuse due the body getting used to the stem making it not as effective over time.     TENS UNIT  This is helpful for muscle pain and spasm.   Search and Purchase a TENS 7000 2nd edition at www.tenspros.com or www.amazon.com  (It should be less than $30)     TENS unit instructions:   Do not shower or bathe with the unit on  Turn the unit off before removing electrodes or batteries  If the electrodes lose stickiness add a drop of water to the electrodes after they are disconnected from the unit and place on plastic sheet. If you continued to have difficulty, call the TENS unit company to purchase more electrodes.  Do not apply lotion on the skin area prior to use. Make sure the skin is clean and dry as this will help prolong the life of the electrodes.  After use, always check skin for unusual red areas, rash or other skin difficulties. If there are any skin problems, does not apply electrodes to the same area.  Never remove the electrodes from the unit by pulling the wires.  Do not use the TENS unit or electrodes other than as directed.  Do not change electrode placement without consulting your therapist or physician.  Keep 2 fingers with between each electrode.       

## 2024-03-31 NOTE — Therapy (Signed)
 OUTPATIENT PHYSICAL THERAPY CERVICAL TREATMENT   Patient Name: [name] MRN: [id number] DOB:01/15/1946, 78 y.o., female Today's Date: 04/01/2024  END OF SESSION:  PT End of Session - 04/01/24 1006     Visit Number 5    Number of Visits 13    Date for PT Re-Evaluation 04/23/24    Authorization Type [employer]    Progress Note Due on Visit 10    PT Start Time 0934    PT Stop Time 1013    PT Time Calculation (min) 39 min    Activity Tolerance Patient tolerated treatment well    Behavior During Therapy [facility] for tasks assessed/performed                 Past Medical History:  Diagnosis Date   Back pain    Falls    Fibromyalgia    Hypertension    Joint pain    Joint stiffness    Joint swelling    Pure hypercholesterolemia 01/11/2020   Raynaud's disease    age 33's   Sleep apnea    Varicose veins of both lower extremities    Vitamin B12 deficiency    2020   Vitamin D  deficiency    Past Surgical History:  Procedure Laterality Date   BREAST BIOPSY Right    BREAST EXCISIONAL BIOPSY Right    FOOT FRACTURE SURGERY Right    FRACTURE SURGERY     KNEE SURGERY Left 2007   TUBAL LIGATION     Patient Active Problem List   Diagnosis Date Noted   Numbness and tingling of left leg 12/16/2023   Numbness and tingling in left arm 12/16/2023   Breast tenderness in female 12/16/2023   COVID-19 vaccination declined 12/16/2023   Mass of upper outer quadrant of right breast 12/16/2023   Encounter for screening mammogram for malignant neoplasm of breast 12/16/2023   Claudication of lower extremity (HCC) 09/12/2023   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   Chronic kidney disease, 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9 without comorbidity 01/22/2018   Varicose veins of bilateral lower extremities with other complications 06/16/2017   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 Felt fine after DN also went to see her massage therapist. Has not had the time to perform HEP.    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 Yes: NPRS scale: "the same, it doesn't change" Pain location: L shoulder, radiating into L arm Pain description: burning, numbness Aggravating factors: worse in bed at night, later in the day Relieving factors: nothing right now  PRECAUTIONS: Other: numbness in L lateral thigh preceding the LUE numbness  RED FLAGS: None     WEIGHT BEARING RESTRICTIONS: No  FALLS:  Has patient fallen in last 6 months? 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; enjoys cooking, gardening  PLOF: Independent  PATIENT GOALS: Would love to eliminate this sensation of numbness  NEXT MD VISIT: 06/22/2024  OBJECTIVE:    TODAY'S TREATMENT: 04/01/24 Activity Comments  gentle cervical rotation SNAG 10x  Good tolerance   gentle cervical extension SNAG Good tolerance   TB shoulder extension red TB 2x10  Good form   bent over row on counter top 5# 2x10 each side  Report of some L knee "cracking" but able to proceed   IFC to L UT  10 min; amplitude to tolerance        PATIENT EDUCATION: Education details: edu on importance of HEP compliance and gentle ROM to maintain jt mobility; edu on TENS and its use/benefits Person educated: Patient Education method: Explanation, Demonstration, Tactile cues, Verbal cues, and Handouts Education comprehension: verbalized understanding    HOME EXERCISE PROGRAM Access Code: [email or web address] URL: [email or web address] Date: 03/29/2024 Prepared by: [facility] - Outpatient  Rehab - [facility]  Exercises - Seated Shoulder Shrugs  - 1 x daily - 5 x weekly - 2 sets - 10 reps - Seated Shoulder Shrug Circles AROM Backward  - 1 x daily - 5 x weekly - 2 sets - 10 reps - Seated Shoulder Shrug Circles AROM Forward  - 1 x daily - 5 x weekly - 2 sets - 10 reps - Supine Shoulder External Rotation with Resistance  - 1 x daily - 5 x weekly - 2 sets - 10 reps - Supine Shoulder Horizontal Abduction with Resistance  - 1 x daily - 5 x weekly - 2 sets - 10 reps - Seated Assisted Cervical Rotation with Towel  - 1 x daily - 5 x weekly - 2 sets - 10 reps - Cervical Extension AROM with Strap  - 1 x daily - 5 x weekly - 2 sets - 10 reps  Patient Education - Trigger Point Dry Needling    Note: Objective measures were completed at Evaluation  unless otherwise noted.  DIAGNOSTIC FINDINGS:  NCS/EMG of the arms 01/21/2023:  Bilateral median mononeuropathies across the wrists, mild on the left and moderate on the right.  Isolated mild left ulnar sensory neuropathy  PATIENT SURVEYS:  NDI 12/50 (24%)  COGNITION: Overall cognitive status: Within functional limits for tasks assessed  SENSATION: Light touch: more intense sensation and "activating the nerve pain" on LUE  POSTURE: rounded shoulders and forward head  PALPATION: Pt tender to palpation along L Upper traps, rhomboids, levator; very tender  to palpation along L occiput/subocciput area.  Noted trigger point in L upper/mid trap, which radiates pain into L UE.  CERVICAL ROM:   Active ROM A/PROM (deg) eval  Flexion 48  Extension 18 stretch + radiating into L UE  Right lateral flexion 10 tightness on L  Left lateral flexion 20 crepitus  Right rotation 44 tightness in L neck  Left rotation 36 crepitus   (Blank rows = not tested)  UPPER EXTREMITY ROM:  Active ROM Right eval Left eval  Shoulder flexion  130  Shoulder extension    Shoulder abduction  130  Shoulder adduction    Shoulder extension    Shoulder internal rotation    Shoulder external rotation    Elbow flexion    Elbow extension    Wrist flexion    Wrist extension    Wrist ulnar deviation    Wrist radial deviation    Wrist pronation    Wrist supination     (Blank rows = not tested)  UPPER EXTREMITY MMT:  MMT Right eval Left eval  Shoulder flexion 4+ 4 pain  Shoulder extension    Shoulder abduction 5 4 pain  Shoulder adduction    Shoulder extension    Shoulder internal rotation  4Limited in flex int rot  Shoulder external rotation  4  Middle trapezius    Lower trapezius    Elbow flexion  5  Elbow extension  5  Wrist flexion  5  Wrist extension  5  Wrist ulnar deviation    Wrist radial deviation    Wrist pronation    Wrist supination    Grip strength 14# 20#   (Blank rows = not  tested)    TREATMENT DATE: 03/11/2024                                                                                                                                 PATIENT EDUCATION:  Education details: PT eval results, POC, initial HEP Person educated: Patient Education method: Explanation, Demonstration, and Handouts Education comprehension: verbalized understanding and returned demonstration  HOME EXERCISE PROGRAM: Access Code: [email or web address] URL: [email or web address] Date: 03/11/2024 Prepared by: [facility] - Outpatient  Rehab - [facility]  Exercises - Seated Passive Cervical Retraction  - 1-2 x daily - 7 x weekly - 2 sets - 10 reps  Patient Education - Trigger Point Dry Needling  ASSESSMENT:  CLINICAL IMPRESSION: Patient arrived to session with report of no new issues; admits to HEP noncompliance. Educated pt on importance of maintaining mobility with exercises given. Patient performed progressive postural strengthening activities- today this was tolerated well and without complaints. Review of cervical ROM was also tolerated well. Proceeded with IFC application to L UT as patient is interest in purchasing a TENS unit. Normal skin response after modality (patient with bruise from cupping at massage therapy over L UT). No complaints at end of session.   OBJECTIVE IMPAIRMENTS: decreased ROM, decreased  strength, impaired flexibility, impaired sensation, impaired UE functional use, postural dysfunction, and pain.   ACTIVITY LIMITATIONS: carrying, dressing, reach over head, hygiene/grooming, and caring for others  PARTICIPATION LIMITATIONS: meal prep, cleaning, laundry, community activity, and yard work  PERSONAL FACTORS: 3+ comorbidities: See PMH above are also affecting patient's functional outcome.   REHAB POTENTIAL: Good  CLINICAL DECISION MAKING: Stable/uncomplicated  EVALUATION COMPLEXITY: Low  GOALS: Goals reviewed with patient? Yes  SHORT TERM  GOALS: Target date: 04/09/2024  Patient to be independent with initial HEP. Baseline: HEP initiated Goal status: IN PROGRESS Pt to report neck pain decrease by 50%, for improved functional mobility and ADLs. Baseline:  3/10 today; but pt reports typically between 5-10/10 Goal status:  INITIAL   LONG TERM GOALS: Target date: 04/23/2024  Patient to be independent with advanced HEP. Baseline: Not yet initiated  Goal status: IN PROGRESS  Patient to demonstrate LUE strength >/=4+/5.  Baseline: See above Goal status: IN PROGRESS  Patient to demonstrate neck ROM WFL and without pain limiting.  Baseline: see above Goal status: IN PROGRESS  Patient to report and demonstrate improved head, neck, and shoulder posture at rest and with activity.  Baseline:   Goal status: IN PROGRESS  Patient to score less than or equal to 18% on NDI for improved neck pain/less daily limitations.  Baseline: 24% Goal status: IN PROGRESS   PLAN:  PT FREQUENCY: 2x/week  PT DURATION: 6 weeks plus eval visit  PLANNED INTERVENTIONS: 97110-Therapeutic exercises, 97530- Therapeutic activity, 97112- Neuromuscular re-education, 97535- Self Care, 16109- Manual therapy, Patient/Family education, Taping, Dry Needling, Joint mobilization, and Moist heat  PLAN FOR NEXT SESSION: Review and progress HEP; focus on exercises to lessen radicular symptoms; postural strengthening, shoulder strength, neck ROM    Thaddeus Filippo, PT, DPT 04/01/24 10:17 AM  [facility] Re[facility] at [facility] [address], [address] [place], [place] [address] Phone # [phone] Fax # [phone]

## 2024-04-01 ENCOUNTER — Ambulatory Visit: Admitting: Physical Therapy

## 2024-04-01 ENCOUNTER — Encounter: Payer: Self-pay | Admitting: Physical Therapy

## 2024-04-01 DIAGNOSIS — M542 Cervicalgia: Secondary | ICD-10-CM | POA: Diagnosis not present

## 2024-04-01 DIAGNOSIS — M6281 Muscle weakness (generalized): Secondary | ICD-10-CM | POA: Diagnosis not present

## 2024-04-01 DIAGNOSIS — R293 Abnormal posture: Secondary | ICD-10-CM

## 2024-04-01 NOTE — Therapy (Incomplete)
 OUTPATIENT PHYSICAL THERAPY CERVICAL TREATMENT   Patient Name: [name] MRN: [id number] DOB:12/27/1945, 78 y.o., female Today's Date: 04/01/2024  END OF SESSION:        Past Medical History:  Diagnosis Date   Back pain    Falls    Fibromyalgia    Hypertension    Joint pain    Joint stiffness    Joint swelling    Pure hypercholesterolemia 01/11/2020   Raynaud's disease    age 53's   Sleep apnea    Varicose veins of both lower extremities    Vitamin B12 deficiency    2020   Vitamin D  deficiency    Past Surgical History:  Procedure Laterality Date   BREAST BIOPSY Right    BREAST EXCISIONAL BIOPSY Right    FOOT FRACTURE SURGERY Right    FRACTURE SURGERY     KNEE SURGERY Left 2007   TUBAL LIGATION     Patient Active Problem List   Diagnosis Date Noted   Numbness and tingling of left leg 12/16/2023   Numbness and tingling in left arm 12/16/2023   Breast tenderness in female 12/16/2023   COVID-19 vaccination declined 12/16/2023   Mass of upper outer quadrant of right breast 12/16/2023   Encounter for screening mammogram for malignant neoplasm of breast 12/16/2023   Claudication of lower extremity (HCC) 09/12/2023   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   Chronic kidney disease, stage II (mild) 08/08/2021   Benign hypertension with CKD (chronic kidney disease) stage III (HCC) 08/08/2021   Vitamin D  deficiency 05/29/2020   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   Class 2 severe obesity with serious comorbidity and body mass index (BMI) of 37.0 to 37.9 in adult (HCC) 05/29/2020   Blurred vision 04/19/2020   Acute nonintractable headache 04/19/2020   Essential hypertension 01/11/2020   Pure hypercholesterolemia 01/11/2020   Snorings 01/22/2018   Hypersomnia with sleep apnea 01/22/2018   Nocturia more than twice per night 01/22/2018   Obesity with body mass index (BMI) of 35.0 to 39.9 without comorbidity  01/22/2018   Varicose veins of bilateral lower extremities with other complications 06/16/2017   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  Rationale for Evaluation and Treatment: Rehabilitation  ONSET DATE: at least 6 months ago  SUBJECTIVE:  SUBJECTIVE STATEMENT: A little tired. Felt fine after DN also went to see her massage therapist. Has not had the time to perform HEP.    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 Yes: NPRS scale: "the same, it doesn't change" Pain location: L shoulder, radiating into L arm Pain description: burning, numbness Aggravating factors: worse in bed at night, later in the day Relieving factors: nothing right now  PRECAUTIONS: Other: numbness in L lateral thigh preceding the LUE numbness  RED FLAGS: None     WEIGHT BEARING RESTRICTIONS: No  FALLS:  Has patient fallen in last 6 months? 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   Patient Verbal Consent Given: Yes Education Handout Provided: Previously Provided Muscles Treated: *** Electrical Stimulation Performed: No Treatment Response/Outcome: ***      PATIENT EDUCATION: Education details: edu on importance of HEP compliance and gentle ROM to maintain jt mobility; edu on TENS and its use/benefits Person educated: Patient Education method: Explanation, Demonstration, Tactile cues, Verbal cues, and Handouts Education comprehension: verbalized understanding    HOME EXERCISE PROGRAM Access Code: [email or web address] URL: [email or web address] Date: 03/29/2024 Prepared by: [facility] - Outpatient  Rehab - [facility]  Exercises - Seated Shoulder Shrugs  - 1 x daily - 5 x weekly - 2 sets - 10 reps - Seated Shoulder Shrug Circles AROM Backward  - 1 x daily - 5 x weekly - 2 sets - 10 reps - Seated Shoulder Shrug Circles AROM Forward  - 1 x daily - 5 x weekly - 2 sets - 10 reps - Supine Shoulder External Rotation with Resistance  - 1 x daily - 5 x weekly - 2 sets - 10 reps - Supine Shoulder Horizontal Abduction with Resistance  - 1 x daily - 5 x weekly - 2 sets - 10 reps - Seated Assisted Cervical Rotation with Towel  - 1 x daily - 5 x weekly - 2 sets - 10 reps - Cervical Extension AROM with Strap  - 1 x daily - 5 x weekly - 2 sets - 10 reps  Patient Education - Trigger Point Dry Needling    Note: Objective measures were completed at Evaluation unless otherwise noted.  DIAGNOSTIC FINDINGS:  NCS/EMG of the arms 01/21/2023:  Bilateral median mononeuropathies across the wrists, mild on the left and moderate on the right.  Isolated mild left ulnar sensory neuropathy  PATIENT SURVEYS:  NDI 12/50 (24%)  COGNITION: Overall cognitive status: Within functional limits for tasks assessed  SENSATION: Light touch: more intense sensation and  "activating the nerve pain" on LUE  POSTURE: rounded shoulders and forward head  PALPATION: Pt tender to palpation along L Upper traps, rhomboids, levator; very tender to palpation along L occiput/subocciput  area.  Noted trigger point in L upper/mid trap, which radiates pain into L UE.  CERVICAL ROM:   Active ROM A/PROM (deg) eval  Flexion 48  Extension 18 stretch + radiating into L UE  Right lateral flexion 10 tightness on L  Left lateral flexion 20 crepitus  Right rotation 44 tightness in L neck  Left rotation 36 crepitus   (Blank rows = not tested)  UPPER EXTREMITY ROM:  Active ROM Right eval Left eval  Shoulder flexion  130  Shoulder extension    Shoulder abduction  130  Shoulder adduction    Shoulder extension    Shoulder internal rotation    Shoulder external rotation    Elbow flexion    Elbow extension    Wrist flexion    Wrist extension    Wrist ulnar deviation    Wrist radial deviation    Wrist pronation    Wrist supination     (Blank rows = not tested)  UPPER EXTREMITY MMT:  MMT Right eval Left eval  Shoulder flexion 4+ 4 pain  Shoulder extension    Shoulder abduction 5 4 pain  Shoulder adduction    Shoulder extension    Shoulder internal rotation  4Limited in flex int rot  Shoulder external rotation  4  Middle trapezius    Lower trapezius    Elbow flexion  5  Elbow extension  5  Wrist flexion  5  Wrist extension  5  Wrist ulnar deviation    Wrist radial deviation    Wrist pronation    Wrist supination    Grip strength 14# 20#   (Blank rows = not tested)    TREATMENT DATE: 03/11/2024                                                                                                                                 PATIENT EDUCATION:  Education details: PT eval results, POC, initial HEP Person educated: Patient Education method: Explanation, Demonstration, and Handouts Education comprehension: verbalized understanding and returned  demonstration  HOME EXERCISE PROGRAM: Access Code: [email or web address] URL: [email or web address] Date: 03/11/2024 Prepared by: [facility] - Outpatient  Rehab - [facility]  Exercises - Seated Passive Cervical Retraction  - 1-2 x daily - 7 x weekly - 2 sets - 10 reps  Patient Education - Trigger Point Dry Needling  ASSESSMENT:  CLINICAL IMPRESSION: Patient arrived to session with report of no new issues; admits to HEP noncompliance. Educated pt on importance of maintaining mobility with exercises given. Patient performed progressive postural strengthening activities- today this was tolerated well and without complaints. Review of cervical ROM was also tolerated well. Proceeded with IFC application to L UT as patient is interest in purchasing a TENS unit. Normal skin response after modality (patient with bruise from cupping at massage therapy over L UT). No complaints at end of session.   OBJECTIVE IMPAIRMENTS: decreased ROM, decreased strength, impaired flexibility, impaired sensation,  impaired UE functional use, postural dysfunction, and pain.   ACTIVITY LIMITATIONS: carrying, dressing, reach over head, hygiene/grooming, and caring for others  PARTICIPATION LIMITATIONS: meal prep, cleaning, laundry, community activity, and yard work  PERSONAL FACTORS: 3+ comorbidities: See PMH above are also affecting patient's functional outcome.   REHAB POTENTIAL: Good  CLINICAL DECISION MAKING: Stable/uncomplicated  EVALUATION COMPLEXITY: Low  GOALS: Goals reviewed with patient? Yes  SHORT TERM GOALS: Target date: 04/09/2024  Patient to be independent with initial HEP. Baseline: HEP initiated Goal status: IN PROGRESS Pt to report neck pain decrease by 50%, for improved functional mobility and ADLs. Baseline:  3/10 today; but pt reports typically between 5-10/10 Goal status:  INITIAL   LONG TERM GOALS: Target date: 04/23/2024  Patient to be independent with advanced  HEP. Baseline: Not yet initiated  Goal status: IN PROGRESS  Patient to demonstrate LUE strength >/=4+/5.  Baseline: See above Goal status: IN PROGRESS  Patient to demonstrate neck ROM WFL and without pain limiting.  Baseline: see above Goal status: IN PROGRESS  Patient to report and demonstrate improved head, neck, and shoulder posture at rest and with activity.  Baseline:   Goal status: IN PROGRESS  Patient to score less than or equal to 18% on NDI for improved neck pain/less daily limitations.  Baseline: 24% Goal status: IN PROGRESS   PLAN:  PT FREQUENCY: 2x/week  PT DURATION: 6 weeks plus eval visit  PLANNED INTERVENTIONS: 97110-Therapeutic exercises, 97530- Therapeutic activity, 97112- Neuromuscular re-education, 97535- Self Care, 16109- Manual therapy, Patient/Family education, Taping, Dry Needling, Joint mobilization, and Moist heat  PLAN FOR NEXT SESSION: Review and progress HEP; focus on exercises to lessen radicular symptoms; postural strengthening, shoulder strength, neck ROM    Thaddeus Filippo, PT, DPT 04/01/24 12:08 PM  [facility] Health Outpatient Rehab at [facility] [address] [place], [place] [address] Phone # [phone] Fax # [phone]

## 2024-04-05 ENCOUNTER — Ambulatory Visit: Admitting: Physical Therapy

## 2024-04-05 DIAGNOSIS — Z880 Allergy status to penicillin: Secondary | ICD-10-CM | POA: Diagnosis not present

## 2024-04-05 DIAGNOSIS — Z833 Family history of diabetes mellitus: Secondary | ICD-10-CM | POA: Diagnosis not present

## 2024-04-05 DIAGNOSIS — M1990 Unspecified osteoarthritis, unspecified site: Secondary | ICD-10-CM | POA: Diagnosis not present

## 2024-04-05 DIAGNOSIS — E785 Hyperlipidemia, unspecified: Secondary | ICD-10-CM | POA: Diagnosis not present

## 2024-04-05 DIAGNOSIS — Z6834 Body mass index (BMI) 34.0-34.9, adult: Secondary | ICD-10-CM | POA: Diagnosis not present

## 2024-04-05 DIAGNOSIS — Z87891 Personal history of nicotine dependence: Secondary | ICD-10-CM | POA: Diagnosis not present

## 2024-04-05 DIAGNOSIS — M810 Age-related osteoporosis without current pathological fracture: Secondary | ICD-10-CM | POA: Diagnosis not present

## 2024-04-05 DIAGNOSIS — N182 Chronic kidney disease, stage 2 (mild): Secondary | ICD-10-CM | POA: Diagnosis not present

## 2024-04-05 DIAGNOSIS — E669 Obesity, unspecified: Secondary | ICD-10-CM | POA: Diagnosis not present

## 2024-04-05 DIAGNOSIS — Z8249 Family history of ischemic heart disease and other diseases of the circulatory system: Secondary | ICD-10-CM | POA: Diagnosis not present

## 2024-04-05 DIAGNOSIS — R252 Cramp and spasm: Secondary | ICD-10-CM | POA: Diagnosis not present

## 2024-04-05 DIAGNOSIS — Z008 Encounter for other general examination: Secondary | ICD-10-CM | POA: Diagnosis not present

## 2024-04-05 DIAGNOSIS — H3530 Unspecified macular degeneration: Secondary | ICD-10-CM | POA: Diagnosis not present

## 2024-04-08 ENCOUNTER — Ambulatory Visit: Admitting: Physical Therapy

## 2024-04-12 ENCOUNTER — Other Ambulatory Visit: Payer: Self-pay | Admitting: Internal Medicine

## 2024-04-12 DIAGNOSIS — M542 Cervicalgia: Secondary | ICD-10-CM

## 2024-04-16 NOTE — Therapy (Signed)
 OUTPATIENT PHYSICAL THERAPY CERVICAL TREATMENT   Patient Name: [name] MRN: [id number] DOB:17-Jan-1946, 78 y.o., female Today's Date: 04/19/2024  END OF SESSION:  PT End of Session - 04/19/24 1053     Visit Number 6    Number of Visits 13    Date for PT Re-Evaluation 04/23/24    Authorization Type [employer]    Progress Note Due on Visit 10    PT Start Time 1019    PT Stop Time 1055    PT Time Calculation (min) 36 min    Activity Tolerance Patient tolerated treatment well    Behavior During Therapy WFL for tasks assessed/performed                  Past Medical History:  Diagnosis Date   Back pain    Falls    Fibromyalgia    Hypertension    Joint pain    Joint stiffness    Joint swelling    Pure hypercholesterolemia 01/11/2020   Raynaud's disease    age 68's   Sleep apnea    Varicose veins of both lower extremities    Vitamin B12 deficiency    2020   Vitamin D  deficiency    Past Surgical History:  Procedure Laterality Date   BREAST BIOPSY Right    BREAST EXCISIONAL BIOPSY Right    FOOT FRACTURE SURGERY Right    FRACTURE SURGERY     KNEE SURGERY Left 2007   TUBAL LIGATION     Patient Active Problem List   Diagnosis Date Noted   Numbness and tingling of left leg 12/16/2023   Numbness and tingling in left arm 12/16/2023   Breast tenderness in female 12/16/2023   COVID-19 vaccination declined 12/16/2023   Mass of upper outer quadrant of right breast 12/16/2023   Encounter for screening mammogram for malignant neoplasm of breast 12/16/2023   Claudication of lower extremity (HCC) 09/12/2023   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   Chronic kidney disease, 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9 without comorbidity 01/22/2018   Varicose veins of bilateral lower extremities with other complications 06/16/2017   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 Reports that the cupping her massage therapist is doing is helping.    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 Yes: NPRS scale: "2/10" Pain location: L shoulder, radiating into L arm Pain description: burning, numbness Aggravating factors: worse in bed at night, later in the day Relieving factors: nothing right now  PRECAUTIONS: Other: numbness in L lateral thigh preceding the LUE numbness  RED FLAGS: None     WEIGHT BEARING RESTRICTIONS: No  FALLS:  Has patient fallen in last 6 months? 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 Lead through slow rhythmic breathing to assist in muscle relaxation during DN  STM and manual TRP  L UT and LS                 Trigger Point Dry Needling  Subsequent Treatment: Instructions provided previously at initial dry needling treatment.   Patient Verbal Consent Given: Yes Education Handout Provided: Previously Provided Muscles Treated: L UT using standard technique Electrical Stimulation Performed: No Treatment Response/Outcome: tolerated well       PATIENT EDUCATION: Education details: provided edu on DN policy change- pt reported understanding and requested to move forward with DN today; reviewed post-DN instructions to reduce muscle soreness, discussed planned DC next session  Person educated: Patient Education method: Explanation, Demonstration, Tactile cues, Verbal cues, and Handouts Education comprehension: verbalized understanding    HOME EXERCISE PROGRAM Access Code: [email or web address] URL: [email or web address] Date: 03/29/2024 Prepared by: [facility] - Outpatient  Rehab - [facility]  Exercises - Seated Shoulder Shrugs  - 1 x daily - 5 x weekly - 2 sets - 10 reps - Seated Shoulder Shrug Circles AROM Backward  - 1 x daily - 5 x weekly - 2 sets - 10 reps - Seated Shoulder Shrug Circles AROM Forward  - 1 x daily - 5 x weekly - 2 sets - 10 reps - Supine Shoulder External  Rotation with Resistance  - 1 x daily - 5 x weekly - 2 sets - 10 reps - Supine Shoulder Horizontal Abduction with Resistance  - 1 x daily - 5 x weekly - 2 sets - 10 reps - Seated Assisted Cervical Rotation with Towel  - 1 x daily - 5 x weekly - 2 sets - 10 reps - Cervical Extension AROM with Strap  - 1 x daily - 5 x weekly - 2 sets - 10 reps  Patient Education - Trigger Point Dry Needling    Note: Objective measures were completed at Evaluation unless otherwise noted.  DIAGNOSTIC FINDINGS:  NCS/EMG of the arms 01/21/2023:  Bilateral median mononeuropathies across the wrists, mild on the left and moderate on the right.  Isolated mild left ulnar sensory neuropathy  PATIENT SURVEYS:  NDI 12/50 (24%)  COGNITION: Overall cognitive status: Within functional limits for tasks assessed  SENSATION: Light touch: more intense sensation and "activating the nerve pain" on LUE  POSTURE: rounded shoulders and forward head  PALPATION: Pt tender to palpation along L Upper traps, rhomboids, levator; very tender to palpation along L occiput/subocciput area.  Noted trigger point in L upper/mid trap, which radiates pain into L UE.  CERVICAL ROM:   Active ROM A/PROM (deg) eval  Flexion 48  Extension 18 stretch + radiating into L UE  Right lateral flexion 10 tightness on L  Left lateral flexion 20 crepitus  Right rotation 44 tightness in L neck  Left rotation 36 crepitus   (Blank rows = not tested)  UPPER EXTREMITY ROM:  Active ROM Right eval Left eval  Shoulder flexion  130  Shoulder extension    Shoulder abduction  130  Shoulder adduction    Shoulder extension    Shoulder internal rotation    Shoulder external rotation    Elbow flexion    Elbow extension    Wrist flexion    Wrist extension    Wrist ulnar deviation    Wrist radial deviation    Wrist pronation    Wrist supination     (Blank rows = not tested)  UPPER EXTREMITY MMT:  MMT Right eval Left eval  Shoulder flexion  4+ 4 pain  Shoulder extension    Shoulder abduction 5 4 pain  Shoulder adduction    Shoulder extension    Shoulder internal rotation  4Limited in flex int rot  Shoulder external rotation  4  Middle trapezius    Lower trapezius    Elbow flexion  5  Elbow extension  5  Wrist flexion  5  Wrist extension  5  Wrist ulnar deviation    Wrist radial deviation    Wrist pronation    Wrist supination    Grip strength 14# 20#   (Blank rows = not tested)    TREATMENT DATE: 03/11/2024                                                                                                                                 PATIENT EDUCATION:  Education details: PT eval results, POC, initial HEP Person educated: Patient Education method: Explanation, Demonstration, and Handouts Education comprehension: verbalized understanding and returned demonstration  HOME EXERCISE PROGRAM: Access Code: [email or web address] URL: [email or web address] Date: 03/11/2024 Prepared by: [facility] - Outpatient  Rehab - [facility]  Exercises - Seated Passive Cervical Retraction  - 1-2 x daily - 7 x weekly - 2 sets - 10 reps  Patient Education - Trigger Point Dry Needling  ASSESSMENT:  CLINICAL IMPRESSION: Patient arrived to session with report of improved radicular symptoms in L UE from cupping that her massage therapist is doing. Patient requests continued DN services d/t reporting benefit from this modality  as well and reports understanding of billing policy change. Patient tolerated DN well coupled with TPR. No complaints at end of session.   OBJECTIVE IMPAIRMENTS: decreased ROM, decreased strength, impaired flexibility, impaired sensation, impaired UE functional use, postural dysfunction, and pain.   ACTIVITY LIMITATIONS: carrying, dressing, reach over head, hygiene/grooming, and caring for others  PARTICIPATION LIMITATIONS: meal prep, cleaning, laundry, community activity, and yard work  PERSONAL  FACTORS: 3+ comorbidities: See PMH above are also affecting patient's functional outcome.   REHAB POTENTIAL: Good  CLINICAL DECISION MAKING: Stable/uncomplicated  EVALUATION COMPLEXITY: Low  GOALS: Goals reviewed with patient? Yes  SHORT TERM GOALS: Target date: 04/09/2024  Patient to be independent with initial HEP. Baseline: HEP initiated Goal status: IN PROGRESS Pt to report neck pain decrease by 50%, for improved functional mobility and ADLs. Baseline:  3/10 today; but pt reports typically between 5-10/10 Goal status:  INITIAL   LONG TERM GOALS: Target date: 04/23/2024  Patient to be independent with advanced HEP. Baseline: Not yet initiated  Goal status: IN PROGRESS  Patient to demonstrate LUE strength >/=4+/5.  Baseline: See above Goal status: IN PROGRESS  Patient to demonstrate neck ROM WFL and without pain limiting.  Baseline: see above Goal status: IN PROGRESS  Patient to report and demonstrate improved head, neck, and shoulder posture at rest and with activity.  Baseline:   Goal status: IN PROGRESS  Patient to score less than or equal to 18% on NDI for improved neck pain/less daily limitations.  Baseline: 24% Goal status: IN PROGRESS   PLAN:  PT FREQUENCY: 2x/week  PT DURATION: 6 weeks plus eval visit  PLANNED INTERVENTIONS: 97110-Therapeutic exercises, 97530- Therapeutic activity, 97112- Neuromuscular re-education, 97535- Self Care, [address]454- Manual therapy, Patient/Family education, Taping, Dry Needling, Joint mobilization, and Moist heat  PLAN FOR NEXT SESSION: Review and progress HEP; focus on exercises to lessen radicular symptoms; postural strengthening, shoulder strength, neck ROM    Thaddeus Filippo, PT, DPT 04/19/24 10:58 AM  [facility] Health Outpatient Rehab at [facility] [address] [place], [place] [address] Phone # [phone] Fax # [phone]

## 2024-04-19 ENCOUNTER — Encounter: Payer: Self-pay | Admitting: Physical Therapy

## 2024-04-19 ENCOUNTER — Ambulatory Visit: Attending: Neurology | Admitting: Physical Therapy

## 2024-04-19 DIAGNOSIS — M5412 Radiculopathy, cervical region: Secondary | ICD-10-CM | POA: Diagnosis not present

## 2024-04-19 DIAGNOSIS — M542 Cervicalgia: Secondary | ICD-10-CM | POA: Diagnosis present

## 2024-04-19 DIAGNOSIS — M6281 Muscle weakness (generalized): Secondary | ICD-10-CM | POA: Insufficient documentation

## 2024-04-19 DIAGNOSIS — R293 Abnormal posture: Secondary | ICD-10-CM | POA: Diagnosis present

## 2024-04-20 NOTE — Therapy (Incomplete)
 OUTPATIENT PHYSICAL THERAPY CERVICAL TREATMENT   Patient Name: [name] MRN: [id number] DOB:[birth year], 78 y.o., female Today's Date: 04/20/2024  END OF SESSION:         Past Medical History:  Diagnosis Date   Back pain    Falls    Fibromyalgia    Hypertension    Joint pain    Joint stiffness    Joint swelling    Pure hypercholesterolemia 01/11/2020   Raynaud's disease    age 26's   Sleep apnea    Varicose veins of both lower extremities    Vitamin B12 deficiency    2020   Vitamin D  deficiency    Past Surgical History:  Procedure Laterality Date   BREAST BIOPSY Right    BREAST EXCISIONAL BIOPSY Right    FOOT FRACTURE SURGERY Right    FRACTURE SURGERY     KNEE SURGERY Left 2007   TUBAL LIGATION     Patient Active Problem List   Diagnosis Date Noted   Numbness and tingling of left leg 12/16/2023   Numbness and tingling in left arm 12/16/2023   Breast tenderness in female 12/16/2023   COVID-19 vaccination declined 12/16/2023   Mass of upper outer quadrant of right breast 12/16/2023   Encounter for screening mammogram for malignant neoplasm of breast 12/16/2023   Claudication of lower extremity (HCC) 09/12/2023   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   Chronic kidney disease, stage II (mild) 08/08/2021   Benign hypertension with CKD (chronic kidney disease) stage III (HCC) 08/08/2021   Vitamin D  deficiency 05/29/2020   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   Class 2 severe obesity with serious comorbidity and body mass index (BMI) of 37.0 to 37.9 in adult (HCC) 05/29/2020   Blurred vision 04/19/2020   Acute nonintractable headache 04/19/2020   Essential hypertension 01/11/2020   Pure hypercholesterolemia 01/11/2020   Snorings 01/22/2018   Hypersomnia with sleep apnea 01/22/2018   Nocturia more than twice per night 01/22/2018   Obesity with body mass index (BMI) of 35.0 to 39.9 without comorbidity  01/22/2018   Varicose veins of bilateral lower extremities with other complications 06/16/2017   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  Rationale for Evaluation and Treatment: Rehabilitation  ONSET DATE: at least 6 months ago  SUBJECTIVE:  SUBJECTIVE STATEMENT: Not experiencing the issues with my arm like I was- no N/T or burning as frequently. Reports that the cupping her massage therapist is doing is helping.    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 Yes: NPRS scale: "2/10" Pain location: L shoulder, radiating into L arm Pain description: burning, numbness Aggravating factors: worse in bed at night, later in the day Relieving factors: nothing right now  PRECAUTIONS: Other: numbness in L lateral thigh preceding the LUE numbness  RED FLAGS: None     WEIGHT BEARING RESTRICTIONS: No  FALLS:  Has patient fallen in last 6 months? 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 Lead through slow rhythmic breathing to assist in muscle relaxation during DN  STM and manual TRP  L UT and LS                 Trigger Point Dry Needling  Subsequent Treatment: Instructions provided previously at initial dry needling treatment.   Patient Verbal Consent Given: Yes Education Handout Provided: Previously Provided Muscles Treated: L UT using standard technique Electrical Stimulation Performed: No Treatment Response/Outcome: tolerated well       PATIENT EDUCATION: Education details: provided edu on DN policy change- pt reported understanding and requested to move forward with DN today; reviewed post-DN instructions to reduce muscle soreness, discussed planned DC next session  Person educated: Patient Education method: Explanation, Demonstration, Tactile cues, Verbal cues, and Handouts Education comprehension: verbalized understanding    HOME EXERCISE PROGRAM Access Code: [email or web address] URL: [email or web address] Date: 03/29/2024 Prepared by: [facility] - Outpatient  Rehab - [facility]  Exercises - Seated Shoulder Shrugs  - 1 x daily - 5 x weekly - 2 sets - 10 reps - Seated Shoulder Shrug Circles AROM Backward  - 1 x daily - 5 x weekly - 2 sets - 10 reps - Seated Shoulder Shrug Circles AROM Forward  - 1 x daily - 5 x weekly - 2 sets - 10 reps - Supine Shoulder External Rotation with Resistance  - 1 x daily - 5 x weekly - 2 sets - 10 reps - Supine Shoulder Horizontal Abduction with Resistance  - 1 x daily - 5 x weekly - 2 sets - 10 reps - Seated Assisted Cervical Rotation with Towel  - 1 x daily - 5 x weekly - 2 sets - 10 reps - Cervical Extension AROM with Strap  - 1 x daily - 5 x weekly - 2 sets - 10 reps  Patient  Education - Trigger Point Dry Needling    Note: Objective measures were completed at Evaluation  unless otherwise noted.  DIAGNOSTIC FINDINGS:  NCS/EMG of the arms 01/21/2023:  Bilateral median mononeuropathies across the wrists, mild on the left and moderate on the right.  Isolated mild left ulnar sensory neuropathy  PATIENT SURVEYS:  NDI 12/50 (24%)  COGNITION: Overall cognitive status: Within functional limits for tasks assessed  SENSATION: Light touch: more intense sensation and "activating the nerve pain" on LUE  POSTURE: rounded shoulders and forward head  PALPATION: Pt tender to palpation along L Upper traps, rhomboids, levator; very tender to palpation along L occiput/subocciput area.  Noted trigger point in L upper/mid trap, which radiates pain into L UE.  CERVICAL ROM:   Active ROM A/PROM (deg) eval  Flexion 48  Extension 18 stretch + radiating into L UE  Right lateral flexion 10 tightness on L  Left lateral flexion 20 crepitus  Right rotation 44 tightness in L neck  Left rotation 36 crepitus   (Blank rows = not tested)  UPPER EXTREMITY ROM:  Active ROM Right eval Left eval  Shoulder flexion  130  Shoulder extension    Shoulder abduction  130  Shoulder adduction    Shoulder extension    Shoulder internal rotation    Shoulder external rotation    Elbow flexion    Elbow extension    Wrist flexion    Wrist extension    Wrist ulnar deviation    Wrist radial deviation    Wrist pronation    Wrist supination     (Blank rows = not tested)  UPPER EXTREMITY MMT:  MMT Right eval Left eval  Shoulder flexion 4+ 4 pain  Shoulder extension    Shoulder abduction 5 4 pain  Shoulder adduction    Shoulder extension    Shoulder internal rotation  4Limited in flex int rot  Shoulder external rotation  4  Middle trapezius    Lower trapezius    Elbow flexion  5  Elbow extension  5  Wrist flexion  5  Wrist extension  5  Wrist ulnar deviation    Wrist radial  deviation    Wrist pronation    Wrist supination    Grip strength 14# 20#   (Blank rows = not tested)    TREATMENT DATE: 03/11/2024                                                                                                                                 PATIENT EDUCATION:  Education details: PT eval results, POC, initial HEP Person educated: Patient Education method: Explanation, Demonstration, and Handouts Education comprehension: verbalized understanding and returned demonstration  HOME EXERCISE PROGRAM: Access Code: [email or web address] URL: [email or web address] Date: 03/11/2024 Prepared by: [facility] - Outpatient  Rehab - [facility]  Exercises - Seated Passive Cervical Retraction  - 1-2 x daily - 7 x weekly - 2 sets - 10 reps  Patient Education - Trigger Point Dry Needling  ASSESSMENT:  CLINICAL IMPRESSION: Patient arrived to  session with report of improved radicular symptoms in L UE from cupping that her massage therapist is doing. Patient requests continued DN services d/t reporting benefit from this modality as well and reports understanding of billing policy change. Patient tolerated DN well coupled with TPR. No complaints at end of session.   OBJECTIVE IMPAIRMENTS: decreased ROM, decreased strength, impaired flexibility, impaired sensation, impaired UE functional use, postural dysfunction, and pain.   ACTIVITY LIMITATIONS: carrying, dressing, reach over head, hygiene/grooming, and caring for others  PARTICIPATION LIMITATIONS: meal prep, cleaning, laundry, community activity, and yard work  PERSONAL FACTORS: 3+ comorbidities: See PMH above are also affecting patient's functional outcome.   REHAB POTENTIAL: Good  CLINICAL DECISION MAKING: Stable/uncomplicated  EVALUATION COMPLEXITY: Low  GOALS: Goals reviewed with patient? Yes  SHORT TERM GOALS: Target date: 04/09/2024  Patient to be independent with initial HEP. Baseline: HEP initiated Goal  status: IN PROGRESS Pt to report neck pain decrease by 50%, for improved functional mobility and ADLs. Baseline:  3/10 today; but pt reports typically between 5-10/10 Goal status:  INITIAL   LONG TERM GOALS: Target date: 04/23/2024  Patient to be independent with advanced HEP. Baseline: Not yet initiated  Goal status: IN PROGRESS  Patient to demonstrate LUE strength >/=4+/5.  Baseline: See above Goal status: IN PROGRESS  Patient to demonstrate neck ROM WFL and without pain limiting.  Baseline: see above Goal status: IN PROGRESS  Patient to report and demonstrate improved head, neck, and shoulder posture at rest and with activity.  Baseline:   Goal status: IN PROGRESS  Patient to score less than or equal to 18% on NDI for improved neck pain/less daily limitations.  Baseline: 24% Goal status: IN PROGRESS   PLAN:  PT FREQUENCY: 2x/week  PT DURATION: 6 weeks plus eval visit  PLANNED INTERVENTIONS: 97110-Therapeutic exercises, 97530- Therapeutic activity, 97112- Neuromuscular re-education, 97535- Self Care, 47425- Manual therapy, Patient/Family education, Taping, Dry Needling, Joint mobilization, and Moist heat  PLAN FOR NEXT SESSION: Review and progress HEP; focus on exercises to lessen radicular symptoms; postural strengthening, shoulder strength, neck ROM    Thaddeus Filippo, PT, DPT 04/20/24 7:44 AM  [facility] at [facility] [address] [place], [place] [address] Phone # [phone] Fax # [phone]

## 2024-04-21 ENCOUNTER — Ambulatory Visit: Admitting: Physical Therapy

## 2024-04-23 ENCOUNTER — Telehealth: Payer: Self-pay | Admitting: Pharmacist

## 2024-04-23 DIAGNOSIS — E7800 Pure hypercholesterolemia, unspecified: Secondary | ICD-10-CM

## 2024-04-23 NOTE — Progress Notes (Signed)
   04/23/2024  Patient ID: [name], female   DOB: 1946-11-17, 78 y.o.   MRN: [id number]  Pharmacy Quality Measure Review  This patient is appearing on a report for being at risk of failing the adherence measure for cholesterol (statin) medications this calendar year.   Medication: Atrovastatin 10 mg  Last fill date: 12/12/23 for 90 day supply  Will collaborate with provider to facilitate refill needs.  Patient has an upcoming appointment with her PCP on 04/28/24-will send note to Provider just prior to the appointment with a reminder to inquire about Atorvastatin  it was last filled in January.  Plan: Follow up. Las impactable date is:

## 2024-04-28 ENCOUNTER — Ambulatory Visit: Payer: Medicare HMO | Admitting: Internal Medicine

## 2024-04-28 ENCOUNTER — Telehealth: Payer: Self-pay | Admitting: Pharmacist

## 2024-04-28 ENCOUNTER — Encounter: Payer: Self-pay | Admitting: Internal Medicine

## 2024-04-28 VITALS — BP 108/62 | HR 77 | Temp 98.0°F | Ht 67.0 in

## 2024-04-28 DIAGNOSIS — E7800 Pure hypercholesterolemia, unspecified: Secondary | ICD-10-CM

## 2024-04-28 DIAGNOSIS — I129 Hypertensive chronic kidney disease with stage 1 through stage 4 chronic kidney disease, or unspecified chronic kidney disease: Secondary | ICD-10-CM

## 2024-04-28 DIAGNOSIS — R202 Paresthesia of skin: Secondary | ICD-10-CM | POA: Diagnosis not present

## 2024-04-28 DIAGNOSIS — R635 Abnormal weight gain: Secondary | ICD-10-CM | POA: Diagnosis not present

## 2024-04-28 DIAGNOSIS — N182 Chronic kidney disease, stage 2 (mild): Secondary | ICD-10-CM | POA: Diagnosis not present

## 2024-04-28 DIAGNOSIS — M1712 Unilateral primary osteoarthritis, left knee: Secondary | ICD-10-CM

## 2024-04-28 DIAGNOSIS — L601 Onycholysis: Secondary | ICD-10-CM

## 2024-04-28 MED ORDER — HYDROCHLOROTHIAZIDE 25 MG PO TABS: ORAL_TABLET | ORAL | 2 refills | Status: AC

## 2024-04-28 NOTE — Assessment & Plan Note (Signed)
 Chronic, she agrees to lipoprotein (a) testing. We also discussed use of cardiac calcium  scoring in cardiac risk assessment.  - Continue with atorvastatin , take as prescribed - Refill meds ASAP

## 2024-04-28 NOTE — Assessment & Plan Note (Signed)
 Chronic, she is encouraged to stay well hydrated, avoid NSAIDs and keep BP controlled to prevent progression of CKD.

## 2024-04-28 NOTE — Assessment & Plan Note (Signed)
 Chronic pain with bone-on-bone sensation. Considering surgery; weight loss required. - Encourage weight loss to BMI <35, target weight ~215 lbs. - Continue Voltaren  gel application on entire knee. - Evaluate for surgery post weight loss.

## 2024-04-28 NOTE — Assessment & Plan Note (Signed)
 Chronic, well controlled.   Well-controlled with lisinopril  and hydrochlorothiazide . Occasional dizziness due to inconsistent medication timing. - Continue lisinopril  and hydrochlorothiazide  daily. - Switch hydrochlorothiazide  to 90-day supply. - Encourage consistent medication timing. - Regular home blood pressure monitoring.

## 2024-04-28 NOTE — Assessment & Plan Note (Signed)
 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 Toenail splitting and lifting. - Consider clear iodine topically on affected toenails. - Monitor for toenail lifting, consider removal if necessary. - Discuss alternative treatments if available.

## 2024-04-28 NOTE — Progress Notes (Signed)
   04/28/2024  Patient ID: [name], female   DOB: [birth year], 78 y.o.   MRN: [id number]  Patient was in clinic today. PCP attempted to address Atorvastatin  in during the visit but saw where it was sent 03/22/24 and thought it was too early. Pharmacy Fill History data from Dr. Anson Basta and a physical call to the Pharmacy revealed the last filled was 12/23/23 for a 90 day supply.  Provider will send refill.  Patient was on the adherence report.  Geronimo Krabbe, PharmD, BCACP Clinical Pharmacist [phone]

## 2024-04-28 NOTE — Progress Notes (Signed)
 [place], CMA,acting as a scribe for Smiley Dung, MD.,have documented all relevant documentation on the behalf of Smiley Dung, MD,as directed by  Smiley Dung, MD while in the presence of Smiley Dung, MD.  Subjective:  Patient ID: [name] , female    DOB: Nov 21, 1945 , 78 y.o.   MRN: [id number]  Chief Complaint  Patient presents with   Hypertension    Patient presents today for a bp and chol follow up, Patient reports compliance with medication. Patient denies any chest pain, SOB, or headaches.    Toe Pain    Patient reports she has a fungus on both her big toes, patient reports her toenail is falling off.    Knee Pain    Patient reports she has left knee pain, she reports she is going to get a knee replacement hasn't followed up with ortho yet. She states she knows she needs to lose weight before her surgery. Patient did refuse to weigh today. Patient reports she doesn't exercise due to her knee pain.     HPI Discussed the use of AI scribe software for clinical note transcription with the patient, who gave verbal consent to proceed.  History of Present Illness [name] is a 78 year old female with hypertension who presents for a blood pressure check.  She takes lisinopril  and hydrochlorothiazide  daily for hypertension but occasionally forgets to take her medication. She experiences dizziness and lightheadedness at times and is inconsistent with the timing of her home blood pressure measurements.  She is on atorvastatin  for cholesterol management. There was a lapse in refilling her atorvastatin , which she last filled in January, but she believes she has been taking her medications regularly.  She experiences chronic left knee pain, described as 'bone on bone', with creaking sounds. She has received steroid and gel injections in the past, providing temporary relief for two to three weeks. She currently uses Voltaren  gel for pain management.  She has a  history of tingling sensations in her left thigh and leg, which extended to her arm. She was referred to a neurologist and underwent physical therapy with dry needling, which she found helpful. Massages have also provided significant relief.  She has a history of toenail fungus, primarily affecting her big toes, with toenail splitting. She has tried Lamisil  three times without success. Her mother also had similar issues with toenail fungus.   Hypertension This is a chronic problem. The current episode started more than 1 year ago. The problem has been gradually improving since onset. The problem is controlled. Pertinent negatives include no orthopnea or shortness of breath. Risk factors for coronary artery disease include sedentary lifestyle. The current treatment provides moderate improvement.     Past Medical History:  Diagnosis Date   Back pain    Falls    Fibromyalgia    Hypertension    Joint pain    Joint stiffness    Joint swelling    Pure hypercholesterolemia 01/11/2020   Raynaud's disease    age 60's   Sleep apnea    Varicose veins of both lower extremities    Vitamin B12 deficiency    2020   Vitamin D  deficiency      Family History  Problem Relation Age of Onset   Hypertension Mother    Hyperthyroidism Mother    Hypertension Father    Prostate cancer Father    Diabetes Brother    Breast cancer Paternal Aunt    Breast cancer  Paternal Grandmother    Peripheral vascular disease Other      Current Outpatient Medications:    acetaminophen  (TYLENOL ) 500 MG tablet, Take 1,000 mg by mouth daily as needed for moderate pain., Disp: , Rfl:    atorvastatin  (LIPITOR) 10 MG tablet, TAKE 1 TABLET(10 MG) BY MOUTH DAILY, Disp: 90 tablet, Rfl: 1   Black Pepper-Turmeric (TURMERIC COMPLEX/BLACK PEPPER PO), Take 1 tablet by mouth daily., Disp: , Rfl:    diazepam  (VALIUM ) 2 MG tablet, One tab po one hour prior to CT scan, repeat if needed, Disp: 2 tablet, Rfl: 0   diclofenac  Sodium  (VOLTAREN ) 1 % GEL, Apply topically 4 (four) times daily., Disp: , Rfl:    Doxylamine Succinate, Sleep, (SLEEP AID PO), Take 1 tablet by mouth daily. Takes half a tab at bedtime, Disp: , Rfl:    lisinopril  (ZESTRIL ) 10 MG tablet, TAKE 1 TABLET(10 MG) BY MOUTH DAILY, Disp: 90 tablet, Rfl: 1   Melatonin 1 MG CAPS, Take 1 capsule by mouth daily., Disp: , Rfl:    Multiple Vitamin (MULTIVITAMIN) capsule, Take 1 capsule by mouth daily., Disp: , Rfl:    tiZANidine  (ZANAFLEX ) 4 MG tablet, TAKE 1 TABLET(4 MG) BY MOUTH AT BEDTIME AS NEEDED FOR MUSCLE SPASMS, Disp: 30 tablet, Rfl: 0   hydrochlorothiazide  (HYDRODIURIL ) 25 MG tablet, TAKE 1 TABLET(25 MG) BY MOUTH DAILY, Disp: 90 tablet, Rfl: 2   Allergies  Allergen Reactions   Penicillins     Does not remember     Review of Systems  Constitutional: Negative.   Respiratory: Negative.  Negative for shortness of breath.   Cardiovascular: Negative.  Negative for orthopnea.  Gastrointestinal: Negative.   Musculoskeletal:  Positive for arthralgias.  Neurological: Negative.   Psychiatric/Behavioral: Negative.       Today's Vitals   04/28/24 1137  BP: 108/62  Pulse: 77  Temp: 98 F (36.7 C)  TempSrc: Oral  Height: 5' 7 (1.702 m)  PainSc: 4   PainLoc: Knee   Body mass index is 36.34 kg/m.  Wt Readings from Last 3 Encounters:  06/09/23 232 lb (105.2 kg)  05/28/23 220 lb (99.8 kg)  05/26/23 229 lb (103.9 kg)    BP Readings from Last 3 Encounters:  04/28/24 108/62  02/18/24 (!) 144/79  12/30/23 120/80     The 10-year ASCVD risk score (Arnett DK, et al., 2019) is: 8.9%   Values used to calculate the score:     Age: 60 years     Sex: Female     Is Non-Hispanic African American: Yes     Diabetic: No     Tobacco smoker: No     Systolic Blood Pressure: 108 mmHg     Is BP treated: Yes     HDL Cholesterol: 46 mg/dL     Total Cholesterol: 137 mg/dL  Objective:  Physical Exam Vitals and nursing note reviewed.  Constitutional:       Appearance: Normal appearance. She is obese.  HENT:     Head: Normocephalic and atraumatic.  Eyes:     Extraocular Movements: Extraocular movements intact.  Cardiovascular:     Rate and Rhythm: Normal rate and regular rhythm.     Heart sounds: Normal heart sounds.  Pulmonary:     Effort: Pulmonary effort is normal.     Breath sounds: Normal breath sounds.  Musculoskeletal:     Cervical back: Normal range of motion.  Feet:     Right foot:     Toenail Condition: Right toenails  are abnormally thick. Fungal disease present.    Left foot:     Toenail Condition: Left toenails are abnormally thick. Fungal disease present. Skin:    General: Skin is warm.  Neurological:     General: No focal deficit present.     Mental Status: She is alert.  Psychiatric:        Mood and Affect: Mood normal.        Behavior: Behavior normal.       Assessment And Plan:  Parenchymal renal hypertension, stage 1 through stage 4 or unspecified chronic kidney disease -     Lipid panel -     hydroCHLOROthiazide ; TAKE 1 TABLET(25 MG) BY MOUTH DAILY  Dispense: 90 tablet; Refill: 2 -     CMP14+EGFR  Chronic kidney disease, stage II (mild) Assessment & Plan: Chronic, she is encouraged to stay well hydrated, avoid NSAIDs and keep BP controlled to prevent progression of CKD.     Pure hypercholesterolemia Assessment & Plan: Chronic, she agrees to lipoprotein (a) testing. We also discussed use of cardiac calcium  scoring in cardiac risk assessment.  - Continue with atorvastatin , take as prescribed - Refill meds ASAP  Orders: -     Lipid panel -     CMP14+EGFR  Primary osteoarthritis of left knee Assessment & Plan: Chronic pain with bone-on-bone sensation. Considering surgery; weight loss required. - Encourage weight loss to BMI <35, target weight ~215 lbs. - Continue Voltaren  gel application on entire knee. - Evaluate for surgery post weight loss.   Onycholysis Assessment & Plan: Chronic toenail  fungus with ineffective previous treatments. Toenail splitting and lifting. - Consider clear iodine topically on affected toenails. - Monitor for toenail lifting, consider removal if necessary. - Discuss alternative treatments if available.   Paresthesia Assessment & Plan: Tingling in left thigh, leg, and arm. Physical therapy with dry needling effective. - Encourage continuation of physical therapy exercises. - Consider out-of-pocket dry needling if beneficial. - Explore cost options for dry needling. - She was given information on [facility] to consider laser therapy   Weight gain -     Hemoglobin A1c   Return for keep same next..  Patient was given opportunity to ask questions. Patient verbalized understanding of the plan and was able to repeat key elements of the plan. All questions were answered to their satisfaction.    I, Smiley Dung, MD, have reviewed all documentation for this visit. The documentation on 04/28/24 for the exam, diagnosis, procedures, and orders are all accurate and complete.   IF YOU HAVE BEEN REFERRED TO A SPECIALIST, IT MAY TAKE 1-2 WEEKS TO SCHEDULE/PROCESS THE REFERRAL. IF YOU HAVE NOT HEARD FROM US /SPECIALIST IN TWO WEEKS, PLEASE GIVE US  A CALL AT [phone] X [phone].

## 2024-04-28 NOTE — Assessment & Plan Note (Signed)
 Tingling in left thigh, leg, and arm. Physical therapy with dry needling effective. - Encourage continuation of physical therapy exercises. - Consider out-of-pocket dry needling if beneficial. - Explore cost options for dry needling. - She was given information on [facility] to consider laser therapy

## 2024-04-29 LAB — HEMOGLOBIN A1C
Est. average glucose Bld gHb Est-mCnc: 117 mg/dL
Hgb A1c MFr Bld: 5.7 % — ABNORMAL HIGH (ref 4.8–5.6)

## 2024-04-29 LAB — CMP14+EGFR
ALT: 17 IU/L (ref 0–32)
AST: 23 IU/L (ref 0–40)
Albumin: 4.3 g/dL (ref 3.8–4.8)
Alkaline Phosphatase: 85 IU/L (ref 44–121)
BUN/Creatinine Ratio: 23 (ref 12–28)
BUN: 27 mg/dL (ref 8–27)
Bilirubin Total: 0.4 mg/dL (ref 0.0–1.2)
CO2: 22 mmol/L (ref 20–29)
Calcium: 9.4 mg/dL (ref 8.7–10.3)
Chloride: 103 mmol/L (ref 96–106)
Creatinine, Ser: 1.16 mg/dL — ABNORMAL HIGH (ref 0.57–1.00)
Globulin, Total: 2.8 g/dL (ref 1.5–4.5)
Glucose: 82 mg/dL (ref 70–99)
Potassium: 5 mmol/L (ref 3.5–5.2)
Sodium: 141 mmol/L (ref 134–144)
Total Protein: 7.1 g/dL (ref 6.0–8.5)
eGFR: 49 mL/min/{1.73_m2} — ABNORMAL LOW (ref >59)

## 2024-04-29 LAB — LIPID PANEL
Chol/HDL Ratio: 2.8 ratio (ref 0.0–4.4)
Cholesterol, Total: 161 mg/dL (ref 100–199)
HDL: 58 mg/dL (ref >39)
LDL Chol Calc (NIH): 86 mg/dL (ref 0–99)
Triglycerides: 94 mg/dL (ref 0–149)
VLDL Cholesterol Cal: 17 mg/dL (ref 5–40)

## 2024-05-02 ENCOUNTER — Ambulatory Visit: Payer: Self-pay | Admitting: Internal Medicine

## 2024-05-11 ENCOUNTER — Other Ambulatory Visit: Payer: Self-pay | Admitting: Internal Medicine

## 2024-05-11 DIAGNOSIS — M542 Cervicalgia: Secondary | ICD-10-CM

## 2024-06-02 ENCOUNTER — Ambulatory Visit: Payer: Medicare HMO

## 2024-06-02 VITALS — BP 118/60 | HR 93 | Temp 97.9°F | Ht 67.0 in

## 2024-06-02 DIAGNOSIS — Z0000 Encounter for general adult medical examination without abnormal findings: Secondary | ICD-10-CM

## 2024-06-02 NOTE — Progress Notes (Signed)
 Subjective:   [name] is a 78 y.o. who presents for a Medicare Wellness preventive visit.  As a reminder, Annual Wellness Visits don't include a physical exam, and some assessments may be limited, especially if this visit is performed virtually. We may recommend an in-person follow-up visit with your provider if needed.  Visit Complete: In person    Persons Participating in Visit: Patient.  AWV Questionnaire: No: Patient Medicare AWV questionnaire was not completed prior to this visit.  Cardiac Risk Factors include: advanced age (>42men, >5 women);hypertension     Objective:    Today's Vitals   06/02/24 1540 06/02/24 1541  BP: 118/60   Pulse: 93   Temp: 97.9 F (36.6 C)   TempSrc: Oral   SpO2: 98%   Height: 5' 7 (1.702 m)   PainSc:  3    Body mass index is 36.34 kg/m.     06/02/2024    3:50 PM 03/11/2024   10:22 AM 02/18/2024    3:07 PM 05/28/2023    4:42 PM 05/15/2022    9:21 AM 11/28/2021   10:55 PM 04/19/2021   10:24 AM  Advanced Directives  Does Patient Have a Medical Advance Directive? No No No No No No No  Would patient like information on creating a medical advance directive? No - Patient declined No - Patient declined   No - Patient declined  No - Patient declined    Current Medications (verified) Outpatient Encounter Medications as of 06/02/2024  Medication Sig   acetaminophen  (TYLENOL ) 500 MG tablet Take 1,000 mg by mouth daily as needed for moderate pain.   atorvastatin  (LIPITOR) 10 MG tablet TAKE 1 TABLET(10 MG) BY MOUTH DAILY   Black Pepper-Turmeric (TURMERIC COMPLEX/BLACK PEPPER PO) Take 1 tablet by mouth daily.   diclofenac  Sodium (VOLTAREN ) 1 % GEL Apply topically 4 (four) times daily.   hydrochlorothiazide  (HYDRODIURIL ) 25 MG tablet TAKE 1 TABLET(25 MG) BY MOUTH DAILY   lisinopril  (ZESTRIL ) 10 MG tablet TAKE 1 TABLET(10 MG) BY MOUTH DAILY   Multiple Vitamin (MULTIVITAMIN) capsule Take 1 capsule by mouth daily.   diazepam  (VALIUM ) 2 MG  tablet One tab po one hour prior to CT scan, repeat if needed (Patient not taking: Reported on 06/02/2024)   Doxylamine Succinate, Sleep, (SLEEP AID PO) Take 1 tablet by mouth daily. Takes half a tab at bedtime (Patient not taking: Reported on 06/02/2024)   Melatonin 1 MG CAPS Take 1 capsule by mouth daily. (Patient not taking: Reported on 06/02/2024)   tiZANidine  (ZANAFLEX ) 4 MG tablet TAKE 1 TABLET(4 MG) BY MOUTH AT BEDTIME AS NEEDED FOR MUSCLE SPASMS (Patient not taking: Reported on 06/02/2024)   No facility-administered encounter medications on file as of 06/02/2024.    Allergies (verified) Penicillins   History: Past Medical History:  Diagnosis Date   Back pain    Falls    Fibromyalgia    Hypertension    Joint pain    Joint stiffness    Joint swelling    Pure hypercholesterolemia 01/11/2020   Raynaud's disease    age 55's   Sleep apnea    Varicose veins of both lower extremities    Vitamin B12 deficiency    2020   Vitamin D  deficiency    Past Surgical History:  Procedure Laterality Date   BREAST BIOPSY Right    BREAST EXCISIONAL BIOPSY Right    CARPAL TUNNEL RELEASE Right    2024   FOOT FRACTURE SURGERY Right    FRACTURE SURGERY  KNEE SURGERY Left 2007   TUBAL LIGATION     Family History  Problem Relation Age of Onset   Hypertension Mother    Hyperthyroidism Mother    Hypertension Father    Prostate cancer Father    Diabetes Brother    Breast cancer Paternal Aunt    Breast cancer Paternal Grandmother    Peripheral vascular disease Other    Social History   Socioeconomic History   Marital status: Divorced    Spouse name: Not on file   Number of children: Not on file   Years of education: Not on file   Highest education level: Not on file  Occupational History   Not on file  Tobacco Use   Smoking status: Former   Smokeless tobacco: Never   Tobacco comments:    quit at age 18,only smoked for 1-2 years  Vaping Use   Vaping status: Never Used   Substance and Sexual Activity   Alcohol use: Yes    Comment: 2 drinks weekly   Drug use: No   Sexual activity: Not Currently  Other Topics Concern   Not on file  Social History Narrative   Are you right handed or left handed? Right Handed   Are you currently employed ? 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 Yes    Who lives with you?    What type of home do you live in: 1 story or 2 story?  Lives in a 3 story home        Social Drivers of Health   Financial Resource Strain: Low Risk  (06/02/2024)   Overall Financial Resource Strain (CARDIA)    Difficulty of Paying Living Expenses: Not hard at all  Food Insecurity: No Food Insecurity (06/02/2024)   Hunger Vital Sign    Worried About Running Out of Food in the Last Year: Never true    Ran Out of Food in the Last Year: Never true  Transportation Needs: No Transportation Needs (06/02/2024)   PRAPARE - Administrator, Civil Service (Medical): No    Lack of Transportation (Non-Medical): No  Physical Activity: Inactive (06/02/2024)   Exercise Vital Sign    Days of Exercise per Week: 0 days    Minutes of Exercise per Session: 0 min  Stress: No Stress Concern Present (06/02/2024)   [employer] of Occupational Health - Occupational Stress Questionnaire    Feeling of Stress: Not at all  Social Connections: Moderately Isolated (06/02/2024)   Social Connection and Isolation Panel    Frequency of Communication with Friends and Family: More than three times a week    Frequency of Social Gatherings with Friends and Family: Three times a week    Attends Religious Services: Never    Active Member of Clubs or Organizations: Yes    Attends Engineer, structural: More than 4 times per year    Marital Status: Divorced    Tobacco Counseling Counseling given: Not Answered Tobacco comments: quit at age 18,only smoked for 1-2 years    Clinical Intake:  Pre-visit  preparation completed: Yes  Pain : 0-10 Pain Score: 3  Pain Type: Chronic pain Pain Location: Generalized Pain Descriptors / Indicators: Aching Pain Onset: More than a month ago Pain Frequency: Constant     Nutritional Risks: None Diabetes: No  Lab Results  Component Value Date   HGBA1C 5.7 (H) 04/28/2024   HGBA1C 5.4 08/08/2021  HGBA1C 5.6 07/20/2020     How often do you need to have someone help you when you read instructions, pamphlets, or other written materials from your doctor or pharmacy?: 1 - Never  Interpreter Needed?: No  Information entered by :: NAllen LPN   Activities of Daily Living     06/02/2024    3:42 PM  In your present state of health, do you have any difficulty performing the following activities:  Hearing? 0  Vision? 1  Comment watery eyes at times, macular degeneration  Difficulty concentrating or making decisions? 0  Walking or climbing stairs? 1  Dressing or bathing? 0  Doing errands, shopping? 0  Preparing Food and eating ? N  Using the Toilet? N  In the past six months, have you accidently leaked urine? N  Do you have problems with loss of bowel control? N  Managing your Medications? N  Managing your Finances? N  Housekeeping or managing your Housekeeping? N    Patient Care Team: Jarold Medici, MD as PCP - General (Internal Medicine) Raford Riggs, MD as PCP - Cardiology (Cardiology) Armond Cape, MD as Consulting Physician (Obstetrics and Gynecology) Patel, Donika K, DO as Consulting Physician (Neurology)  I have updated your Care Teams any recent Medical Services you may have received from other providers in the past year.     Assessment:   This is a routine wellness examination for [name].  Hearing/Vision screen Hearing Screening - Comments:: Denies hearing issues Vision Screening - Comments:: Regular eye exams, Dr. Tobie   Goals Addressed             This Visit's Progress    Patient Stated        06/02/2024, take time for self, lose weight so she can get knee surgery       Depression Screen     06/02/2024    3:50 PM 09/12/2023    9:55 AM 05/28/2023    4:43 PM 12/18/2022    9:38 AM 05/15/2022    9:23 AM 04/19/2021   10:24 AM 07/20/2020   11:03 AM  PHQ 2/9 Scores  PHQ - 2 Score 1 0 0 0 0 0 0  PHQ- 9 Score  0 1        Fall Risk     06/02/2024    3:50 PM 02/18/2024    3:06 PM 05/28/2023    4:42 PM 12/18/2022    9:38 AM 05/15/2022    9:23 AM  Fall Risk   Falls in the past year? 0 0 0 0 0  Number falls in past yr: 0 0 0 0 0  Injury with Fall? 0 0 0 0 0  Risk for fall due to : Medication side effect  Medication side effect;Impaired balance/gait No Fall Risks Medication side effect  Follow up Falls evaluation completed;Falls prevention discussed Falls evaluation completed Falls prevention discussed;Falls evaluation completed Falls evaluation completed Falls evaluation completed;Education provided;Falls prevention discussed      Data saved with a previous flowsheet row definition    MEDICARE RISK AT HOME:  Medicare Risk at Home Any stairs in or around the home?: Yes If so, are there any without handrails?: No Home free of loose throw rugs in walkways, pet beds, electrical cords, etc?: Yes Adequate lighting in your home to reduce risk of falls?: Yes Life alert?: No Use of a cane, walker or w/c?: No Grab bars in the bathroom?: No Shower chair or bench in shower?: No Elevated toilet seat or a  handicapped toilet?: Yes  TIMED UP AND GO:  Was the test performed?  Yes  Length of time to ambulate 10 feet: 5 sec Gait steady and fast without use of assistive device  Cognitive Function: 6CIT completed        06/02/2024    3:51 PM 05/28/2023    4:44 PM 05/15/2022    9:24 AM 07/20/2020   11:05 AM 07/15/2019   10:16 AM  6CIT Screen  What Year? 0 points 0 points 0 points 0 points 0 points  What month? 0 points 0 points 0 points 0 points 0 points  What time? 0 points 0 points 0 points 0  points 0 points  Count back from 20 0 points 0 points 0 points 0 points 0 points  Months in reverse 0 points 0 points 0 points 0 points 0 points  Repeat phrase 2 points 2 points 0 points 0 points 0 points  Total Score 2 points 2 points 0 points 0 points 0 points    Immunizations Immunization History  Administered Date(s) Administered   DTaP 06/10/2013   Fluad Quad(high Dose 65+) 07/26/2020, 08/08/2021, 08/20/2022   Fluad Trivalent(High Dose 65+) 09/12/2023   Influenza Inj Mdck Quad Pf 07/21/2019   Influenza, High Dose Seasonal PF 09/11/2018   Influenza-Unspecified 07/21/2019   PFIZER Comirnaty(Gray Top)Covid-19 Tri-Sucrose Vaccine 06/11/2021, 10/07/2022   PFIZER(Purple Top)SARS-COV-2 Vaccination 12/23/2019, 01/13/2020, 08/14/2020   Pfizer Covid-19 Vaccine Bivalent Booster 56yrs & up 12/10/2021   Pfizer(Comirnaty)Fall Seasonal Vaccine 12 years and older 10/07/2022   Pneumococcal Conjugate-13 02/01/2019, 02/02/2019   Pneumococcal Polysaccharide-23 11/28/2021   Pneumococcal-Unspecified 06/10/2013   Tdap 02/16/2018   Zoster Recombinant(Shingrix ) 05/17/2021, 11/28/2021    Screening Tests Health Maintenance  Topic Date Due   COVID-19 Vaccine (7 - 2024-25 season) 06/18/2024 (Originally 07/20/2023)   INFLUENZA VACCINE  06/18/2024   Medicare Annual Wellness (AWV)  06/02/2025   DTaP/Tdap/Td (3 - Td or Tdap) 02/17/2028   Pneumococcal Vaccine: 50+ Years  Completed   DEXA SCAN  Completed   Hepatitis C Screening  Completed   Zoster Vaccines- Shingrix   Completed   Hepatitis B Vaccines  Aged Out   HPV VACCINES  Aged Out   Meningococcal B Vaccine  Aged Out   Colonoscopy  Discontinued    Health Maintenance  There are no preventive care reminders to display for this patient.  Health Maintenance Items Addressed: Up to date  Additional Screening:  Vision Screening: Recommended annual ophthalmology exams for early detection of glaucoma and other disorders of the eye. Would you like a  referral to an eye doctor? No    Dental Screening: Recommended annual dental exams for proper oral hygiene  Community Resource Referral / Chronic Care Management: CRR required this visit?  No   CCM required this visit?  No   Plan:    I have personally reviewed and noted the following in the patient's chart:   Medical and social history Use of alcohol, tobacco or illicit drugs  Current medications and supplements including opioid prescriptions. Patient is not currently taking opioid prescriptions. Functional ability and status Nutritional status Physical activity Advanced directives List of other physicians Hospitalizations, surgeries, and ER visits in previous 12 months Vitals Screenings to include cognitive, depression, and falls Referrals and appointments  In addition, I have reviewed and discussed with patient certain preventive protocols, quality metrics, and best practice recommendations. A written personalized care plan for preventive services as well as general preventive health recommendations were provided to patient.   Zamariya Neal E  Dasie, LPN   [id number]   After Visit Summary: (In Person-Printed) AVS printed and given to the patient  Notes: Nothing significant to report at this time.

## 2024-06-02 NOTE — Patient Instructions (Signed)
 [name] , Thank you for taking time out of your busy schedule to complete your Annual Wellness Visit with me. I enjoyed our conversation and look forward to speaking with you again next year. I, as well as your care team,  appreciate your ongoing commitment to your health goals. Please review the following plan we discussed and let me know if I can assist you in the future. Your Game plan/ To Do List    Referrals: If you haven't heard from the office you've been referred to, please reach out to them at the phone provided.  N/a Follow up Visits: Next Medicare AWV with our clinical staff: office will schedule   Have you seen your provider in the last 6 months (3 months if uncontrolled diabetes)? Yes Next Office Visit with your provider: 09/15/2024 at 10:00  Clinician Recommendations:  Aim for 30 minutes of exercise or brisk walking, 6-8 glasses of water, and 5 servings of fruits and vegetables each day.       This is a list of the screening recommended for you and due dates:  Health Maintenance  Topic Date Due   COVID-19 Vaccine (7 - 2024-25 season) 06/18/2024*   Flu Shot  06/18/2024   Medicare Annual Wellness Visit  06/02/2025   DTaP/Tdap/Td vaccine (3 - Td or Tdap) 02/17/2028   Pneumococcal Vaccine for age over 28  Completed   DEXA scan (bone density measurement)  Completed   Hepatitis C Screening  Completed   Zoster (Shingles) Vaccine  Completed   Hepatitis B Vaccine  Aged Out   HPV Vaccine  Aged Out   Meningitis B Vaccine  Aged Out   Colon Cancer Screening  Discontinued  *Topic was postponed. The date shown is not the original due date.    Advanced directives: (Declined) Advance directive discussed with you today. Even though you declined this today, please call our office should you change your mind, and we can give you the proper paperwork for you to fill out. Advance Care Planning is important because it:  [x]  Makes sure you receive the medical care that is consistent with  your values, goals, and preferences  [x]  It provides guidance to your family and loved ones and reduces their decisional burden about whether or not they are making the right decisions based on your wishes.  Follow the link provided in your after visit summary or read over the paperwork we have mailed to you to help you started getting your Advance Directives in place. If you need assistance in completing these, please reach out to us  so that we can help you!  See attachments for Preventive Care and Fall Prevention Tips.

## 2024-06-14 DIAGNOSIS — H35372 Puckering of macula, left eye: Secondary | ICD-10-CM | POA: Diagnosis not present

## 2024-06-14 DIAGNOSIS — H35342 Macular cyst, hole, or pseudohole, left eye: Secondary | ICD-10-CM | POA: Diagnosis not present

## 2024-06-14 DIAGNOSIS — H35723 Serous detachment of retinal pigment epithelium, bilateral: Secondary | ICD-10-CM | POA: Diagnosis not present

## 2024-06-14 DIAGNOSIS — H353131 Nonexudative age-related macular degeneration, bilateral, early dry stage: Secondary | ICD-10-CM | POA: Diagnosis not present

## 2024-06-14 DIAGNOSIS — H35363 Drusen (degenerative) of macula, bilateral: Secondary | ICD-10-CM | POA: Diagnosis not present

## 2024-06-15 ENCOUNTER — Other Ambulatory Visit: Payer: Self-pay | Admitting: Internal Medicine

## 2024-06-15 DIAGNOSIS — M542 Cervicalgia: Secondary | ICD-10-CM

## 2024-06-22 ENCOUNTER — Ambulatory Visit: Admitting: Neurology

## 2024-08-03 ENCOUNTER — Ambulatory Visit: Admitting: Neurology

## 2024-09-15 ENCOUNTER — Ambulatory Visit: Payer: Self-pay | Admitting: Internal Medicine

## 2024-09-15 ENCOUNTER — Encounter: Payer: Self-pay | Admitting: Internal Medicine

## 2024-09-15 VITALS — BP 118/78 | HR 71 | Temp 97.8°F | Ht 67.0 in

## 2024-09-15 DIAGNOSIS — H6122 Impacted cerumen, left ear: Secondary | ICD-10-CM

## 2024-09-15 DIAGNOSIS — R202 Paresthesia of skin: Secondary | ICD-10-CM | POA: Diagnosis not present

## 2024-09-15 DIAGNOSIS — Z6836 Body mass index (BMI) 36.0-36.9, adult: Secondary | ICD-10-CM

## 2024-09-15 DIAGNOSIS — M79601 Pain in right arm: Secondary | ICD-10-CM | POA: Diagnosis not present

## 2024-09-15 DIAGNOSIS — M2550 Pain in unspecified joint: Secondary | ICD-10-CM | POA: Diagnosis not present

## 2024-09-15 DIAGNOSIS — E66812 Obesity, class 2: Secondary | ICD-10-CM

## 2024-09-15 DIAGNOSIS — R7303 Prediabetes: Secondary | ICD-10-CM | POA: Diagnosis not present

## 2024-09-15 DIAGNOSIS — Z23 Encounter for immunization: Secondary | ICD-10-CM

## 2024-09-15 DIAGNOSIS — E7800 Pure hypercholesterolemia, unspecified: Secondary | ICD-10-CM

## 2024-09-15 DIAGNOSIS — M79602 Pain in left arm: Secondary | ICD-10-CM

## 2024-09-15 DIAGNOSIS — R7309 Other abnormal glucose: Secondary | ICD-10-CM

## 2024-09-15 DIAGNOSIS — M542 Cervicalgia: Secondary | ICD-10-CM

## 2024-09-15 DIAGNOSIS — N182 Chronic kidney disease, stage 2 (mild): Secondary | ICD-10-CM

## 2024-09-15 DIAGNOSIS — I129 Hypertensive chronic kidney disease with stage 1 through stage 4 chronic kidney disease, or unspecified chronic kidney disease: Secondary | ICD-10-CM | POA: Diagnosis not present

## 2024-09-15 DIAGNOSIS — Z0000 Encounter for general adult medical examination without abnormal findings: Secondary | ICD-10-CM

## 2024-09-15 DIAGNOSIS — L601 Onycholysis: Secondary | ICD-10-CM

## 2024-09-15 LAB — POCT URINALYSIS DIP (CLINITEK)
Bilirubin, UA: NEGATIVE
Blood, UA: NEGATIVE
Glucose, UA: NEGATIVE mg/dL
Ketones, POC UA: NEGATIVE mg/dL
Leukocytes, UA: NEGATIVE
Nitrite, UA: NEGATIVE
POC PROTEIN,UA: NEGATIVE
Spec Grav, UA: >=1.03 — AB (ref 1.010–1.025)
Urobilinogen, UA: 0.2 U/dL
pH, UA: 5.5 (ref 5.0–8.0)

## 2024-09-15 NOTE — Patient Instructions (Signed)

## 2024-09-15 NOTE — Progress Notes (Signed)
 I,Victoria T Emmitt, CMA,acting as a neurosurgeon for Catheryn LOISE Slocumb, MD.,have documented all relevant documentation on the behalf of Catheryn LOISE Slocumb, MD,as directed by  Catheryn LOISE Slocumb, MD while in the presence of Catheryn LOISE Slocumb, MD.  Subjective:    Patient ID: [name] , female    DOB: 30-Apr-1946 , 78 y.o.   MRN: [id number]  Chief Complaint  Patient presents with   Annual Exam    Patient presents today  for her annual exam. She is no longer followed by GYN. She reports compliance with meds. Denies headaches, chest pain and shortness of breath.  Patient declined weight to be taken today. She would like discuss possible arthritis in both hands. Along with general aching all over the body. She also experiencing tightness & soreness on the right side of her hip/buttock.    Hypertension   Hyperlipidemia    HPI Discussed the use of AI scribe software for clinical note transcription with the patient, who gave verbal consent to proceed.  History of Present Illness [name] is a 78 year old female with osteoarthritis who presents for a physical exam.  She is concerned about increased joint pain and difficulty gripping objects.  She has experienced worsening osteoarthritis symptoms, particularly in her hands, with significant pain and difficulty gripping objects, leading to frequent dropping of items. Her right hand, which previously underwent carpal tunnel surgery, feels as though no surgery was performed, and she is now experiencing similar issues in her left hand. She describes a generalized aching sensation throughout her body, which sometimes makes walking difficult.  She is currently taking atorvastatin , lisinopril , and hydrochlorothiazide , although there are discrepancies in her medication refill history. She claims to be taking atorvastatin  daily despite reports indicating otherwise. She also mentions transferring medications into smaller containers for convenience.  She has  a history of mild sleep apnea. She reports sleeping on her side regularly and occasionally waking up on her back.  She attempted to participate in a clinical study for knee replacement but was ineligible due to weight gain. She wants to lose weight.  She recently underwent a CT scan of her heart and lungs, which was part of a promotional event. She describes the experience as a sales pitch for preventative health measures.  She had this at [facility].   No family history of thyroid  cancer. She reports no current use of NSAIDs like Motrin  or Advil . She mentions a previous consultation with a kidney specialist, who found no intrinsic kidney issues.  She is currently taking care of her mother, which she describes as a blessing but also tiring. She consumes one cup of caffeine daily.    Hypertension This is a chronic problem. The current episode started more than 1 year ago. The problem has been gradually improving since onset. The problem is controlled. Associated symptoms include neck pain. Pertinent negatives include no orthopnea or shortness of breath. Risk factors for coronary artery disease include sedentary lifestyle. The current treatment provides moderate improvement.     Past Medical History:  Diagnosis Date   Back pain    Falls    Fibromyalgia    Hypertension    Joint pain    Joint stiffness    Joint swelling    Pure hypercholesterolemia 01/11/2020   Raynaud's disease    age 78's   Sleep apnea    Varicose veins of both lower extremities    Vitamin B12 deficiency    2020   Vitamin D  deficiency  Family History  Problem Relation Age of Onset   Hypertension Mother    Hyperthyroidism Mother    Hypertension Father    Prostate cancer Father    Diabetes Brother    Breast cancer Paternal Aunt    Breast cancer Paternal Grandmother    Peripheral vascular disease Other      Current Outpatient Medications:    acetaminophen  (TYLENOL ) 500 MG tablet, Take 1,000 mg by  mouth daily as needed for moderate pain., Disp: , Rfl:    atorvastatin  (LIPITOR) 10 MG tablet, TAKE 1 TABLET(10 MG) BY MOUTH DAILY, Disp: 90 tablet, Rfl: 1   Black Pepper-Turmeric (TURMERIC COMPLEX/BLACK PEPPER PO), Take 1 tablet by mouth daily., Disp: , Rfl:    diclofenac  Sodium (VOLTAREN ) 1 % GEL, Apply topically 4 (four) times daily., Disp: , Rfl:    hydrochlorothiazide  (HYDRODIURIL ) 25 MG tablet, TAKE 1 TABLET(25 MG) BY MOUTH DAILY, Disp: 90 tablet, Rfl: 2   lisinopril  (ZESTRIL ) 10 MG tablet, TAKE 1 TABLET(10 MG) BY MOUTH DAILY, Disp: 90 tablet, Rfl: 1   Multiple Vitamin (MULTIVITAMIN) capsule, Take 1 capsule by mouth daily., Disp: , Rfl:    tiZANidine  (ZANAFLEX ) 4 MG tablet, TAKE 1 TABLET(4 MG) BY MOUTH AT BEDTIME AS NEEDED FOR MUSCLE SPASMS, Disp: 30 tablet, Rfl: 0   diazepam  (VALIUM ) 2 MG tablet, One tab po one hour prior to CT scan, repeat if needed (Patient not taking: Reported on 09/15/2024), Disp: 2 tablet, Rfl: 0   Doxylamine Succinate, Sleep, (SLEEP AID PO), Take 1 tablet by mouth daily. Takes half a tab at bedtime (Patient not taking: Reported on 09/15/2024), Disp: , Rfl:    Melatonin 1 MG CAPS, Take 1 capsule by mouth daily. (Patient not taking: Reported on 09/15/2024), Disp: , Rfl:    Allergies  Allergen Reactions   Penicillins     Does not remember      The patient states she uses post menopausal status for birth control. No LMP recorded. Patient is postmenopausal.. Negative for Dysmenorrhea. Negative for: breast discharge, breast lump(s), breast pain and breast self exam. Associated symptoms include abnormal vaginal bleeding. Pertinent negatives include abnormal bleeding (hematology), anxiety, decreased libido, depression, difficulty falling sleep, dyspareunia, history of infertility, nocturia, sexual dysfunction, sleep disturbances, urinary incontinence, urinary urgency, vaginal discharge and vaginal itching. Diet regular.The patient states her exercise level is intermittent.    . The patient's tobacco use is:  Social History   Tobacco Use  Smoking Status Former  Smokeless Tobacco Never  Tobacco Comments   quit at age 89,only smoked for 1-2 years  . She has been exposed to passive smoke. The patient's alcohol use is:  Social History   Substance and Sexual Activity  Alcohol Use Yes   Comment: 2 drinks weekly     Review of Systems  Constitutional: Negative.   HENT: Negative.    Eyes: Negative.   Respiratory: Negative.  Negative for shortness of breath.   Cardiovascular: Negative.  Negative for orthopnea.  Gastrointestinal: Negative.   Endocrine: Negative.   Genitourinary: Negative.   Musculoskeletal:  Positive for arthralgias and neck pain.  Skin: Negative.   Allergic/Immunologic: Negative.   Neurological: Negative.   Hematological: Negative.   Psychiatric/Behavioral: Negative.       Today's Vitals   09/15/24 1004  BP: 118/78  Pulse: 71  Temp: 97.8 F (36.6 C)  SpO2: 98%  Height: 5' 7 (1.702 m)   Body mass index is 36.34 kg/m.  Wt Readings from Last 3 Encounters:  06/09/23 232 lb (  105.2 kg)  05/28/23 220 lb (99.8 kg)  05/26/23 229 lb (103.9 kg)     Objective:  Physical Exam Constitutional:      Appearance: Normal appearance. She is obese.  HENT:     Head: Normocephalic and atraumatic.     Right Ear: Tympanic membrane, ear canal and external ear normal. There is no impacted cerumen.     Left Ear: Ear canal and external ear normal. There is impacted cerumen.     Mouth/Throat:     Pharynx: No oropharyngeal exudate or posterior oropharyngeal erythema.  Eyes:     Extraocular Movements: Extraocular movements intact.     Pupils: Pupils are equal, round, and reactive to light.  Cardiovascular:     Rate and Rhythm: Normal rate and regular rhythm.     Pulses: Normal pulses.     Heart sounds: Normal heart sounds.  Pulmonary:     Effort: Pulmonary effort is normal.     Breath sounds: Normal breath sounds.  Chest:  Breasts:    Tanner  Score is 5.     Right: Normal.     Left: Normal.  Abdominal:     General: Bowel sounds are normal.     Palpations: Abdomen is soft.  Genitourinary:    Comments: Deferred  Musculoskeletal:        General: Swelling and tenderness present. Normal range of motion.     Cervical back: Normal range of motion and neck supple.  Skin:    General: Skin is warm and dry.  Neurological:     General: No focal deficit present.     Mental Status: She is alert and oriented to person, place, and time. Mental status is at baseline.  Psychiatric:        Mood and Affect: Mood normal.         Assessment And Plan:     Encounter for general adult medical examination w/o abnormal findings Assessment & Plan: A full exam was performed.  Importance of monthly self breast exams was discussed with the patient.  She is advised to get 30-45 minutes of regular exercise, no less than four to five days per week. Both weight-bearing and aerobic exercises are recommended.  She is advised to follow a healthy diet with at least six fruits/veggies per day, decrease intake of red meat and other saturated fats and to increase fish intake to twice weekly.  Meats/fish should not be fried -- baked, boiled or broiled is preferable. It is also important to cut back on your sugar intake.  Be sure to read labels - try to avoid anything with added sugar, high fructose corn syrup or other sweeteners.  If you must use a sweetener, you can try stevia or monkfruit.  It is also important to avoid artificially sweetened foods/beverages and diet drinks. Lastly, wear SPF 50 sunscreen on exposed skin and when in direct sunlight for an extended period of time.  Be sure to avoid fast food restaurants and aim for at least 60 ounces of water daily.       Parenchymal renal hypertension, stage 1 through stage 4 or unspecified chronic kidney disease Assessment & Plan: Chronic, well controlled.  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   Well-controlled with lisinopril  and hydrochlorothiazide . - Continue lisinopril  and hydrochlorothiazide  daily. - Switch hydrochlorothiazide  to 90-day supply. - Encourage consistent medication timing. - Regular home blood pressure monitoring. - Follow low sodium diet.  - Folllow up in six months.   Orders: -  CBC -     CMP14+EGFR -     Lipid panel -     POCT URINALYSIS DIP (CLINITEK) -     Microalbumin / creatinine urine ratio -     EKG 12-Lead  Chronic kidney disease, stage II (mild) Assessment & Plan: Chronic, she is encouraged to stay well hydrated, avoid NSAIDs and keep BP controlled to prevent progression of CKD.     Pure hypercholesterolemia Assessment & Plan: Hypercholesterolemia managed with atorvastatin . Compliance with medication is questioned. - Continue atorvastatin . - Encourage medication compliance. - Consider rechecking cholesterol levels if medication adherence changes.   Polyarthralgia Assessment & Plan: Chronic osteoarthritis with worsening hand pain and recurrent carpal tunnel symptoms. Possible autoimmune arthritis considered. - Order autoimmune arthritis panel. - Refer to hand specialist for evaluation of recurrent carpal tunnel symptoms. - Recommend adding cinnamon to diet for anti-inflammatory benefits.  Orders: -     ANA, IFA (with reflex) -     CYCLIC CITRUL PEPTIDE ANTIBODY, IGG/IGA -     Rheumatoid factor -     Sedimentation rate -     Uric acid  Left ear impacted cerumen Assessment & Plan: Impacted cerumen in the left ear requiring removal. - Perform ear irrigation to remove impacted cerumen.  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  - Consider NCS   Prediabetes Assessment & Plan: Previous labs reviewed, her A1c has been elevated in the past. I will check an A1c today. Reminded to avoid refined sugars including sugary drinks/foods  and processed meats including bacon, sausages and deli meats.    Orders: -     Hemoglobin A1c  Class 2 severe obesity due to excess calories with serious comorbidity and body mass index (BMI) of 36.0 to 36.9 in adult Assessment & Plan: She is encouraged to strive for BMI less than 30 to decrease cardiac risk. Advised to aim for at least 150 minutes of exercise per week.  Obesity with recent weight gain impacting clinical study eligibility. Weight loss crucial for health improvement. - Encourage weight loss through diet and exercise. - Discuss potential benefits of weight loss on sleep apnea and overall health.    Immunization due -     Flu vaccine HIGH DOSE PF(Fluzone Trivalent)   Return for 1 year physical, 6 month bp. Patient was given opportunity to ask questions. Patient verbalized understanding of the plan and was able to repeat key elements of the plan. All questions were answered to their satisfaction.    I, Catheryn LOISE Slocumb, MD, have reviewed all documentation for this visit. The documentation on 09/15/24 for the exam, diagnosis, procedures, and orders are all accurate and complete.

## 2024-09-17 LAB — CMP14+EGFR
ALT: 24 IU/L (ref 0–32)
AST: 29 IU/L (ref 0–40)
Albumin: 4.2 g/dL (ref 3.8–4.8)
Alkaline Phosphatase: 82 IU/L (ref 49–135)
BUN/Creatinine Ratio: 20 (ref 12–28)
BUN: 19 mg/dL (ref 8–27)
Bilirubin Total: 0.5 mg/dL (ref 0.0–1.2)
CO2: 22 mmol/L (ref 20–29)
Calcium: 9.6 mg/dL (ref 8.7–10.3)
Chloride: 104 mmol/L (ref 96–106)
Creatinine, Ser: 0.96 mg/dL (ref 0.57–1.00)
Globulin, Total: 2.6 g/dL (ref 1.5–4.5)
Glucose: 90 mg/dL (ref 70–99)
Potassium: 4.4 mmol/L (ref 3.5–5.2)
Sodium: 141 mmol/L (ref 134–144)
Total Protein: 6.8 g/dL (ref 6.0–8.5)
eGFR: 61 mL/min/1.73 (ref >59)

## 2024-09-17 LAB — CBC
Hematocrit: 39.8 % (ref 34.0–46.6)
Hemoglobin: 12.6 g/dL (ref 11.1–15.9)
MCH: 30.3 pg (ref 26.6–33.0)
MCHC: 31.7 g/dL (ref 31.5–35.7)
MCV: 96 fL (ref 79–97)
Platelets: 281 x10E3/uL (ref 150–450)
RBC: 4.16 x10E6/uL (ref 3.77–5.28)
RDW: 12.5 % (ref 11.7–15.4)
WBC: 4.5 x10E3/uL (ref 3.4–10.8)

## 2024-09-17 LAB — HEMOGLOBIN A1C
Est. average glucose Bld gHb Est-mCnc: 117 mg/dL
Hgb A1c MFr Bld: 5.7 % — ABNORMAL HIGH (ref 4.8–5.6)

## 2024-09-17 LAB — LIPID PANEL
Chol/HDL Ratio: 3.2 ratio (ref 0.0–4.4)
Cholesterol, Total: 169 mg/dL (ref 100–199)
HDL: 53 mg/dL (ref >39)
LDL Chol Calc (NIH): 97 mg/dL (ref 0–99)
Triglycerides: 107 mg/dL (ref 0–149)
VLDL Cholesterol Cal: 19 mg/dL (ref 5–40)

## 2024-09-17 LAB — MICROALBUMIN / CREATININE URINE RATIO
Creatinine, Urine: 129.1 mg/dL
Microalb/Creat Ratio: 3 mg/g{creat} (ref 0–29)
Microalbumin, Urine: 4 ug/mL

## 2024-09-17 LAB — CYCLIC CITRUL PEPTIDE ANTIBODY, IGG/IGA: Cyclic Citrullin Peptide Ab: 5 U (ref 0–19)

## 2024-09-17 LAB — URIC ACID: Uric Acid: 4.8 mg/dL (ref 3.1–7.9)

## 2024-09-17 LAB — RHEUMATOID FACTOR: Rheumatoid fact SerPl-aCnc: <10 [IU]/mL (ref <14.0)

## 2024-09-17 LAB — ANTINUCLEAR ANTIBODIES, IFA: ANA Titer 1: NEGATIVE

## 2024-09-17 LAB — SEDIMENTATION RATE: Sed Rate: 22 mm/h (ref 0–40)

## 2024-09-21 DIAGNOSIS — M79601 Pain in right arm: Secondary | ICD-10-CM | POA: Insufficient documentation

## 2024-09-21 DIAGNOSIS — G4730 Sleep apnea, unspecified: Secondary | ICD-10-CM | POA: Insufficient documentation

## 2024-09-21 DIAGNOSIS — H6122 Impacted cerumen, left ear: Secondary | ICD-10-CM | POA: Insufficient documentation

## 2024-09-21 DIAGNOSIS — M2550 Pain in unspecified joint: Secondary | ICD-10-CM | POA: Insufficient documentation

## 2024-09-21 DIAGNOSIS — Z0000 Encounter for general adult medical examination without abnormal findings: Secondary | ICD-10-CM | POA: Insufficient documentation

## 2024-09-21 DIAGNOSIS — R7303 Prediabetes: Secondary | ICD-10-CM | POA: Insufficient documentation

## 2024-09-21 NOTE — Assessment & Plan Note (Signed)
 Chronic osteoarthritis with worsening hand pain and recurrent carpal tunnel symptoms. Possible autoimmune arthritis considered. - Order autoimmune arthritis panel. - Refer to hand specialist for evaluation of recurrent carpal tunnel symptoms. - Recommend adding cinnamon to diet for anti-inflammatory benefits.

## 2024-09-21 NOTE — Assessment & Plan Note (Signed)
 Mild sleep apnea with recommendation to sleep on the side. Weight loss may reduce CPAP need. - Encourage weight loss. - Advise sleeping on the side to reduce apnea episodes.

## 2024-09-21 NOTE — Assessment & Plan Note (Addendum)
 She is encouraged to strive for BMI less than 30 to decrease cardiac risk. Advised to aim for at least 150 minutes of exercise per week.  Obesity with recent weight gain impacting clinical study eligibility. Weight loss crucial for health improvement. - Encourage weight loss through diet and exercise. - Discuss potential benefits of weight loss on sleep apnea and overall health.

## 2024-09-21 NOTE — Assessment & Plan Note (Signed)
 Hypercholesterolemia managed with atorvastatin . Compliance with medication is questioned. - Continue atorvastatin . - Encourage medication compliance. - Consider rechecking cholesterol levels if medication adherence changes.

## 2024-09-21 NOTE — Assessment & Plan Note (Signed)

## 2024-09-21 NOTE — Assessment & Plan Note (Signed)
 Previous labs reviewed, her A1c has been elevated in the past. I will check an A1c today. Reminded to avoid refined sugars including sugary drinks/foods and processed meats including bacon, sausages and deli meats.

## 2024-09-21 NOTE — Assessment & Plan Note (Signed)
 Chronic, well controlled.  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  Well-controlled with lisinopril  and hydrochlorothiazide . - Continue lisinopril  and hydrochlorothiazide  daily. - Switch hydrochlorothiazide  to 90-day supply. - Encourage consistent medication timing. - Regular home blood pressure monitoring. - Follow low sodium diet.  - Folllow up in six months.

## 2024-09-21 NOTE — Assessment & Plan Note (Signed)
 Chronic, she is encouraged to stay well hydrated, avoid NSAIDs and keep BP controlled to prevent progression of CKD.

## 2024-09-21 NOTE — Assessment & Plan Note (Signed)
 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  - Consider NCS

## 2024-09-21 NOTE — Assessment & Plan Note (Signed)
 Impacted cerumen in the left ear requiring removal. - Perform ear irrigation to remove impacted cerumen.

## 2024-10-27 ENCOUNTER — Ambulatory Visit: Admitting: Neurology

## 2024-10-27 ENCOUNTER — Encounter: Payer: Self-pay | Admitting: Neurology

## 2024-10-27 VITALS — BP 136/83 | HR 63 | Ht 67.0 in

## 2024-10-27 DIAGNOSIS — M5412 Radiculopathy, cervical region: Secondary | ICD-10-CM | POA: Diagnosis not present

## 2024-10-27 DIAGNOSIS — M25531 Pain in right wrist: Secondary | ICD-10-CM | POA: Diagnosis not present

## 2024-10-27 DIAGNOSIS — G5712 Meralgia paresthetica, left lower limb: Secondary | ICD-10-CM

## 2024-10-27 NOTE — Patient Instructions (Signed)
 Nerve testing of the right hand  Follow-up with Dr. Camella INGLES AND NERVE CONDUCTION STUDIES (EMG/NCS) INSTRUCTIONS  How to Prepare The neurologist conducting the EMG will need to know if you have certain medical conditions. Tell the neurologist and other EMG lab personnel if you: Have a pacemaker or any other electrical medical device Take blood-thinning medications Have hemophilia, a blood-clotting disorder that causes prolonged bleeding Bathing Take a shower or bath shortly before your exam in order to remove oils from your skin. Dont apply lotions or creams before the exam.  What to Expect Youll likely be asked to change into a hospital gown for the procedure and lie down on an examination table. The following explanations can help you understand what will happen during the exam.  Electrodes. The neurologist or a technician places surface electrodes at various locations on your skin depending on where youre experiencing symptoms. Or the neurologist may insert needle electrodes at different sites depending on your symptoms.  Sensations. The electrodes will at times transmit a tiny electrical current that you may feel as a twinge or spasm. The needle electrode may cause discomfort or pain that usually ends shortly after the needle is removed. If you are concerned about discomfort or pain, you may want to talk to the neurologist about taking a short break during the exam.  Instructions. During the needle EMG, the neurologist will assess whether there is any spontaneous electrical activity when the muscle is at rest - activity that isnt present in healthy muscle tissue - and the degree of activity when you slightly contract the muscle.  He or she will give you instructions on resting and contracting a muscle at appropriate times. Depending on what muscles and nerves the neurologist is examining, he or she may ask you to change positions during the exam.  After your EMG You may  experience some temporary, minor bruising where the needle electrode was inserted into your muscle. This bruising should fade within several days. If it persists, contact your primary care doctor.

## 2024-10-27 NOTE — Progress Notes (Signed)
 Follow-up Visit   Date: 10/27/2024    [name] MRN: [id number] DOB: [birth year]    [name] is a 78 y.o. right-handed female with hypertension, hyperlipidemia, and fibromyalgia returning to the clinic for follow-up of bilateral hand pain.  The patient was accompanied to the clinic by self.   IMPRESSION/PLAN: Assessment & Plan Right wrist and hand pain with history of carpal tunnel release and osteoarthritis.  Intermittent burning pain above the wrist, overlying surgical scar over the lateral distal forearm/wrist. Previous carpal tunnel release provided temporary relief. Differential includes scar tissue or post-surgical changes. No numbness or tingling, not carpal tunnel syndrome. - Scheduled EMG for right hand with two-month wait. - Advised follow-up with hand specialist, Dr. Camella. - Instructed to cancel EMG if deemed unnecessary by hand specialist.  2.  Left cervical radiculopathy, improved with PT  3.  Left meralgia paresthetica, unchanged.    - She is working on trying to loose weight   Return to clinic as needed  --------------------------------------------- History of present illness: Starting around 2022, she began having numbness and burning involving the left lateral thigh.  It is worse at night when she is laying down.  Symptoms are not bothersome during the daytime, unless she has been sitting for a long time. She has gained 20lb over the past year.   No leg weakness, falls, or imbalance.  She walks unassisted.   For the past 6-8 months, she has numbness involving the left arm over the lateral upper arm that radiates down the forearm and the back of the hand/fingers  It occurs intermittently throughout the day.  She has chronic neck pain.  She had not done any physical therapy.  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  She works as a scientist, research (physical sciences). Lives alone.  UPDATE 10/27/2024:   Discussed the use of AI scribe software for clinical note transcription with the patient, who gave verbal consent to proceed.  History of Present Illness She experiences an intense burning sensation localized above the right wrist, and also reports burning at the base of the thumb and in the hand. This sensation can wake her at night and occurs intermittently throughout the week, lasting for minutes. Massaging the area provides some relief.  Approximately a year ago, she underwent carpal tunnel surgery on her right hand, which initially alleviated her symptoms for about three to four months before they returned and worsened. The surgery involved an incision on the hand and was intended to address both carpal tunnel syndrome and arthritis at the base of the thumb. Despite the surgery, she continues to experience severe pain and burning.  She has difficulty making a fist with her right hand and experiences significant pain, which she associates with arthritis. There is weakness in the right hand, leading to dropping objects, but no numbness or tingling in the hand.  In addition to her hand symptoms, she has a history of neck pain, previously treated with physical therapy and dry needling, resulting in improvement. She also has persistent numbness in her left thigh due to meralgia paresthetica.   Medications:  Current Outpatient Medications on File Prior to Visit  Medication Sig Dispense Refill   acetaminophen  (TYLENOL ) 500 MG tablet Take 1,000 mg by mouth daily as needed for moderate pain.     atorvastatin  (LIPITOR) 10 MG tablet TAKE 1 TABLET(10 MG) BY MOUTH DAILY 90 tablet 1   Black Pepper-Turmeric (TURMERIC COMPLEX/BLACK  PEPPER PO) Take 1 tablet by mouth daily.     diclofenac  Sodium (VOLTAREN ) 1 % GEL Apply topically 4 (four) times daily.     hydrochlorothiazide  (HYDRODIURIL ) 25 MG tablet TAKE 1 TABLET(25 MG) BY MOUTH DAILY 90 tablet 2   lisinopril  (ZESTRIL ) 10 MG tablet TAKE 1 TABLET(10 MG) BY  MOUTH DAILY 90 tablet 1   Multiple Vitamin (MULTIVITAMIN) capsule Take 1 capsule by mouth daily.     No current facility-administered medications on file prior to visit.    Allergies:  Allergies  Allergen Reactions   Penicillins     Does not remember    Vital Signs:  BP 136/83   Pulse 63   Ht 5' 7 (1.702 m)   SpO2 99%   BMI 36.34 kg/m   Neurological Exam: MENTAL STATUS including orientation to time, place, person, recent and remote memory, attention span and concentration, language, and fund of knowledge is normal.  Speech is not dysarthric.  CRANIAL NERVES:  Pupils equal round and reactive to light.  Normal conjugate, extra-ocular eye movements in all directions of gaze.  No ptosis.  Face is symmetric.   MOTOR:  Motor strength is 5/5 in all extremities, except ROM of finger flexion is limited, there is no weakness (?arthritis) .  No atrophy, fasciculations or abnormal movements.  No pronator drift.  Tone is normal.    MSRs:  Reflexes are 2+/4 throughout.  Tinel's sign is negative at the wrists bilaterally.  SENSORY:  Intact to vibration and temperature throughout, including in the hands.  COORDINATION/GAIT:  Gait narrow based and stable.   Data: NCS/EMG of the left arm 10/26/2019: This is a normal study of the left upper extremity.  In particular, there is no evidence of polyradiculoneuropathy, sensorimotor polyneuropathy, or cervical radiculopathy.   NCS/EMG of the arms 01/21/2023:  Bilateral median mononeuropathies across the wrists, mild on the left and moderate on the right.  Isolated mild left ulnar sensory neuropathy.   Lab Results  Component Value Date   TSH 1.450 12/04/2023   Lab Results  Component Value Date   HGBA1C 5.7 (H) 09/15/2024     Thank you for allowing me to participate in patient's care.  If I can answer any additional questions, I would be pleased to do so.    Sincerely,    Dekendrick Uzelac K. Tobie, DO

## 2024-11-10 ENCOUNTER — Ambulatory Visit: Admitting: Neurology

## 2024-12-16 ENCOUNTER — Encounter: Admitting: Neurology

## 2025-02-04 ENCOUNTER — Encounter: Admitting: Neurology

## 2025-03-15 ENCOUNTER — Ambulatory Visit: Payer: Self-pay | Admitting: Internal Medicine

## 2025-07-06 ENCOUNTER — Ambulatory Visit: Payer: Self-pay

## 2025-09-26 ENCOUNTER — Encounter: Payer: Self-pay | Admitting: Internal Medicine
